# Patient Record
Sex: Female | Born: 1960 | Race: White | Hispanic: No | Marital: Married | Smoking: Former smoker
Health system: Southern US, Community
[De-identification: ages and names within clinical notes are randomized; demographics above are authoritative.]

## PROBLEM LIST (undated history)

## (undated) DIAGNOSIS — E079 Disorder of thyroid, unspecified: Secondary | ICD-10-CM

## (undated) DIAGNOSIS — F1010 Alcohol abuse, uncomplicated: Secondary | ICD-10-CM

## (undated) DIAGNOSIS — R5382 Chronic fatigue, unspecified: Secondary | ICD-10-CM

## (undated) DIAGNOSIS — K219 Gastro-esophageal reflux disease without esophagitis: Secondary | ICD-10-CM

## (undated) DIAGNOSIS — N958 Other specified menopausal and perimenopausal disorders: Secondary | ICD-10-CM

## (undated) DIAGNOSIS — J45909 Unspecified asthma, uncomplicated: Secondary | ICD-10-CM

## (undated) DIAGNOSIS — M797 Fibromyalgia: Secondary | ICD-10-CM

## (undated) DIAGNOSIS — E785 Hyperlipidemia, unspecified: Secondary | ICD-10-CM

## (undated) DIAGNOSIS — G4730 Sleep apnea, unspecified: Secondary | ICD-10-CM

## (undated) DIAGNOSIS — L930 Discoid lupus erythematosus: Secondary | ICD-10-CM

## (undated) DIAGNOSIS — G9332 Myalgic encephalomyelitis/chronic fatigue syndrome: Secondary | ICD-10-CM

## (undated) HISTORY — DX: Chronic fatigue, unspecified: R53.82

## (undated) HISTORY — DX: Unspecified asthma, uncomplicated: J45.909

## (undated) HISTORY — PX: OTHER SURGICAL HISTORY: SHX169

## (undated) HISTORY — PX: NASAL SEPTUM SURGERY: SHX37

## (undated) HISTORY — DX: Discoid lupus erythematosus: L93.0

## (undated) HISTORY — PX: TUBAL LIGATION: SHX77

## (undated) HISTORY — DX: Sleep apnea, unspecified: G47.30

## (undated) HISTORY — DX: Disorder of thyroid, unspecified: E07.9

## (undated) HISTORY — DX: Other specified menopausal and perimenopausal disorders: N95.8

## (undated) HISTORY — DX: Gastro-esophageal reflux disease without esophagitis: K21.9

## (undated) HISTORY — DX: Myalgic encephalomyelitis/chronic fatigue syndrome: G93.32

## (undated) HISTORY — DX: Alcohol abuse, uncomplicated: F10.10

## (undated) HISTORY — DX: Hyperlipidemia, unspecified: E78.5

## (undated) HISTORY — DX: Fibromyalgia: M79.7

---

## 2005-10-19 ENCOUNTER — Ambulatory Visit (HOSPITAL_COMMUNITY): Payer: Self-pay | Admitting: Psychiatry

## 2006-01-04 ENCOUNTER — Ambulatory Visit (HOSPITAL_COMMUNITY): Payer: Self-pay | Admitting: Psychiatry

## 2006-01-20 ENCOUNTER — Ambulatory Visit (HOSPITAL_COMMUNITY): Payer: Self-pay | Admitting: Psychiatry

## 2006-03-22 ENCOUNTER — Ambulatory Visit (HOSPITAL_COMMUNITY): Payer: Self-pay | Admitting: Psychiatry

## 2006-04-06 ENCOUNTER — Ambulatory Visit: Payer: Self-pay | Admitting: Family Medicine

## 2006-04-06 DIAGNOSIS — M797 Fibromyalgia: Secondary | ICD-10-CM | POA: Insufficient documentation

## 2006-04-13 ENCOUNTER — Telehealth: Payer: Self-pay | Admitting: Family Medicine

## 2006-04-17 ENCOUNTER — Ambulatory Visit: Payer: Self-pay | Admitting: Family Medicine

## 2006-04-17 DIAGNOSIS — N329 Bladder disorder, unspecified: Secondary | ICD-10-CM | POA: Insufficient documentation

## 2006-04-17 DIAGNOSIS — K644 Residual hemorrhoidal skin tags: Secondary | ICD-10-CM | POA: Insufficient documentation

## 2006-04-17 DIAGNOSIS — N8110 Cystocele, unspecified: Secondary | ICD-10-CM | POA: Insufficient documentation

## 2006-04-18 ENCOUNTER — Telehealth (INDEPENDENT_AMBULATORY_CARE_PROVIDER_SITE_OTHER): Payer: Self-pay | Admitting: *Deleted

## 2006-04-18 ENCOUNTER — Encounter: Payer: Self-pay | Admitting: Family Medicine

## 2006-04-18 LAB — CONVERTED CEMR LAB: Trich, Wet Prep: NONE SEEN

## 2006-04-19 ENCOUNTER — Ambulatory Visit (HOSPITAL_COMMUNITY): Payer: Self-pay | Admitting: Psychiatry

## 2006-04-21 DIAGNOSIS — E039 Hypothyroidism, unspecified: Secondary | ICD-10-CM | POA: Insufficient documentation

## 2006-04-21 DIAGNOSIS — F3289 Other specified depressive episodes: Secondary | ICD-10-CM | POA: Insufficient documentation

## 2006-04-21 DIAGNOSIS — F329 Major depressive disorder, single episode, unspecified: Secondary | ICD-10-CM

## 2006-05-03 ENCOUNTER — Encounter: Payer: Self-pay | Admitting: Family Medicine

## 2006-05-05 ENCOUNTER — Telehealth: Payer: Self-pay | Admitting: Family Medicine

## 2006-05-12 ENCOUNTER — Telehealth: Payer: Self-pay | Admitting: Family Medicine

## 2006-05-30 ENCOUNTER — Telehealth (INDEPENDENT_AMBULATORY_CARE_PROVIDER_SITE_OTHER): Payer: Self-pay | Admitting: *Deleted

## 2006-06-01 ENCOUNTER — Ambulatory Visit: Payer: Self-pay | Admitting: Family Medicine

## 2006-06-01 DIAGNOSIS — F1011 Alcohol abuse, in remission: Secondary | ICD-10-CM | POA: Insufficient documentation

## 2006-06-01 LAB — CONVERTED CEMR LAB
BUN: 15 mg/dL (ref 6–23)
Calcium: 9.4 mg/dL (ref 8.4–10.5)
Folate: 11.4 ng/mL
Glucose, Bld: 98 mg/dL (ref 70–99)
MCHC: 34.5 g/dL (ref 30.0–36.0)
MCV: 98.7 fL (ref 78.0–100.0)
Potassium: 4.4 meq/L (ref 3.5–5.3)
RBC: 4.58 M/uL (ref 3.87–5.11)
RDW: 14.3 % — ABNORMAL HIGH (ref 11.5–14.0)
Sodium: 140 meq/L (ref 135–145)
TSH: 13.662 microintl units/mL — ABNORMAL HIGH (ref 0.350–5.50)

## 2006-06-05 ENCOUNTER — Telehealth: Payer: Self-pay | Admitting: Family Medicine

## 2006-06-05 ENCOUNTER — Encounter: Payer: Self-pay | Admitting: Family Medicine

## 2006-06-05 LAB — CONVERTED CEMR LAB: Vitamin B-12: 217 pg/mL (ref 211–911)

## 2006-06-06 ENCOUNTER — Telehealth (INDEPENDENT_AMBULATORY_CARE_PROVIDER_SITE_OTHER): Payer: Self-pay | Admitting: *Deleted

## 2006-06-15 ENCOUNTER — Telehealth (INDEPENDENT_AMBULATORY_CARE_PROVIDER_SITE_OTHER): Payer: Self-pay | Admitting: *Deleted

## 2006-06-16 ENCOUNTER — Ambulatory Visit: Payer: Self-pay | Admitting: Family Medicine

## 2006-06-16 DIAGNOSIS — E538 Deficiency of other specified B group vitamins: Secondary | ICD-10-CM | POA: Insufficient documentation

## 2006-08-04 ENCOUNTER — Ambulatory Visit: Payer: Self-pay | Admitting: Family Medicine

## 2006-08-05 ENCOUNTER — Encounter: Payer: Self-pay | Admitting: Family Medicine

## 2006-08-07 ENCOUNTER — Telehealth: Payer: Self-pay | Admitting: Family Medicine

## 2006-08-07 ENCOUNTER — Telehealth (INDEPENDENT_AMBULATORY_CARE_PROVIDER_SITE_OTHER): Payer: Self-pay | Admitting: *Deleted

## 2006-08-29 ENCOUNTER — Encounter: Payer: Self-pay | Admitting: Family Medicine

## 2006-09-12 ENCOUNTER — Encounter: Payer: Self-pay | Admitting: Family Medicine

## 2006-09-19 ENCOUNTER — Encounter: Payer: Self-pay | Admitting: Family Medicine

## 2006-09-22 ENCOUNTER — Ambulatory Visit: Payer: Self-pay | Admitting: Family Medicine

## 2006-09-22 LAB — CONVERTED CEMR LAB
Blood in Urine, dipstick: NEGATIVE
Ketones, urine, test strip: NEGATIVE
Nitrite: NEGATIVE
Urobilinogen, UA: NEGATIVE
WBC Urine, dipstick: NEGATIVE

## 2006-09-26 ENCOUNTER — Inpatient Hospital Stay (HOSPITAL_COMMUNITY): Admission: RE | Admit: 2006-09-26 | Discharge: 2006-09-28 | Payer: Self-pay | Admitting: Obstetrics and Gynecology

## 2006-09-26 ENCOUNTER — Encounter (INDEPENDENT_AMBULATORY_CARE_PROVIDER_SITE_OTHER): Payer: Self-pay | Admitting: Obstetrics and Gynecology

## 2006-10-02 ENCOUNTER — Telehealth (INDEPENDENT_AMBULATORY_CARE_PROVIDER_SITE_OTHER): Payer: Self-pay | Admitting: *Deleted

## 2006-10-24 ENCOUNTER — Encounter: Payer: Self-pay | Admitting: Family Medicine

## 2006-10-24 ENCOUNTER — Inpatient Hospital Stay (HOSPITAL_COMMUNITY): Admission: AD | Admit: 2006-10-24 | Discharge: 2006-10-24 | Payer: Self-pay | Admitting: Radiology

## 2006-10-24 LAB — CONVERTED CEMR LAB
ALT: 53 units/L
AST: 50 units/L
Albumin: 3.7 g/dL
Alkaline Phosphatase: 70 units/L
Calcium: 9.5 mg/dL
Chloride: 105 meq/L
HCT: 41.1 %
Platelets: 205 10*3/uL
Potassium: 4.5 meq/L
Sodium: 139 meq/L
Total Protein: 6.5 g/dL
WBC, blood: 6.2 10*3/uL

## 2006-10-27 ENCOUNTER — Ambulatory Visit: Payer: Self-pay | Admitting: Family Medicine

## 2006-10-28 ENCOUNTER — Encounter: Payer: Self-pay | Admitting: Family Medicine

## 2006-10-28 LAB — CONVERTED CEMR LAB
Trich, Wet Prep: NONE SEEN
Yeast Wet Prep HPF POC: NONE SEEN

## 2006-10-30 ENCOUNTER — Telehealth (INDEPENDENT_AMBULATORY_CARE_PROVIDER_SITE_OTHER): Payer: Self-pay | Admitting: *Deleted

## 2006-10-30 ENCOUNTER — Encounter: Payer: Self-pay | Admitting: Family Medicine

## 2006-10-31 ENCOUNTER — Encounter: Payer: Self-pay | Admitting: Family Medicine

## 2006-10-31 ENCOUNTER — Ambulatory Visit (HOSPITAL_COMMUNITY): Payer: Self-pay | Admitting: Psychiatry

## 2006-11-07 ENCOUNTER — Telehealth (INDEPENDENT_AMBULATORY_CARE_PROVIDER_SITE_OTHER): Payer: Self-pay | Admitting: *Deleted

## 2006-11-13 ENCOUNTER — Telehealth (INDEPENDENT_AMBULATORY_CARE_PROVIDER_SITE_OTHER): Payer: Self-pay | Admitting: *Deleted

## 2006-11-24 ENCOUNTER — Ambulatory Visit: Payer: Self-pay | Admitting: Family Medicine

## 2006-11-27 ENCOUNTER — Ambulatory Visit (HOSPITAL_COMMUNITY): Payer: Self-pay | Admitting: Psychiatry

## 2006-12-13 ENCOUNTER — Encounter: Payer: Self-pay | Admitting: Family Medicine

## 2006-12-18 ENCOUNTER — Encounter: Payer: Self-pay | Admitting: Family Medicine

## 2006-12-20 ENCOUNTER — Ambulatory Visit: Payer: Self-pay | Admitting: Family Medicine

## 2006-12-21 ENCOUNTER — Ambulatory Visit (HOSPITAL_COMMUNITY): Payer: Self-pay | Admitting: Psychiatry

## 2007-01-16 ENCOUNTER — Encounter: Payer: Self-pay | Admitting: Family Medicine

## 2007-01-18 ENCOUNTER — Encounter: Admission: RE | Admit: 2007-01-18 | Discharge: 2007-01-18 | Payer: Self-pay | Admitting: Obstetrics and Gynecology

## 2007-01-18 ENCOUNTER — Ambulatory Visit (HOSPITAL_COMMUNITY): Payer: Self-pay | Admitting: Psychiatry

## 2007-01-18 LAB — HM MAMMOGRAPHY

## 2007-01-24 ENCOUNTER — Encounter: Payer: Self-pay | Admitting: Family Medicine

## 2007-02-13 ENCOUNTER — Encounter: Payer: Self-pay | Admitting: Family Medicine

## 2007-02-14 ENCOUNTER — Encounter: Payer: Self-pay | Admitting: Family Medicine

## 2007-02-20 ENCOUNTER — Telehealth: Payer: Self-pay | Admitting: Family Medicine

## 2007-02-20 ENCOUNTER — Telehealth (INDEPENDENT_AMBULATORY_CARE_PROVIDER_SITE_OTHER): Payer: Self-pay | Admitting: *Deleted

## 2007-02-21 ENCOUNTER — Ambulatory Visit (HOSPITAL_COMMUNITY): Payer: Self-pay | Admitting: Psychiatry

## 2007-02-23 ENCOUNTER — Telehealth: Payer: Self-pay | Admitting: Family Medicine

## 2007-03-19 ENCOUNTER — Encounter: Payer: Self-pay | Admitting: Family Medicine

## 2007-04-12 ENCOUNTER — Encounter: Payer: Self-pay | Admitting: Family Medicine

## 2007-04-12 DIAGNOSIS — L659 Nonscarring hair loss, unspecified: Secondary | ICD-10-CM | POA: Insufficient documentation

## 2007-04-18 ENCOUNTER — Ambulatory Visit (HOSPITAL_COMMUNITY): Payer: Self-pay | Admitting: Psychiatry

## 2007-05-18 ENCOUNTER — Telehealth (INDEPENDENT_AMBULATORY_CARE_PROVIDER_SITE_OTHER): Payer: Self-pay | Admitting: *Deleted

## 2007-05-29 ENCOUNTER — Telehealth: Payer: Self-pay | Admitting: Family Medicine

## 2007-06-22 ENCOUNTER — Telehealth: Payer: Self-pay | Admitting: Family Medicine

## 2007-06-25 ENCOUNTER — Ambulatory Visit: Payer: Self-pay | Admitting: Family Medicine

## 2007-06-25 ENCOUNTER — Encounter: Admission: RE | Admit: 2007-06-25 | Discharge: 2007-06-25 | Payer: Self-pay | Admitting: Family Medicine

## 2007-06-25 DIAGNOSIS — J4489 Other specified chronic obstructive pulmonary disease: Secondary | ICD-10-CM | POA: Insufficient documentation

## 2007-06-25 DIAGNOSIS — J449 Chronic obstructive pulmonary disease, unspecified: Secondary | ICD-10-CM | POA: Insufficient documentation

## 2007-06-26 ENCOUNTER — Ambulatory Visit (HOSPITAL_COMMUNITY): Payer: Self-pay | Admitting: Psychiatry

## 2007-07-02 ENCOUNTER — Ambulatory Visit: Payer: Self-pay | Admitting: Family Medicine

## 2007-07-03 ENCOUNTER — Encounter: Admission: RE | Admit: 2007-07-03 | Discharge: 2007-07-03 | Payer: Self-pay | Admitting: Family Medicine

## 2007-07-18 ENCOUNTER — Ambulatory Visit (HOSPITAL_COMMUNITY): Payer: Self-pay | Admitting: Psychiatry

## 2007-09-04 HISTORY — PX: ABDOMINAL HYSTERECTOMY: SHX81

## 2007-09-13 ENCOUNTER — Telehealth: Payer: Self-pay | Admitting: Family Medicine

## 2007-09-21 ENCOUNTER — Ambulatory Visit (HOSPITAL_COMMUNITY): Payer: Self-pay | Admitting: Psychiatry

## 2007-10-25 ENCOUNTER — Ambulatory Visit: Payer: Self-pay | Admitting: Family Medicine

## 2007-11-20 ENCOUNTER — Telehealth: Payer: Self-pay | Admitting: Family Medicine

## 2007-11-20 ENCOUNTER — Encounter: Payer: Self-pay | Admitting: Family Medicine

## 2007-11-21 ENCOUNTER — Telehealth: Payer: Self-pay | Admitting: Family Medicine

## 2008-03-12 ENCOUNTER — Ambulatory Visit: Payer: Self-pay | Admitting: Family Medicine

## 2008-03-12 ENCOUNTER — Encounter: Admission: RE | Admit: 2008-03-12 | Discharge: 2008-03-12 | Payer: Self-pay | Admitting: Family Medicine

## 2008-03-12 ENCOUNTER — Ambulatory Visit (HOSPITAL_COMMUNITY): Payer: Self-pay | Admitting: Psychiatry

## 2008-04-15 ENCOUNTER — Telehealth (INDEPENDENT_AMBULATORY_CARE_PROVIDER_SITE_OTHER): Payer: Self-pay | Admitting: *Deleted

## 2008-04-16 ENCOUNTER — Ambulatory Visit: Payer: Self-pay | Admitting: Family Medicine

## 2008-04-17 ENCOUNTER — Encounter: Payer: Self-pay | Admitting: Family Medicine

## 2008-04-17 LAB — CONVERTED CEMR LAB
ALT: 14 units/L (ref 0–35)
BUN: 11 mg/dL (ref 6–23)
Basophils Relative: 2 % — ABNORMAL HIGH (ref 0–1)
CO2: 22 meq/L (ref 19–32)
Calcium: 9.6 mg/dL (ref 8.4–10.5)
Chloride: 107 meq/L (ref 96–112)
Creatinine, Ser: 0.95 mg/dL (ref 0.40–1.20)
Eosinophils Absolute: 0.1 10*3/uL (ref 0.0–0.7)
Eosinophils Relative: 1 % (ref 0–5)
HCT: 42.3 % (ref 36.0–46.0)
MCHC: 35 g/dL (ref 30.0–36.0)
MCV: 91.4 fL (ref 78.0–100.0)
Monocytes Relative: 6 % (ref 3–12)
Neutrophils Relative %: 63 % (ref 43–77)
Platelets: 235 10*3/uL (ref 150–400)
TSH: 52.31 microintl units/mL — ABNORMAL HIGH (ref 0.350–4.500)
Total Bilirubin: 0.4 mg/dL (ref 0.3–1.2)

## 2008-04-22 ENCOUNTER — Telehealth: Payer: Self-pay | Admitting: Family Medicine

## 2008-04-23 ENCOUNTER — Encounter: Payer: Self-pay | Admitting: Family Medicine

## 2008-04-29 ENCOUNTER — Encounter: Payer: Self-pay | Admitting: Family Medicine

## 2008-04-30 ENCOUNTER — Encounter: Payer: Self-pay | Admitting: Family Medicine

## 2008-05-06 ENCOUNTER — Encounter: Payer: Self-pay | Admitting: Family Medicine

## 2008-05-13 ENCOUNTER — Telehealth: Payer: Self-pay | Admitting: Family Medicine

## 2008-06-06 ENCOUNTER — Encounter: Payer: Self-pay | Admitting: Family Medicine

## 2008-07-01 ENCOUNTER — Ambulatory Visit (HOSPITAL_COMMUNITY): Payer: Self-pay | Admitting: Psychiatry

## 2008-07-02 ENCOUNTER — Telehealth (INDEPENDENT_AMBULATORY_CARE_PROVIDER_SITE_OTHER): Payer: Self-pay | Admitting: *Deleted

## 2008-07-14 ENCOUNTER — Ambulatory Visit: Payer: Self-pay | Admitting: Family Medicine

## 2008-07-22 ENCOUNTER — Encounter: Payer: Self-pay | Admitting: Family Medicine

## 2008-07-25 ENCOUNTER — Telehealth (INDEPENDENT_AMBULATORY_CARE_PROVIDER_SITE_OTHER): Payer: Self-pay | Admitting: *Deleted

## 2008-08-26 ENCOUNTER — Ambulatory Visit (HOSPITAL_COMMUNITY): Payer: Self-pay | Admitting: Psychiatry

## 2008-10-21 ENCOUNTER — Encounter: Payer: Self-pay | Admitting: Family Medicine

## 2008-11-05 ENCOUNTER — Ambulatory Visit: Payer: Self-pay | Admitting: Family Medicine

## 2008-11-05 DIAGNOSIS — M2550 Pain in unspecified joint: Secondary | ICD-10-CM | POA: Insufficient documentation

## 2008-11-05 DIAGNOSIS — R030 Elevated blood-pressure reading, without diagnosis of hypertension: Secondary | ICD-10-CM | POA: Insufficient documentation

## 2008-11-05 DIAGNOSIS — N390 Urinary tract infection, site not specified: Secondary | ICD-10-CM | POA: Insufficient documentation

## 2008-11-05 LAB — CONVERTED CEMR LAB
Glucose, Urine, Semiquant: NEGATIVE
Ketones, urine, test strip: NEGATIVE
Specific Gravity, Urine: 1.02
Urobilinogen, UA: 0.2
pH: 5.5

## 2008-11-06 ENCOUNTER — Encounter: Payer: Self-pay | Admitting: Family Medicine

## 2008-11-11 ENCOUNTER — Telehealth: Payer: Self-pay | Admitting: Family Medicine

## 2008-11-12 LAB — CONVERTED CEMR LAB
Basophils Absolute: 0.1 10*3/uL (ref 0.0–0.1)
Eosinophils Relative: 1 % (ref 0–5)
HCT: 46.4 % — ABNORMAL HIGH (ref 36.0–46.0)
Hemoglobin: 15.7 g/dL — ABNORMAL HIGH (ref 12.0–15.0)
Lymphocytes Relative: 19 % (ref 12–46)
Lymphs Abs: 1.4 10*3/uL (ref 0.7–4.0)
Monocytes Absolute: 0.5 10*3/uL (ref 0.1–1.0)
Monocytes Relative: 6 % (ref 3–12)
Neutro Abs: 5.5 10*3/uL (ref 1.7–7.7)
RBC: 4.82 M/uL (ref 3.87–5.11)
Rhuematoid fact SerPl-aCnc: 22 intl units/mL — ABNORMAL HIGH (ref 0–20)
Sed Rate: 9 mm/hr (ref 0–22)
WBC: 7.6 10*3/uL (ref 4.0–10.5)
ds DNA Ab: <1 (ref <5)

## 2008-11-18 ENCOUNTER — Ambulatory Visit (HOSPITAL_COMMUNITY): Payer: Self-pay | Admitting: Psychiatry

## 2008-12-15 ENCOUNTER — Encounter: Payer: Self-pay | Admitting: Family Medicine

## 2009-01-30 ENCOUNTER — Ambulatory Visit (HOSPITAL_COMMUNITY): Payer: Self-pay | Admitting: Psychiatry

## 2009-02-18 ENCOUNTER — Ambulatory Visit: Payer: Self-pay | Admitting: Family Medicine

## 2009-02-18 DIAGNOSIS — H9190 Unspecified hearing loss, unspecified ear: Secondary | ICD-10-CM | POA: Insufficient documentation

## 2009-02-18 DIAGNOSIS — E559 Vitamin D deficiency, unspecified: Secondary | ICD-10-CM | POA: Insufficient documentation

## 2009-02-19 LAB — CONVERTED CEMR LAB
TSH: 0.289 microintl units/mL — ABNORMAL LOW (ref 0.350–4.500)
Vit D, 25-Hydroxy: 26 ng/mL — ABNORMAL LOW (ref 30–89)
Vitamin B-12: 474 pg/mL (ref 211–911)

## 2009-04-01 ENCOUNTER — Ambulatory Visit: Payer: Self-pay | Admitting: Family Medicine

## 2009-04-01 ENCOUNTER — Ambulatory Visit (HOSPITAL_COMMUNITY): Payer: Self-pay | Admitting: Psychiatry

## 2009-05-25 ENCOUNTER — Telehealth (INDEPENDENT_AMBULATORY_CARE_PROVIDER_SITE_OTHER): Payer: Self-pay | Admitting: *Deleted

## 2009-06-05 ENCOUNTER — Telehealth: Payer: Self-pay | Admitting: Family Medicine

## 2009-06-10 ENCOUNTER — Encounter: Payer: Self-pay | Admitting: Family Medicine

## 2009-06-24 ENCOUNTER — Ambulatory Visit: Payer: Self-pay | Admitting: Emergency Medicine

## 2009-06-24 DIAGNOSIS — M25579 Pain in unspecified ankle and joints of unspecified foot: Secondary | ICD-10-CM | POA: Insufficient documentation

## 2009-06-24 DIAGNOSIS — S82409A Unspecified fracture of shaft of unspecified fibula, initial encounter for closed fracture: Secondary | ICD-10-CM | POA: Insufficient documentation

## 2009-06-24 DIAGNOSIS — S93409A Sprain of unspecified ligament of unspecified ankle, initial encounter: Secondary | ICD-10-CM | POA: Insufficient documentation

## 2009-07-02 ENCOUNTER — Ambulatory Visit (HOSPITAL_BASED_OUTPATIENT_CLINIC_OR_DEPARTMENT_OTHER): Admission: RE | Admit: 2009-07-02 | Discharge: 2009-07-02 | Payer: Self-pay | Admitting: Orthopedic Surgery

## 2009-07-08 ENCOUNTER — Ambulatory Visit: Payer: Self-pay

## 2009-07-08 ENCOUNTER — Encounter: Payer: Self-pay | Admitting: Cardiovascular Disease

## 2009-07-08 DIAGNOSIS — M79609 Pain in unspecified limb: Secondary | ICD-10-CM | POA: Insufficient documentation

## 2009-08-17 ENCOUNTER — Telehealth (INDEPENDENT_AMBULATORY_CARE_PROVIDER_SITE_OTHER): Payer: Self-pay | Admitting: *Deleted

## 2009-11-16 ENCOUNTER — Ambulatory Visit (HOSPITAL_COMMUNITY): Payer: Self-pay | Admitting: Psychiatry

## 2009-11-19 ENCOUNTER — Encounter: Admission: RE | Admit: 2009-11-19 | Discharge: 2009-11-19 | Payer: Self-pay | Admitting: Family Medicine

## 2009-11-19 ENCOUNTER — Ambulatory Visit: Payer: Self-pay | Admitting: Family Medicine

## 2009-11-19 DIAGNOSIS — R05 Cough: Secondary | ICD-10-CM | POA: Insufficient documentation

## 2009-11-19 DIAGNOSIS — R062 Wheezing: Secondary | ICD-10-CM | POA: Insufficient documentation

## 2009-11-19 DIAGNOSIS — R059 Cough, unspecified: Secondary | ICD-10-CM | POA: Insufficient documentation

## 2009-11-19 DIAGNOSIS — J069 Acute upper respiratory infection, unspecified: Secondary | ICD-10-CM | POA: Insufficient documentation

## 2009-11-25 ENCOUNTER — Telehealth: Payer: Self-pay | Admitting: Family Medicine

## 2009-11-30 ENCOUNTER — Encounter: Payer: Self-pay | Admitting: Family Medicine

## 2010-01-06 ENCOUNTER — Ambulatory Visit (HOSPITAL_COMMUNITY)
Admission: RE | Admit: 2010-01-06 | Discharge: 2010-01-06 | Payer: Self-pay | Source: Home / Self Care | Attending: Psychiatry | Admitting: Psychiatry

## 2010-02-02 NOTE — Progress Notes (Signed)
Summary: Support group  Phone Note Call from Patient   Caller: Patient Summary of Call: Pt states she would like info on joining a support group bc she needs help dealing w/ her bipolar and suicidal daughter. Do you have any suggestions? Initial call taken by: Payton Spark CMA,  August 17, 2009 2:47 PM  Follow-up for Phone Call        can you ask downstairs at behavioral health? Follow-up by: Seymour Bars DO,  August 17, 2009 3:15 PM  Additional Follow-up for Phone Call Additional follow up Details #1::        BH downstairs will be happy to see Pt for counseling, coping skills and other issues.   I called Pt to inform her of the above. Pt states she will wait to see if she can find a actual group of other parents she can meet with. Pt will CB if she needs further help. Additional Follow-up by: Payton Spark CMA,  August 17, 2009 3:29 PM

## 2010-02-02 NOTE — Assessment & Plan Note (Signed)
Summary: INJURY TO ANKLE/KH   Vital Signs:  Patient Profile:   50 Years Old Female CC:      Rt ankle pain from fall down stairs last night Height:     65 inches Weight:      182 pounds O2 Sat:      94 % O2 treatment:    Room Air Temp:     98.3 degrees F oral Pulse rate:   85 / minute Pulse rhythm:   regular Resp:     18 per minute BP sitting:   135 / 84  (right arm) Cuff size:   regular  Pt. in pain?   yes    Location:   ankle    Intensity:   7    Type:       sharp  Vitals Entered By: Emilio Math (June 24, 2009 12:34 PM)                   Current Allergies (reviewed today): ! VICODINHistory of Present Illness Chief Complaint: Rt ankle pain from fall down stairs last night History of Present Illness: Larey Seat down 3 stairs last night.  Doesn't recall how her ankle twisted, but she had to lay there 15 minutes before being helped back to bed by her husband.  She used ice which she believes has decreased the swelling.  Right lateral ankle swelling and bruising.  She is able to bear weight on it now. Throbbing dull pain.  Cannot take narcotics nor medicines with Tylenol.  Has taken Tramadol before.  Current Meds CLONAZEPAM 1 MG TABS (CLONAZEPAM) one by mouth two times a day SYNTHROID 150 MCG  TABS (LEVOTHYROXINE SODIUM) 1 tab by mouth daily TRAZODONE HCL 50 MG TABS (TRAZODONE HCL) Take 1 tablet by mouth once a day at bedtime PAROXETINE HCL 40 MG TABS (PAROXETINE HCL) Take 1/2 tab daily * FOLIC ACID  ESTRADIOL 0.1 MG/24HR PTWK (ESTRADIOL) Take 1 tab by mouth daily LAMOTRIGINE 25 MG TABS (LAMOTRIGINE) Take 2 tabs every morning and 4 tabs every night GABAPENTIN 300 MG CAPS (GABAPENTIN) Take 4 caps by mouth once daily VERAMYST 27.5 MCG/SPRAY SUSP (FLUTICASONE FUROATE) 2 sprays/ nostril once a day TRAMADOL HCL 50 MG TABS (TRAMADOL HCL) 1 tab by mouth Q6 hours as needed pain  REVIEW OF SYSTEMS Constitutional Symptoms      Denies fever, chills, night sweats, weight loss, weight  gain, and fatigue.  Eyes       Denies change in vision, eye pain, eye discharge, glasses, contact lenses, and eye surgery. Ear/Nose/Throat/Mouth       Denies hearing loss/aids, change in hearing, ear pain, ear discharge, dizziness, frequent runny nose, frequent nose bleeds, sinus problems, sore throat, hoarseness, and tooth pain or bleeding.  Respiratory       Denies dry cough, productive cough, wheezing, shortness of breath, asthma, bronchitis, and emphysema/COPD.  Cardiovascular       Denies murmurs, chest pain, and tires easily with exhertion.    Gastrointestinal       Denies stomach pain, nausea/vomiting, diarrhea, constipation, blood in bowel movements, and indigestion. Genitourniary       Denies painful urination, kidney stones, and loss of urinary control. Neurological       Denies paralysis, seizures, and fainting/blackouts. Musculoskeletal       Complains of muscle pain, joint pain, joint stiffness, decreased range of motion, and swelling.      Denies redness, muscle weakness, and gout.  Skin       Complains  of bruising.      Denies unusual mles/lumps or sores and hair/skin or nail changes.  Psych       Denies mood changes, temper/anger issues, anxiety/stress, speech problems, depression, and sleep problems.  Past History:  Past Medical History: Reviewed history from 06/25/2007 and no changes required. 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 suicide attempts x 2- Dr Christell Constant, psych perimenopausal fibromyalgia- Dr deveswar-- not seeing anymorer, chiropractor CFS [phone] gyn : Dr Edward Jolly derm: Dr Isaac Laud -[facility], Dr Naoma Diener in [place] [facility]:  Previous alcholic.   Past Surgical History: Reviewed history from 10/27/2006 and no changes required. BTL uvuloplasty 1993 colonoscopy/EGD normal 2006 TAH/ bladder sling 9-08  Family History: Reviewed history from 04/06/2006 and no changes required. sister died at 70 from AMI father  alive, HTN brother alive, depression brother (younger) died, AIDs, comitted suicide mother died at 7 from colon cancer  Social History: Reviewed history from 04/21/2006 and no changes required. 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  Married, happily to [place]. 4 grown kids.  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 Physical Exam General appearance: well developed, well nourished, no acute distress Chest/Lungs: no rales, wheezes, or rhonchi bilateral, breath sounds equal without effort Heart: regular rate and  rhythm, no murmur Extremities: Right ankle: FROM, full strength, 2+ effusion and ecchymoses lateral ankle.  TTP ATFL, lat malleolus, and with squeezing of tib-fib.  No TTP med malleolus, prox fib, base of 5th, navicular, or calcaneus.   Skin: no obvious rashes or lesions Assessment New Problems: FRACTURE, FIBULA, RIGHT (ICD-823.81) ANKLE PAIN, RIGHT (ICD-719.47) SPRAIN/STRAIN, ANKLE NOS (ICD-845.00)  Xray: spiral R fibula fx  Plan New Medications/Changes: TRAMADOL HCL 50 MG TABS (TRAMADOL HCL) 1 tab by mouth Q6 hours as needed pain  #20 x 0, 06/24/2009, Hoyt Koch MD  New Orders: T-DG Ankle Complete*R* [14782] New Patient Level III [95621] Ace  Bandage < 3in. [H0865]  The patient and/or caregiver has been counseled thoroughly with regard to medications prescribed including dosage, schedule, interactions, rationale for use, and possible side effects and they verbalize understanding.  Diagnoses and expected course of recovery discussed and will return if not improved as expected or if the condition worsens. Patient and/or caregiver verbalized understanding.  Prescriptions: TRAMADOL HCL 50 MG TABS (TRAMADOL HCL) 1 tab by mouth Q6 hours as needed pain  #20 x 0   Entered and Authorized by:   Hoyt Koch MD   Signed by:   Hoyt Koch MD on 06/24/2009   Method used:   Printed  then faxed to ...       [facility]* (retail)       [address] [place], [place]  [address]       Ph: [phone]       Fax: [phone]   RxID:   [id number]   Patient Instructions: 1)  Rest, ice, compression, elevation 2)  Medication as instructed 3)  Avoid weight-bearing if at all possible 4)  Wear splint 5)  Follow up with Dr. Margaretha Sheffield tomorrow  Orders Added: 1)  T-DG Ankle Complete*R* [73610] 2)  New Patient Level III [99203] 3)  Ace  Bandage < 3in. [V5643]

## 2010-02-02 NOTE — Assessment & Plan Note (Signed)
Summary: BP check  Nurse Visit   Vital Signs:  Patient profile:   50 year old female O2 Sat:      96 % on Room air Pulse rate:   70 / minute BP sitting:   137 / 89  (left arm) Cuff size:   regular  Vitals Entered By: Payton Spark CMA (April 01, 2009 2:38 PM)  O2 Flow:  Room air  Allergies: 1)  ! Vicodin  Orders Added: 1)  Est. Patient Level I [19147]   CC: Follow-up HTN HPI: Taking meds? n/a Side effects? n/a Chest pain, SOB, Dizziness? no A/P: HTN (401.1) At goal? If no, Patient will be notified.  5 minutes was spent with patient.   Pt was seen by Dr. Christell Constant and sent up here for elevated BP. Pt denies chest pain, SOB, dizziness and any other Sxs. Pt feels fine but admits she has been more moody and on edge.

## 2010-02-02 NOTE — Progress Notes (Signed)
Summary: testing?  Phone Note Call from Patient   Caller: Patient Summary of Call: Pt states she gave a bleeding baby CPR and mouth to mouth. Pt was told by EMS that she should check w/ her PCP to find out if there is anything she should do since the baby's blood got into her mouth. Please advise. Initial call taken by: Payton Spark CMA,  May 25, 2009 2:37 PM  Follow-up for Phone Call        It would help if she new the baby's Hep C and HIV status.  If negative, she does not need any testing. Follow-up by: Seymour Bars DO,  May 25, 2009 2:51 PM  Additional Follow-up for Phone Call Additional follow up Details #1::        [facility] informing Pt of the above.  Additional Follow-up by: Payton Spark CMA,  May 25, 2009 3:05 PM

## 2010-02-02 NOTE — Progress Notes (Signed)
Summary: Rx for Veramyst  Phone Note Call from Patient Call back at Home Phone [phone]   Caller: Patient Call For: Jennifer Bars DO Summary of Call: Pt called the sample of Veramyst you gave her is workling and would like a rx sent to [facility] in [place] Initial call taken by: Kathlene November,  June 05, 2009 11:48 AM    New/Updated Medications: VERAMYST 27.5 MCG/SPRAY SUSP (FLUTICASONE FUROATE) 2 sprays/ nostril once a day Prescriptions: VERAMYST 27.5 MCG/SPRAY SUSP (FLUTICASONE FUROATE) 2 sprays/ nostril once a day  #1 bottle x 3   Entered and Authorized by:   Jennifer Bars DO   Signed by:   Jennifer Bars DO on 06/05/2009   Method used:   Electronically to        [facility]* (retail)       [address] [place], [place]  [address]       Ph: [phone]       Fax: [phone]   RxID:   [id number]

## 2010-02-02 NOTE — Miscellaneous (Signed)
Summary: Orders Update  Clinical Lists Changes  Problems: Added new problem of LEG PAIN, RIGHT (ICD-729.5) Orders: Added new Test order of Venous Duplex Lower Extremity (Venous Duplex Lower) - Signed 

## 2010-02-02 NOTE — Letter (Signed)
Summary: [facility]  [facility]   Imported By: Maryln Gottron 12/11/2009 13:06:30  _____________________________________________________________________  External Attachment:    Type:   Image     Comment:   External Document

## 2010-02-02 NOTE — Letter (Signed)
Summary: Records Dated 06-06-06 thru 10-13-09/[facility]   Records Dated 06-06-06 thru 10-13-09/[facility]   Imported By: Lanelle Bal 12/08/2009 14:07:25  _____________________________________________________________________  External Attachment:    Type:   Image     Comment:   External Document

## 2010-02-02 NOTE — Assessment & Plan Note (Signed)
Summary: URI with wheezing   Vital Signs:  Patient profile:   50 year old female Height:      65 inches Weight:      190 pounds BMI:     31.73 O2 Sat:      94 % on Room air Temp:     98.3 degrees F oral Pulse rate:   78 / minute BP sitting:   142 / 85  (left arm) Cuff size:   regular  Vitals Entered By: Payton Spark CMA (November 19, 2009 9:54 AM)  O2 Flow:  Room air CC: Chest congestion and cough x 2 weeks.   Primary Care Provider:  Seymour Bars D.O.  CC:  Chest congestion and cough x 2 weeks.Marland Kitchen  History of Present Illness: 50 yo WF presents for 2 wks of cold symptoms that started iwth sore throat and runny nose and now has chest congestion and cough.  She sometimes coughs up phlegm.  She is still smoking.  Denies fevers or chills.  She has some facial pressure nd HAs.    She is taking Mucinex DM which helps some.  The cough keeps her up at night.  She feels SOB.   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  She has been sent to [facility] from her regular dermatologist.    Current Medications (verified): 1)  Clonazepam 1 Mg Tabs (Clonazepam) .... One By Mouth Two Times A Day 2)  Synthroid 150 Mcg  Tabs (Levothyroxine Sodium) .Marland Kitchen.. 1 Tab By Mouth Daily 3)  Paroxetine Hcl 40 Mg Tabs (Paroxetine Hcl) .... Take 1/2 Tab Daily 4)  Folic Acid 5)  Estradiol 0.1 Mg/24hr Ptwk (Estradiol) .... Take 1 Tab By Mouth Daily 6)  Lamotrigine 25 Mg Tabs (Lamotrigine) .... Take 2 Tabs Every Morning and 4 Tabs Every Night 7)  Gabapentin 300 Mg Caps (Gabapentin) .... Take 4 Caps By Mouth Once Daily 8)  Methotrexate 2.5 Mg Tabs (Methotrexate Sodium) .... Take 6 Tabs Weekly 9)  Metrogel 1 % Gel (Metronidazole) 10)  Paxil 20 Mg Tabs (Paroxetine Hcl) .... Take 1 Tab By Mouth Once Daily  Allergies (verified): 1)  ! 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 suicide attempts x 2- Dr Christell Constant,  psych perimenopausal fibromyalgia- Dr Erlene Senters-- not seeing anymorer, chiropractor CFS G5P4 PFTs normal 6-09 gyn : Dr Edward Jolly derm: Dr Isaac Laud -[facility], Dr Naoma Diener in [place] [facility]:  Previous alcholic.   Physical Exam  General:  alert, well-developed, well-nourished, and well-hydrated.   Head:  normocephalic and atraumatic.  sinuses NTTP Eyes:  conjunctiva clear Ears:  EACs patent; TMs translucent and gray with good cone of light and bony landmarks.  Nose:  nasal congestion present Mouth:  o/p mildly injected Neck:  no masses.   Lungs:  diffuse exp wheezing, rhonchi with cough, nonlabored Heart:  RRR w/o M Skin:  color normal and no rashes.   Cervical Nodes:  shotty anterior cervical chain LA   Impression & Recommendations:  Problem # 1:  UPPER RESPIRATORY INFECTION, ACUTE, WITH BRONCHITIS (ICD-465.9) URI, now with bronchitis and wheezing in the current smoker with hx of normal PFTs 6-09. will treat with Zithromax since she is on MTX for her lupus. Will add RX cough syrup, Qvar and ProAir x 2-3 wks for wheezing. Avoid smoking.  Depo Medrol injection given today.  Hold MTX while on abx. Call if not improved after 10 days. REpeat  PFTs next visit. Her updated medication list for this problem includes:    Promethazine Vc/codeine 6.25-5-10 Mg/8ml Syrp (Phenyleph-promethazine-cod) .Marland KitchenMarland KitchenMarland KitchenMarland Kitchen 5 ml by mouth q 6 hrs as needed cough  Complete Medication List: 1)  Clonazepam 1 Mg Tabs (Clonazepam) .... One by mouth two times a day 2)  Synthroid 150 Mcg Tabs (Levothyroxine sodium) .Marland Kitchen.. 1 tab by mouth daily 3)  Paroxetine Hcl 40 Mg Tabs (Paroxetine hcl) .... Take 1/2 tab daily 4)  Folic Acid  5)  Estradiol 0.1 Mg/24hr Ptwk (Estradiol) .... Take 1 tab by mouth daily 6)  Lamotrigine 25 Mg Tabs (Lamotrigine) .... Take 2 tabs every morning and 4 tabs every night 7)  Gabapentin 300 Mg Caps (Gabapentin) .... Take 4 caps by mouth once daily 8)  Methotrexate 2.5 Mg Tabs (Methotrexate sodium)  .... Take 6 tabs weekly 9)  Metrogel 1 % Gel (Metronidazole) 10)  Paxil 20 Mg Tabs (Paroxetine hcl) .... Take 1 tab by mouth once daily 11)  Proair Hfa 108 (90 Base) Mcg/act Aers (Albuterol sulfate) .... 2 puffs q 4 hrs as needed shortness of breath 12)  Qvar 40 Mcg/act Aers (Beclomethasone dipropionate) .Marland Kitchen.. 1 puff twice a day (rinse mouth out after each use) 13)  Azithromycin 250 Mg Tabs (Azithromycin) .... 2 tabs by mouth x 1 day then 1 tab by mouth daily x 4 days 14)  Promethazine Vc/codeine 6.25-5-10 Mg/51ml Syrp (Phenyleph-promethazine-cod) .... 5 ml by mouth q 6 hrs as needed cough  Other Orders: T-DG Chest 2 View (04540) Depo- Medrol 80mg  (J1040) Admin of Therapeutic Inj  intramuscular or subcutaneous (98119)  Patient Instructions: 1)  For BRONCHITIS WITH WHEEZING: 2)  CXR today. 3)  Will call you w/ results this afternoon. 4)  Avoid smoking. 5)  Steroid shot today. 6)  Qvar = inhaled steroid -- 1 puff 2x a day (morning and night)...rinse mouth out after each use.  Use for the next 3 wks. 7)  ProAir = rescue inhaler -- use 2 puffs 4 x a day for the next 2 wks. 8)  Use RX cough syrup as needed.  It may make you sleepy. 9)  Take 5 days of Azithromycin = antibiotics. 10)  Call if not improved after 10 days. 11)  Return for PFTS in 1 month. Prescriptions: PROMETHAZINE VC/CODEINE 6.25-5-10 MG/5ML SYRP (PHENYLEPH-PROMETHAZINE-COD) 5 ml by mouth q 6 hrs as needed cough  #160 ml x 0   Entered and Authorized by:   Seymour Bars DO   Signed by:   Seymour Bars DO on 11/19/2009   Method used:   Printed then faxed to ...       [facility]* (retail)       [address] [place], [place]  [address]       Ph: [phone]       Fax: [phone]   RxID:   [id number] AZITHROMYCIN 250 MG TABS (AZITHROMYCIN) 2 tabs by mouth x 1 day then 1 tab by mouth daily x 4 days  #6 tabs x 0   Entered and Authorized by:   Seymour Bars DO   Signed by:   Seymour Bars DO on 11/19/2009   Method  used:   Electronically to        [facility]* (retail)       [address] [place], [place]  [address]       Ph: [phone]  Fax: [phone]   RxID:   [id number] PROAIR HFA 108 (90 BASE) MCG/ACT AERS (ALBUTEROL SULFATE) 2 puffs q 4 hrs as needed shortness of breath  #1 x 1   Entered and Authorized by:   Seymour Bars DO   Signed by:   Seymour Bars DO on 11/19/2009   Method used:   Electronically to        [facility]* (retail)       [address] [place], [place]  [address]       Ph: [phone]       Fax: [phone]   RxID:   [id number]    Medication Administration  Injection # 1:    Medication: Depo- Medrol 80mg     Diagnosis: COUGH (ICD-786.2)    Route: IM    Site: LUOQ gluteus    Exp Date: 05/2010    Lot #: [id number]    Patient tolerated injection without complications    Given by: Payton Spark CMA (November 19, 2009 10:43 AM)  Orders Added: 1)  T-DG Chest 2 View [71020] 2)  Depo- Medrol 80mg  [J1040] 3)  Admin of Therapeutic Inj  intramuscular or subcutaneous [96372] 4)  Est. Patient Level III [56433]     Medication Administration  Injection # 1:    Medication: Depo- Medrol 80mg     Diagnosis: COUGH (ICD-786.2)    Route: IM    Site: LUOQ gluteus    Exp Date: 05/2010    Lot #: [id number]    Patient tolerated injection without complications    Given by: Payton Spark CMA (November 19, 2009 10:43 AM)  Orders Added: 1)  T-DG Chest 2 View [71020] 2)  Depo- Medrol 80mg  [J1040] 3)  Admin of Therapeutic Inj  intramuscular or subcutaneous [96372] 4)  Est. Patient Level III [29518]

## 2010-02-02 NOTE — Assessment & Plan Note (Signed)
Summary: hearing loss   Vital Signs:  Patient profile:   50 year old female Height:      65 inches Weight:      185 pounds BMI:     30.90 O2 Sat:      97 % on Room air Temp:     98.6 degrees F oral Pulse rate:   85 / minute BP sitting:   135 / 91  (right arm) Cuff size:   regular  Vitals Entered By: Payton Spark CMA (February 18, 2009 1:23 PM)  O2 Flow:  Room air CC: Hearing loss  Hearing Screen 25db HL: Left  500 hz: No Response 1000 hz: 25db 2000 hz: 25db 4000 hz: No Response Right  500 hz: No Response 1000 hz: 25db 2000 hz: 25db 4000 hz: No Response  40db HL: Left  500 hz: No Response 4000 hz: No Response Right  500 hz: No Response 4000 hz: No Response    Primary Care Provider:  Seymour Bars D.O.  CC:  Hearing loss.  History of Present Illness: Jennifer [name] is a 50 year old woman who presents hearing loss. For the past 2 years she has noticed she has to turn the TV up louder to hear it, but for the last 3 months it has been worse. Every Thursday night she goes out with friends and has found herself unable to hear conversations. She thinks it might be her right ear more than her left. She feels that it is getting gradually worse. She has had sporadic ringing in her ears for years, but for the past few months has had it every day. She has had dizziness for the past 2 years related to fibromyalgia flares. She has had nasal congestion and swollen lymph nodes for the past 2 weeks which has given her some sensations of pressure in her R ear. She has had a cough and shortness of breath which is not unusual for her as she is a smoker. She also has episodes of chest pain which have been going on for years. She is wondering whether her TFTs, Vit B12, and Vit D are due to be checked.   Current Medications (verified): 1)  Clonazepam 1 Mg Tabs (Clonazepam) .... One By Mouth Two Times A Day 2)  Synthroid 150 Mcg  Tabs (Levothyroxine Sodium) .Marland Kitchen.. 1 Tab By Mouth Daily 3)   Trazodone Hcl 50 Mg Tabs (Trazodone Hcl) .... Take 1 Tablet By Mouth Once A Day At Bedtime 4)  Paroxetine Hcl 40 Mg Tabs (Paroxetine Hcl) .... Take 1/2 Tab Daily 5)  Folic Acid 6)  Estradiol 0.1 Mg/24hr Ptwk (Estradiol) .... Take 1 Tab By Mouth Daily 7)  Lamotrigine 25 Mg Tabs (Lamotrigine) .... Take 2 Tabs Every Morning and 4 Tabs Every Night 8)  Gabapentin 300 Mg Caps (Gabapentin) .... Take 4 Caps By Mouth Once Daily 9)  Santura .... Take As Directed Once Daily  Allergies (verified): 1)  ! Vicodin  Past History:  Past Medical History: Reviewed history from 06/25/2007 and no changes required. 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 suicide attempts x 2- Dr Christell Constant, psych perimenopausal fibromyalgia- Dr deveswar-- not seeing anymorer, chiropractor [facility] [phone] gyn : Dr Edward Jolly derm: Dr Isaac Laud [facility], Dr Naoma Diener in [place] [facility]:  Previous alcholic.   Social History: Reviewed history from 04/21/2006 and no changes required. 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  Married, happily to [place]. 4 grown kids.  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  Review of Systems      See HPI  Physical Exam  General:  Well-developed, well-nourished, overweight female in no acute distress. Head:  normocephalic and atraumatic.  normocephalic and atraumatic.   Eyes:  No corneal or conjunctival inflammation noted.  Ears:  External ear exam shows no significant lesions or deformities.  Otoscopic examination slightly opaque tympanic membranes with good light reflex bilaterally.  Nose:  no nasal discharge.  no nasal discharge.   Mouth:  Oral mucosa and oropharynx without lesions or exudates. Moist mucus membranes.  Neck:  No appreciable lymphadenopathy.  Lungs:  Normal respiratory effort, chest expands symmetrically. Lungs are clear to auscultation, no crackles or  wheezes. Heart:  Normal rate and regular rhythm. S1 and S2 normal without murmur, rub, or gallop. Extremities:  Warm and well-perfused.  Skin:  Normal color.  Cervical Nodes:  No lymphadenopathy noted   Impression & Recommendations:  Problem # 1:  HEARING LOSS - NOS (ICD-389.9)  Gradual hearing loss with abnormal tympanogram.  Refer to [facility] for further assessment.   Orders: Tympanometry (11914)  Problem # 2:  ELEVATED BLOOD PRESSURE WITHOUT DIAGNOSIS OF HYPERTENSION (ICD-796.2) Blood pressure remains elevated at 134/91.  Discussed low sodium diet and regular exercise. Will recheck blood pressure in 6 weeks.   Problem # 3:  HYPOTHYROIDISM NOS (ICD-244.9) Continue Synthroid, will recheck TFTs.  Her updated medication list for this problem includes:    Synthroid 150 Mcg Tabs (Levothyroxine sodium) .Marland Kitchen... 1 tab by mouth daily Orders: T-TSH ([id number])  Problem # 4:  VITAMIN B12 DEFICIENCY (ICD-266.2) Will recheck serum Vitamin B12 level.  Orders: T-Vitamin B12 ([id number])  Problem # 5:  UNSPECIFIED VITAMIN D DEFICIENCY (ICD-268.9) WIll recheck serum Vitamin D level.  Orders: T-Vitamin D (25-Hydroxy) [id number])  Complete Medication List: 1)  Clonazepam 1 Mg Tabs (Clonazepam) .... One by mouth two times a day 2)  Synthroid 150 Mcg Tabs (Levothyroxine sodium) .Marland Kitchen.. 1 tab by mouth daily 3)  Trazodone Hcl 50 Mg Tabs (Trazodone hcl) .... Take 1 tablet by mouth once a day at bedtime 4)  Paroxetine Hcl 40 Mg Tabs (Paroxetine hcl) .... Take 1/2 tab daily 5)  Folic Acid  6)  Estradiol 0.1 Mg/24hr Ptwk (Estradiol) .... Take 1 tab by mouth daily 7)  Lamotrigine 25 Mg Tabs (Lamotrigine) .... Take 2 tabs every morning and 4 tabs every night 8)  Gabapentin 300 Mg Caps (Gabapentin) .... Take 4 caps by mouth once daily 9)  Santura  .... Take as directed once daily  Patient Instructions: 1)  Return for a nurse BP check in 6 wks. 2)  Work on healthy low sodium diet and regular  exercise. 3)  Labs today. 4)  Will call you w/ results tomorrow. 5)  ENT/ audiology referral will be made.

## 2010-02-02 NOTE — Progress Notes (Signed)
Summary: Not feeling better  Phone Note Call from Patient Call back at Home Phone [phone]   Caller: Patient Call For: Seymour Bars DO Summary of Call: Pt calls and seen last wednesday for bronchitis and states that she is not feeling much better at all and hated to go through the long holiday weekend without getting anything. Please advise Initial call taken by: Kathlene November LPN,  November 25, 2009 9:23 AM  Follow-up for Phone Call        I reviewed her notes from 11-17- I treated her with Zithromax, Qvar, ProAir, cough syrup and Depo Medrol injection.  I advised if not better by 11-27 to call.  Current meds should still be helping.   Follow-up by: Seymour Bars DO,  November 25, 2009 10:06 AM  Additional Follow-up for Phone Call Additional follow up Details #1::        Pt notified of above instreuctins. KJ LPN Additional Follow-up by: Kathlene November LPN,  November 25, 2009 10:09 AM

## 2010-02-02 NOTE — Medication Information (Signed)
Summary: Approval for Veramyst/[employer]  Approval for Veram[employer]   Imported By: Lanelle Bal 07/02/2009 08:14:14  _____________________________________________________________________  External Attachment:    Type:   Image     Comment:   External Document

## 2010-02-02 NOTE — Therapy (Signed)
Summary: Tympanogram/[place] Kathryne Sharper  Tympanogram/[place] Kathryne Sharper   Imported By: Lanelle Bal 02/24/2009 14:12:29  _____________________________________________________________________  External Attachment:    Type:   Image     Comment:   External Document

## 2010-02-19 ENCOUNTER — Other Ambulatory Visit: Payer: Self-pay | Admitting: Obstetrics and Gynecology

## 2010-02-22 ENCOUNTER — Other Ambulatory Visit (HOSPITAL_COMMUNITY): Payer: Self-pay | Admitting: Obstetrics and Gynecology

## 2010-05-06 ENCOUNTER — Telehealth: Payer: Self-pay | Admitting: Family Medicine

## 2010-05-06 NOTE — Telephone Encounter (Signed)
Dr. Cathey Endow would you possibly remember where you originally ordered for patient to have sleep study with titration??  She went to [facility] recently and the doctor wrote order for her to have sleep study, but she prefers not to have to travel to [facility] due to distance, but wants to be referred to the original place you were going to send her originally.  Please advise. Jarvis Newcomer, LPN [facility]

## 2010-05-06 NOTE — Telephone Encounter (Signed)
Jennifer [name], can you look in centricity to see if there was an old sleep study referral for her?  If not whoever was going to order it at [facility] can order it elsewhere.

## 2010-05-07 ENCOUNTER — Telehealth: Payer: Self-pay | Admitting: Family Medicine

## 2010-05-07 NOTE — Telephone Encounter (Signed)
Pt wants to know where you would recommend for her to go for sleep study???  Wants [place] to help her find a sleep study clinic.  Please advise. Jarvis Newcomer, LPN [facility]

## 2010-05-09 NOTE — Telephone Encounter (Signed)
In [place], there is [facility] and a sleep lab at the new [facility].  She can also go to [place] or [place].

## 2010-05-11 ENCOUNTER — Telehealth: Payer: Self-pay | Admitting: Family Medicine

## 2010-05-11 NOTE — Telephone Encounter (Signed)
Pt wants information as to where she can go for sleep study with titration.  Needs the name/place where we originally referred her.  Called the patient and [facility] and told her I see where we originally sent for neuro referral on 40-20-2010 to Dr. Sheilah Pigeon.  Told her if this wasn't info she needed to call back. Jarvis Newcomer, LPN [facility]

## 2010-05-16 ENCOUNTER — Other Ambulatory Visit: Payer: Self-pay | Admitting: Family Medicine

## 2010-05-18 NOTE — Op Note (Signed)
NAMEELLIOTTE, MARSALIS               ACCOUNT NO.:  [id number]   MEDICAL RECORD NO.:  [id number]          PATIENT TYPE:  INP   LOCATION:  9307                          FACILITY:  [facility]   PHYSICIAN:  Excell Seltzer. Annabell Howells, M.D.    DATE OF BIRTH:  10/17/1960   DATE OF PROCEDURE:  09/26/2006  DATE OF DISCHARGE:                               OPERATIVE REPORT   PROCEDURE:  SPARC sling.   PREOPERATIVE DIAGNOSIS:  Stress incontinence.   POSTOPERATIVE DIAGNOSIS:  Stress incontinence.   SURGEON:  Excell Seltzer. Annabell Howells, M.D.   ASSISTANT:  Randye Lobo, M.D.   ANESTHESIA:  General.   SPECIMEN:  None.   DRAINS:  Foley catheter.   COMPLICATIONS:  None.   INDICATIONS:  Ms. Geng is a 50 year old white female with stress  incontinence.  She also has a cystocele, rectocele and abnormal uterine  bleeding.  She is to undergo hysterectomy, anterior and posterior  repair, and SPARC sling today.   FINDINGS OF PROCEDURE:  Dr. Edward Jolly completed the hysterectomy and opened  the anterior colporrhaphy. I then came into the operation.  A Foley  catheter was in position.  The anterior vaginal wall incision was  extended out to the mid urethral level. The mucosa was elevated off the  pubourethral fascia laterally sufficient to allow placement of a finger  adjacent to the Foley catheter on each side.  Small incisions were then  made approximately 2 cm lateral to the midline just above the pubic  bone, one on each side.  The [facility] trocars were passed. The right trocar  was passed by walking it behind the pubic bone out to the tip of the  finger in the vaginal incision.  This was then repeated on the left. The  Foley catheter was removed.  Cystoscopy was performed.  No bladder wall  trauma was noted.  The [facility] mesh was then snapped to the trocars and  drawn into position.  Repeat cystoscopy once again revealed no bladder  wall injury.  The sheath was removed from the mesh on each side. The  tension was adjusted until  appropriate with a gap between the urethra  with the Foley in place and the mesh being measured with a small  right angle.  At this point, the redundant ends of the sling were  trimmed, the abdominal area was cleansed, dried and the incisions were  closed with Dermabond.  At this point, Dr. Edward Jolly completed the AP  repair.  There were no complications during my portion of the procedure.      Excell Seltzer. Annabell Howells, M.D.  Electronically Signed     JJW/MEDQ  D:  09/26/2006  T:  09/26/2006  Job:  [id number]   cc:   Randye Lobo, M.D.  Fax: [phone]

## 2010-05-18 NOTE — H&P (Signed)
Jennifer [name], Jennifer [name]               ACCOUNT NO.:  [id number]   MEDICAL RECORD NO.:  [id number]          PATIENT TYPE:  INP   LOCATION:                                 FACILITY:   PHYSICIAN:  Randye Lobo, M.D.   DATE OF BIRTH:  09-24-1960   DATE OF ADMISSION:  09/26/2006  DATE OF DISCHARGE:                              HISTORY & PHYSICAL   CHIEF COMPLAINT:  Irregular menses, painful menses, cystocele, and  rectocele.   HISTORY OF PRESENT ILLNESS:  The patient is a 50 year old gravida,  actually para 34, Caucasian female, status post bilateral tubal ligation  who was referred by Dr. Bjorn Pippin for evaluation and treatment of  irregular menses, dysmenorrhea, and pelvic organ prolapse.  The patient  has been seeing Dr. Annabell Howells for evaluation and treatment of urinary  incontinence, and she is proceeding with a suburethral sling and  cystoscopy and would like to coordinate her gynecologic surgery at the  same surgical opportunity.  The patient reports that her irregular  menses had been bothering her this year.  She can skip a menstrual  period and then have two cycles within 1 month.  She does report  dysmenorrhea.  She has been experiencing some hot flashes and some night  sweats.  The patient has been previously seen by another gynecologist in  [place] who did perform a transvaginal ultrasound on May 31, 2006,  documenting a uterine endometrial stripe of 0.54 cm, a normal uterus,  and normal ovaries.  A saline infusion sonogram was performed and follow  up to this had documented a 1.7 mm polypoid mass within the endometrial  canal.   The patient chooses to proceed with complete pelvic reconstructive  surgery at the time of her sling and she requests hysterectomy with  removal of tubes and ovaries at the time of her prolapse repair.   GYN HISTORY:  The patient's past gynecologic history is remarkable for a  history of abnormal Pap smears.  The patient is status post cryotherapy  in 1985.  Her last Pap smear was performed in April 2000 was within  normal limits.  The patient has never had a mammogram and this has been  strongly recommended.  The patient is status post tubal ligation in 1986  and status post laparoscopic left ovarian cystectomy for benign disease.  The patient has had four vaginal deliveries and has had one miscarriage.   PAST MEDICAL HISTORY:  Her medical history is significant for:  1. Hypothyroidism.  2. Fibromyalgia.  3. Depression and anxiety.  4. Lupus of the skin.  5. The patient has had a cardiac work-up secondary to her sister's      death from a myocardial infarction at age 51.  The patient had a      negative work-up with no evidence of coronary artery disease.   PAST SURGICAL HISTORY:  Her surgical history is significant for only her  gynecologic procedures.   MEDICATIONS:  1. Sanctura XR 60 mg p.o. daily.  2. Levothyroxine 200 mcg p.o. daily.  3. Paxil 40 mg p.o. daily.  4. Clonazepam  1 mg p.o. daily.  5. Vitamin D.  6. Vitamin B injections.  7. Multivitamin.  8. Methotrexate.   ALLERGIES:  The patient has no known drug allergies.   SOCIAL HISTORY:  The patient is married.  She is a Futures trader.  She  smokes one-pack of cigarettes per day.  She denies the use of alcohol or  illicit drugs.   FAMILY HISTORY:  Her family history is significant for coronary artery  disease.  The patient's sister died of a myocardial infarction at age  44.  The patient's brother and an aunt committed suicide.  There is no  family history of breast, ovarian, uterine, or colon cancer.   REVIEW OF SYSTEMS:  The patient reports nocturia.  She has urinary  incontinence.  She denies constipation.   PHYSICAL EXAMINATION:  VITAL SIGNS:  Height is 5 feet 5 inches, weight  183 pounds, blood pressure 123/96.  GENERAL:  The patient is a middle-aged, Caucasian female in no acute  distress.  HEENT:  Normocephalic, atraumatic.  LUNGS:  Clear to  auscultation bilaterally.  HEART:  S1 and S2 with a regular rate and rhythm.  There is no evidence  of a murmur, rub or gallop.  ABDOMEN:  Soft and nontender and without evidence of hepatosplenomegaly  or organomegaly.  PELVIC:  The patient has normal external genitalia, normal urethra.  There is evidence of a second-degree cystocele, minimal uterine  prolapse, and a second-degree rectocele.  There is no evidence of any  cervical or vaginal lesions.  The uterus was noted to be small and  nontender.  No adnexal masses nor tenderness appreciated.  The anus is  without external lesions.   IMPRESSION:  The patient is a 50 year old, para 50 female, status post  bilateral tubal ligation, who has genuine stress incontinence,  incomplete uterovaginal prolapse, irregular menses with possible  intracavitary polyps, and pelvic pain.   PLAN:  The patient will undergo a laparoscopically-assisted vaginal  hysterectomy with bilateral salpingo-oophorectomy, anterior and  posterior colporrhaphy under my direction and a suburethral sling and  cystoscopy under the direction of Dr. Bjorn Pippin, all at the [facility] on September 26, 2006.  Risks, benefits, and  alternatives have been discussed with the patient who wishes to proceed.      Randye Lobo, M.D.  Electronically Signed     BES/MEDQ  D:  09/25/2006  T:  09/26/2006  Job:  [id number]

## 2010-05-18 NOTE — Op Note (Signed)
Jennifer [name]               ACCOUNT NO.:  [id number]   MEDICAL RECORD NO.:  [id number]          PATIENT TYPE:  AMB   LOCATION:  SDC                           FACILITY:  [facility]   PHYSICIAN:  Jennifer [name], M.D.   DATE OF BIRTH:  Aug 11, 1960   DATE OF PROCEDURE:  09/26/2006  DATE OF DISCHARGE:                               OPERATIVE REPORT   PREOPERATIVE DIAGNOSIS:  1. Incomplete uterovaginal prolapse.  2. Dysmenorrhea.  3. Dyspareunia.  4. Irregular menses.   POSTOPERATIVE DIAGNOSIS:  1. Incomplete uterovaginal prolapse.  2. Dysmenorrhea.  3. Dyspareunia.  4. Irregular menses.   PROCEDURE:  Laparoscopically assisted vaginal hysterectomy with  bilateral salpingo-oophorectomy, McCall culdoplasty, anterior and  posterior colporrhaphy.   SURGEON:  Jennifer Simmonds, MD.   ASSISTANT:  Jennifer Hong, MD.   ESTIMATED BLOOD LOSS:  200 mL   URINE OUTPUT:  400 mL.   ANESTHESIA:  General endotracheal, local.   COMPLICATIONS:  None.   INDICATIONS FOR PROCEDURE:  The patient is a 50 year old, para 66,  Caucasian female status post bilateral tubal ligation, who was referred  by Dr. Bjorn [name] for evaluation and treatment of incomplete  uterovaginal prolapse and a history of irregular and painful  menstruation.  The patient has seen Dr. Annabell [name] for urinary incontinence  and he is planning a suburethral sling and cystoscopy and like to  coordinate surgical effort.  The patient is reporting irregular and  sometimes skipped menstrual periods. She reports dysmenorrhea.  The  patient has previously had an ultrasound which was unremarkable, but  upon performing saline infusion sonography the patient was found to have  a small polypoid endometrial focus.   PHYSICAL EXAMINATION:  The patient is noted to have a second-degree  cystocele and a second-degree rectocele.  She has completed her  childbearing and she wishes to proceed with definitive removal of the  uterus, tubes and ovaries.  A plan  is now made to proceed with a  laparoscopically assisted vaginal hysterectomy with bilateral salpingo-  oophorectomy, vaginal support of the apex, and an anterior and posterior  colporrhaphy after risks, benefits, and alternatives have been reviewed.   FINDINGS:  Laparoscopy demonstrated a normal uterus, tubes and ovaries.  The fallopian tubes were consistent with bilateral tubal ligation and  there was a 2-cm cyst of the right ovary which contained clear yellow  fluid.  The fluid was noted at the time of the specimen removal  vaginally at which time the cyst ruptured.   The appendix, liver, gallbladder, and intestine all appeared to be  normal.  There was no evidence of any endometriosis or adhesive disease  in the abdomen or pelvis.   SPECIMENS:  The uterus, tubes and ovaries were sent to pathology.   DESCRIPTION OF PROCEDURE:  The patient was reidentified in the  preoperative hold area.  She did receive cefoxitin for an antibiotic  prophylaxis and both TED hose and PAS stockings for DVT prophylaxis.   In the operating room, general endotracheal anesthesia was induced and  the patient was then placed in the dorsal lithotomy position.  The  abdomen  and vagina were sterilely prepped and a Foley catheter was  placed inside the bladder after she was sterilely draped.   A single-tooth tenaculum was placed on the anterior cervical lip and  this was replaced with a Hulka tenaculum.   The laparoscopic portion of the procedure began by creating a 1-cm  umbilical incision with a scalpel.  A 10-mm trocar was introduced  directly into the peritoneal cavity without difficulty.  A laparoscope  confirmed proper placement and a CO2 pneumoperitoneum was then achieved.  The patient was placed in the Trendelenburg position and two 5 mm right  and left lower quadrant incisions were created with a scalpel and then 5  mm trocars placed under direct visualization of the laparoscope. An  inspection of  the pelvic and abdominal organs was performed and the  findings are as noted above.  The procedure began on the patient's left-  hand side after the left ureter was identified.  The left  infundibulopelvic ligament was grasped with the tripolar instrument and  cauterized and then sharply divided.  The dissection continued through  the left posterior leaf of the broad ligament using the same instrument.  The left round ligament was then similarly grasped, cauterized, and  sharply divided with the tripolar instrument.  The bladder flap was  taken down on the patient's left-hand side using the same instrument.  The uterine artery was cauterized on this side but was not divided.  The  same procedure that was performed on the patient's left-hand side was  then repeated on the right-hand side after the right ureter was  identified.  The right uterine artery was not cauterized on the  patient's right-hand side.  The bladder flap was not taken down in the  midline during the laparoscopic portion of the procedure.   The procedure then continued vaginally.  The patient was placed in the  high lithotomy position.  A weighted speculum was placed inside the  vagina and a tenaculum was placed on each of the anterior and the  posterior cervical lips.  The cervix was circumferentially injected with  0.5% lidocaine with 1:200,000 of epinephrine.  The cervix was  circumscribed with a scalpel. The posterior cul-de-sac was entered  sharply with a Mayo scissors.  A long weighted speculum was placed in  the posterior cul-de-sac.  Each of the uterosacral ligaments were then  clamped, sharply divided, and suture ligated with transfixing sutures of  #0 Vicryl.  The bladder was dissected off of the lower uterine segment.  The anterior cul-de-sac could not be entered initially.  The cardinal  ligaments were then sequentially clamped, sharply divided, and suture  ligated with #0 Vicryl.  Eventually the anterior  cul-de-sac could be  entered sharply and digital exam confirmed proper entry into this  location.  A Deaver was then placed in the anterior cul-de-sac.  The  remaining portions of the broad ligaments were then clamped, sharply  divided, and suture ligated with #0 Vicryl bilaterally.  The specimen  was removed at this time and sent to pathology.   All of the pedicles were examined and found to be hemostatic.   The posterior vaginal cuff was whip stitched with a running locked  suture of #0 Vicryl.  A McCall culdoplasty suture was performed with #0  Vicryl.  The suture was brought through the vagina and into the  posterior cul-de-sac at the 6 o'clock position, through the distal  uterosacral ligament on the patient's left-hand side, across the  posterior cul-de-sac in a pursestring fashion, down through the distal  right uterosacral ligament and then out through the cul-de-sac and into  the vagina at the 6 o'clock position.   The dissection for the anterior colporrhaphy was performed at this time.  Allis clamps were used to mark the anterior vaginal wall.  The vaginal  mucosa was injected with 0.5% lidocaine with 1:200,000 of epinephrine.  The vaginal mucosa was then divided in the midline.  The bladder and  subvaginal tissue was dissected off of the overlying vaginal mucosa.  Hemostasis was good.   At this time, the procedure was turned over to Dr. Bjorn [name] who  performed the [facility] sling and cystoscopy.  Please refer to this  dictation separately.   I then resumed completion of the anterior colporrhaphy and the rest of  the operation.  The anterior colporrhaphy was performed by placing  vertical mattress sutures of 2-0 Vicryl.  Excess vaginal mucosa was  trimmed.  There was a small amount of bleeding from the exit site of the  sling on the patient's right-hand side and a piece of Gelfoam was placed  in this region.  There was good hemostasis.  The anterior vaginal wall  was then  closed with a running locked suture of 2-0 Vicryl which  continued down to close the vaginal cuff.   The posterior colporrhaphy was performed next. Allis clamps were used to  mark the vaginal introitus and the posterior vaginal wall to the top of  the rectocele.  A triangular wedge of epithelium was removed from the  perineal body and the vagina was then incised vertically in the midline  using a Metzenbaum scissors.  The perirectal fascia was dissected off of  the vaginal mucosa bilaterally.  Hemostasis was created with monopolar  cautery.  The rectocele was reduced with a combination of vertical  mattress sutures of 2-0 Vicryl which transitioned down to #0 Vicryl  along the perineal body.  Excess vaginal mucosa was then trimmed and the  posterior vagina was closed with a running locked suture of 2-0 Vicryl  which continued along the perineal body in a subcuticular fashion for an  episiotomy.  A vaginal packing with Estrace cream was placed inside the  vagina.  The Foley catheter was left to gravity drainage.  Final rectal  examination confirmed the absence of sutures in the rectum.   The pneumoperitoneum was re-achieved and the pelvis was irrigated and  suctioned.  There were a couple of small bleeding edges of peritoneum on  the patient's left-hand side and a small pedicle on the patient's right-  hand side and one on the left-hand side.  These were superficially  coagulated to provide good hemostasis.   With good hemostasis at this time, the procedure was completed after the  pneumoperitoneum was partially released to check one final time for any  bleeding vessels. There were none.  The laparoscopic instruments were  removed.  The umbilical incision was closed with subcuticular sutures of  4-0 Vicryl. The suprapubic incisions were closed with Dermabond.   The patient was cleansed of any remaining Betadine, taken out of the  Trendelenburg position, and awakened and extubated.  She  was escorted to  the recovery room in stable and awake condition.  There were no  complications to the procedure.  All needle, instrument, and sponge  counts were correct.      Jennifer [name], M.D.  Electronically Signed     BES/MEDQ  D:  09/26/2006  T:  09/26/2006  Job:  [id number]   cc:   Excell Seltzer. Jennifer [name], M.D.  Fax: [phone]

## 2010-05-20 ENCOUNTER — Ambulatory Visit (HOSPITAL_COMMUNITY): Payer: Self-pay | Admitting: Licensed Clinical Social Worker

## 2010-05-21 NOTE — Discharge Summary (Signed)
Jennifer [name], Jennifer [name]               ACCOUNT NO.:  [id number]   MEDICAL RECORD NO.:  [id number]          PATIENT TYPE:  INP   LOCATION:  9307                          FACILITY:  [facility]   PHYSICIAN:  Randye Lobo, M.D.   DATE OF BIRTH:  1960-10-31   DATE OF ADMISSION:  09/26/2006  DATE OF DISCHARGE:  09/28/2006                               DISCHARGE SUMMARY   ADMISSION DIAGNOSIS:  1. Genuine stress incontinence.  2. Incomplete uterovaginal prolapse.  3. Irregular menses.  4. Dyspareunia and pelvic pain.   DISCHARGE DIAGNOSIS:  1. Genuine stress incontinence.  2. Status post laparoscopically-assisted vaginal hysterectomy with      bilateral salpingo-oophorectomy, anterior and posterior      colporrhaphy, McCall culdoplasty under the direction of Dr. Conley Simmonds, status post Sparc suburethral sling and cystoscopy under the      direction of Dr. Bjorn Pippin.  3. Postoperative urinary retention.   ADMISSION HISTORY AND PHYSICAL EXAMINATION:  The patient is a 50-year-  old para 67 Caucasian female, status post bilateral tubal ligation, who  was referred by Dr. Bjorn Pippin for evaluation and treatment of irregular  menses, uterovaginal prolapse, and pelvic pain.  The patient has been  seen and evaluated by Dr. Annabell Howells for treatment of stress urinary  incontinence.  The patient is scheduled to undergo a suburethral sling  and cystoscopy, and will be proceeding with definitive treatment for her  incomplete uterovaginal prolapse and removal of her ovaries at the same  surgical setting.  According to a previous transvaginal ultrasound, the  patient had an endometrial stripe measuring 0.54 cm, along with a normal-  appearing uterus and ovaries.  A saline sonogram documented a 1.7-mm  polypoid mass within the endometrial canal.   On physical examination, the patient has normal external genitalia and a  normal urethra.  There is a second-degree cystocele, minimal uterine  prolapse, and a  second-degree rectocele.  There was no evidence of any  cervical or vaginal lesions.  The uterus was small and nontender.  No  adnexal masses,  nor tenderness were appreciated.   HOSPITAL COURSE:  The patient was admitted on September 26, 2006, at  which time she underwent a laparoscopically-assisted vaginal  hysterectomy with bilateral salpingo-oophorectomy, anterior and  posterior colporrhaphy, and McCall culdoplasty under the direction of  Dr. Conley Simmonds with the assistance of Dr. Iris Pert.  The patient  also underwent a Sparc suburethral sling and cystoscopy under the  direction of Dr. Bjorn Pippin, and with the assistance of Dr. Conley Simmonds.  The patient's surgical findings were consistent with normal-appearing  ovaries, fallopian tubes and the uterus.  The cystoscopy demonstrated no  evidence of a foreign body, and demonstrated the presence of a normal-  appearing bladder.   The patient's surgery was uncomplicated.   Postoperatively, the patient's biggest issue was postoperative control  of her pain.  The patient did have a Dilaudid PCA initially.  On  postoperative day #1, this was converted over to oral Dilaudid.  The  patient began to develop nausea and vomiting on  postoperative day #1,  along with poor control of her pain.  She was converted back over to  Dilaudid IV and Phenergan IV.  The patient's symptoms continued,  however, and ultimately she was converted over to IV Demerol and Zofran  which controlled her symptoms more adequately.  By postoperative day #2,  the patient had good control of her pain with oral Percocet and oral  Zofran.   The patient was able to ambulate independently by postoperative day #1.  She did have both PAS stockings and TED hose for DVT prophylaxis.  The  patient's diet was advanced to normal, which she was tolerating at the  time of her discharge.   The patient had no evidence of any excessive vaginal bleeding during her  hospitalization.   Her abdominal laparoscopic incisions were consistent  with ecchymoses in the right lower quadrant incision.  There was no  drainage or erythema from the incisions.  The patient's suprapubic  incisions remained intact.  The patient's discharge hemoglobin was 11.3  and she was tolerating this well.   On postoperative day #1, the patient was experiencing urinary retention  with postvoid residuals up to 228 mL.  The patient's Foley catheter was  therefore replaced on the evening of postop day #1.  The patient  underwent a continued voiding trial on the day of her discharge with  instructions to have the Foley catheter replaced if she failed her  voiding trial prior to discharge.   The patient's final pathology report is pending at the time of her  discharge.  The patient was found to be in good condition and ready for  discharge on postoperative day #2.   DISCHARGE INSTRUCTIONS:  1. Discharged to home.  2. The patient will take the following medications:  Percocet 5 mg/325      mg 1-2 p.o. q.4-6 hours p.r.n. pain, Zofran 8 mg 1 p.o. q.8 hours      p.r.n. nausea, Climara estrogen patch 0.05 mg to skin weekly for      estrogen therapy, and ciprofloxacin 500 mg p.o. b.i.d. x10 days.  3..  The patient will follow a regular diet.  1. The patient will have decreased activity.  She will not work for 6      weeks.  She will not drive for 2 weeks.  She will not lift anything      heavier than 10 pounds or have sexual activity for the next 12      weeks.  2. The patient will use soap and water to care for her surgical      incisions.  3. The patient will follow up with Dr. Edward Jolly in the office in 4 weeks.      The patient will follow up with Dr. Annabell Howells in the office in 1 week      for a voiding evaluation.  4. The patient will call the office of either Dr. Annabell Howells or Dr. Edward Jolly      if she is experiencing problems with fever, nausea and vomiting,      pain uncontrolled by her medication, heavy  vaginal bleeding,      difficulty with her catheter was voiding, incisional drainage or      redness, pain uncontrolled by her medication, or any other concern.      Randye Lobo, M.D.  Electronically Signed     BES/MEDQ  D:  12/02/2006  T:  12/02/2006  Job:  [id number]

## 2010-05-28 ENCOUNTER — Ambulatory Visit (INDEPENDENT_AMBULATORY_CARE_PROVIDER_SITE_OTHER): Payer: BC Managed Care – PPO | Admitting: Licensed Clinical Social Worker

## 2010-05-28 DIAGNOSIS — F411 Generalized anxiety disorder: Secondary | ICD-10-CM

## 2010-06-02 ENCOUNTER — Other Ambulatory Visit: Payer: Self-pay | Admitting: Family Medicine

## 2010-06-02 ENCOUNTER — Ambulatory Visit (INDEPENDENT_AMBULATORY_CARE_PROVIDER_SITE_OTHER): Payer: BC Managed Care – PPO | Admitting: Family Medicine

## 2010-06-02 ENCOUNTER — Encounter: Payer: Self-pay | Admitting: Family Medicine

## 2010-06-02 VITALS — BP 135/89 | HR 85 | Ht 65.0 in | Wt 196.0 lb

## 2010-06-02 DIAGNOSIS — G589 Mononeuropathy, unspecified: Secondary | ICD-10-CM

## 2010-06-02 DIAGNOSIS — F17200 Nicotine dependence, unspecified, uncomplicated: Secondary | ICD-10-CM | POA: Insufficient documentation

## 2010-06-02 DIAGNOSIS — G629 Polyneuropathy, unspecified: Secondary | ICD-10-CM

## 2010-06-02 MED ORDER — NICOTINE 10 MG IN INHA: 1.0000 | RESPIRATORY_TRACT | Status: AC | PRN

## 2010-06-02 NOTE — Assessment & Plan Note (Signed)
Will try her on the nicotrol inhaler to help with smoking cessation.  Has tried and failed the patch and chantix and is motivated to quit now.

## 2010-06-02 NOTE — Telephone Encounter (Signed)
[facility] called and is requesting refill for the patient for her levothyroxine.  Refill was sent for this medication on 05-16-10 for #90/

## 2010-06-02 NOTE — Telephone Encounter (Signed)
Pt was set up with [facility] already. Jarvis Newcomer, LPN [facility] Dimes

## 2010-06-02 NOTE — Patient Instructions (Signed)
Labs downstairs today. Will call you w/ results tomorrow.  If labs are normal, plan to proceed with MRI C and L spine

## 2010-06-02 NOTE — Progress Notes (Signed)
  Subjective:    Patient ID: Jennifer [name], female    DOB: [birth year], 50 y.o.   MRN: [id number]  HPI  50 yo WF presents for cold sensation in both hands and feet on and off for years.  Over the past 3 wks, it has become worse.  She has a burning pain in both feet.  On Folic acid RX for concurrent use of MTX for SLE and is followed by [facility].  She has some neck and back pain with a hx of disc dz.  Denies recent falls or strains.  Denies change in skin color.  Not better or worse at rest.    BP 135/89  Pulse 85  Ht 5\' 5"  (1.651 m)  Wt 196 lb (88.905 kg)  BMI 32.62 kg/m2  SpO2 94%    Review of Systems  Constitutional: Negative for fever, chills, activity change and fatigue.  Respiratory: Negative for shortness of breath.   Cardiovascular: Negative for chest pain.  Musculoskeletal: Positive for myalgias and arthralgias. Negative for joint swelling and gait problem.  Neurological: Negative for weakness and headaches.       Objective:   Physical Exam  Constitutional: She appears well-developed and well-nourished. No distress.  Cardiovascular: Normal rate, regular rhythm and normal heart sounds.        2+ radial and pedal pulses  Pulmonary/Chest: Effort normal and breath sounds normal. No respiratory distress. She has no wheezes.  Musculoskeletal: She exhibits edema (1+ bilat pedal edema, no UE edema). She exhibits no tenderness.       Full c spine ROM  Neurological: She displays normal reflexes. Coordination normal.       Subjectively no appreciable loss of sensation to gross touch in either feet or hands, gait is normal  Skin: Skin is warm and dry.  Psychiatric: She has a normal mood and affect.          Assessment & Plan:

## 2010-06-02 NOTE — Assessment & Plan Note (Signed)
Symptoms c/w a neuropathy.  This could be peripheral or central given her hx.  Her pulses are great, so symptoms not likely to be vascular.  No sign of Raynauds on exam.  Will check B12, Folic Acid and TSH today.  If there is something to treat, will go ahead and treat and if not, will persue MRI of the C and L spine or a nerve conduction study.  Advised her that if she cannot feel the gas or break pedal, she should not drive.

## 2010-06-03 ENCOUNTER — Telehealth: Payer: Self-pay | Admitting: Family Medicine

## 2010-06-03 DIAGNOSIS — R200 Anesthesia of skin: Secondary | ICD-10-CM

## 2010-06-03 LAB — GLUCOSE, RANDOM: Glucose, Bld: 93 mg/dL (ref 70–99)

## 2010-06-03 LAB — FOLATE: Folate: >20 ng/mL

## 2010-06-03 NOTE — Telephone Encounter (Signed)
Pt called and I did discuss her labs because she was told that someone would call her this morning.  Pt still c/o lots of pain in lt foot/ankle  #8/10 at night.  If can get warm tolerable.  Please advise, and please review labs to make sure there isn't anything I should tell the pt. Please. Plan:  Routed to Dr. Linford Arnold since Dr. Cathey Endow out of office Jarvis Newcomer, LPN [facility] Dimes

## 2010-06-03 NOTE — Telephone Encounter (Signed)
Please let pt know that her sugar, B12, Folic acid and thyroid function look perfect.  Not causing her current symptoms.    Will need to start with just an XRay of her neck and L spine  Order put in, she can go downstairs for this.

## 2010-06-04 ENCOUNTER — Ambulatory Visit
Admission: RE | Admit: 2010-06-04 | Discharge: 2010-06-04 | Disposition: A | Discharge status: Home / Self Care | Payer: BC Managed Care – PPO | Source: Ambulatory Visit | Attending: Family Medicine | Admitting: Family Medicine

## 2010-06-04 ENCOUNTER — Encounter (INDEPENDENT_AMBULATORY_CARE_PROVIDER_SITE_OTHER): Payer: BC Managed Care – PPO | Admitting: Licensed Clinical Social Worker

## 2010-06-04 ENCOUNTER — Telehealth: Payer: Self-pay | Admitting: Family Medicine

## 2010-06-04 DIAGNOSIS — M792 Neuralgia and neuritis, unspecified: Secondary | ICD-10-CM

## 2010-06-04 DIAGNOSIS — F411 Generalized anxiety disorder: Secondary | ICD-10-CM

## 2010-06-04 MED ORDER — GABAPENTIN 300 MG PO CAPS: 300.0000 mg | ORAL_CAPSULE | ORAL | Status: DC

## 2010-06-04 MED ORDER — GABAPENTIN 300 MG PO CAPS: 300.0000 mg | ORAL_CAPSULE | Freq: Every day | ORAL | Status: DC

## 2010-06-04 NOTE — Telephone Encounter (Signed)
I talk with Darl Pikes about this referral and I do not see a old sleep study referral in the computer, but gave her the info of who we use here in [place]. Thanks

## 2010-06-04 NOTE — Telephone Encounter (Signed)
See next phone notes for continuation.

## 2010-06-04 NOTE — Telephone Encounter (Signed)
Pt informed that a referral was in process for Dr. Gaetano Net and her xray results were discussed with her.  Told pt to call our office back if she has not heard anything within the next one to two weeks. Jarvis Newcomer, LPN [facility] Dimes

## 2010-06-04 NOTE — Telephone Encounter (Signed)
Pt agrees to have Xrays

## 2010-06-04 NOTE — Telephone Encounter (Signed)
Agree with adding neurontin at night, 300 mg qhs.   I do still want her to do the C spine and L spine Xrays.

## 2010-06-04 NOTE — Telephone Encounter (Signed)
OK, can we get her med list entered so I can see what she is taking and then I will see what we can do.  Also can you call the pharm and cancel the neurontin I sent.

## 2010-06-04 NOTE — Telephone Encounter (Signed)
Dr. Linford Arnold, I have the med list entered.  I did not see the test that was ordered by Dr. Cathey Endow resulted yet. Darl Pikes

## 2010-06-04 NOTE — Telephone Encounter (Signed)
Pt was notified that we could start her on Neurontin at bedtime in the interim and Dr. Cathey Endow will return on Monday and decide if further imaging or nerve conductions studies need to be done.  Pt voiced understanding, and uses [facility]. Plan:  Routed to Dr. Linford Arnold and Dr. Arlice Colt, LPN [facility] Dimes

## 2010-06-04 NOTE — Telephone Encounter (Signed)
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  The next step would be to proceed with a nerve conduction study.  I will refer her to Dr Marcy Panning office for this here in [place].

## 2010-06-04 NOTE — Telephone Encounter (Signed)
Dr. Linford Arnold, Pt called back and said she is already on the generic neurontin (gabapentin). Jarvis Newcomer, LPN [facility] Dimes Routed to Dr. Linford Arnold.

## 2010-06-04 NOTE — Telephone Encounter (Signed)
We can start neurontin at bedtime for pain, and in the meantime Dr. B can consider if she wants to do further imaging or nerve conduction study.

## 2010-06-16 ENCOUNTER — Encounter (INDEPENDENT_AMBULATORY_CARE_PROVIDER_SITE_OTHER): Payer: BC Managed Care – PPO | Admitting: Licensed Clinical Social Worker

## 2010-06-16 DIAGNOSIS — F411 Generalized anxiety disorder: Secondary | ICD-10-CM

## 2010-06-23 ENCOUNTER — Encounter (INDEPENDENT_AMBULATORY_CARE_PROVIDER_SITE_OTHER): Payer: BC Managed Care – PPO | Admitting: Licensed Clinical Social Worker

## 2010-06-23 DIAGNOSIS — F411 Generalized anxiety disorder: Secondary | ICD-10-CM

## 2010-06-29 ENCOUNTER — Ambulatory Visit (INDEPENDENT_AMBULATORY_CARE_PROVIDER_SITE_OTHER): Payer: BC Managed Care – PPO | Admitting: Family Medicine

## 2010-06-29 ENCOUNTER — Encounter: Payer: Self-pay | Admitting: Family Medicine

## 2010-06-29 VITALS — BP 127/74 | HR 78 | Temp 98.2°F | Ht 64.5 in | Wt 200.0 lb

## 2010-06-29 DIAGNOSIS — M79604 Pain in right leg: Secondary | ICD-10-CM

## 2010-06-29 DIAGNOSIS — R609 Edema, unspecified: Secondary | ICD-10-CM

## 2010-06-29 DIAGNOSIS — M7989 Other specified soft tissue disorders: Secondary | ICD-10-CM

## 2010-06-29 DIAGNOSIS — M79609 Pain in unspecified limb: Secondary | ICD-10-CM

## 2010-06-29 DIAGNOSIS — R229 Localized swelling, mass and lump, unspecified: Secondary | ICD-10-CM

## 2010-06-29 DIAGNOSIS — R1013 Epigastric pain: Secondary | ICD-10-CM

## 2010-06-29 LAB — POCT URINALYSIS DIPSTICK
Bilirubin, UA: NEGATIVE
Ketones, UA: NEGATIVE
Leukocytes, UA: NEGATIVE
Nitrite, UA: NEGATIVE
Protein, UA: NEGATIVE
pH, UA: 7.5

## 2010-06-29 MED ORDER — ESOMEPRAZOLE MAGNESIUM 40 MG PO CPDR: 40.0000 mg | DELAYED_RELEASE_CAPSULE | Freq: Every day | ORAL | Status: DC

## 2010-06-29 NOTE — Patient Instructions (Signed)
Update labs today. Will call you w/ results tomorrow.  Trade Prilosec for Nexium once daily for acid reflux.  Elevate legs, use compression hose during the day for swelling and stick to a low sodium diet.  Return for f/u visit in 3 wks.

## 2010-06-29 NOTE — Progress Notes (Signed)
  Subjective:    Patient ID: Jennifer [name], female    DOB: 12-May-1960, 50 y.o.   MRN: [id number]  HPI  50 yo WF presents for L> R foot/ lower leg swelling for about a wk.  She was just seen by her rhematologist and had labs done and was seen for this.  I do not have her notes.  The swelling is no different depending on the time of day.  Denies any change in her urine.  She is drinking a lot of water.  She sees [facility].  Denies trauma and actually as had surgery in the R leg in the past.  She has been on one round of prednisone for her lupus in the past 6 mos per her report and is currently not taking anything.  She has gained 20 lbs this year.    BP 127/74  Pulse 78  Temp(Src) 98.2 F (36.8 C) (Oral)  Ht 5' 4.5" (1.638 m)  Wt 200 lb (90.719 kg)  BMI 33.80 kg/m2  SpO2 96%   Review of Systems  Respiratory: Negative for chest tightness and shortness of breath.   Cardiovascular: Positive for leg swelling. Negative for chest pain.  Musculoskeletal: Negative for myalgias, joint swelling and arthralgias.  Skin: Negative for color change.       Objective:   Physical Exam  Constitutional: She appears well-developed and well-nourished.       overwt  HENT:  Mouth/Throat: Oropharynx is clear and moist.  Eyes: Conjunctivae are normal. No scleral icterus.  Neck: Neck supple. No thyromegaly present.  Cardiovascular: Normal rate, regular rhythm and normal heart sounds.   No murmur heard. Pulmonary/Chest: Effort normal and breath sounds normal. No respiratory distress. She has no wheezes.  Musculoskeletal: She exhibits edema (1-2+ R pedal/ lower 1/3 of L leg with pitting edema, trace on the R). She exhibits no tenderness.       Neg Homan's sign  Lymphadenopathy:    She has no cervical adenopathy.  Skin: Skin is warm and dry. No rash noted. No erythema.          Assessment & Plan:

## 2010-06-30 ENCOUNTER — Telehealth: Payer: Self-pay | Admitting: Family Medicine

## 2010-06-30 DIAGNOSIS — M7989 Other specified soft tissue disorders: Secondary | ICD-10-CM | POA: Insufficient documentation

## 2010-06-30 DIAGNOSIS — R1013 Epigastric pain: Secondary | ICD-10-CM | POA: Insufficient documentation

## 2010-06-30 LAB — D-DIMER, QUANTITATIVE: D-Dimer, Quant: 0.3 ug/mL-FEU (ref 0.00–0.48)

## 2010-06-30 LAB — COMPLETE METABOLIC PANEL WITH GFR
ALT: 43 U/L — ABNORMAL HIGH (ref 0–35)
AST: 43 U/L — ABNORMAL HIGH (ref 0–37)
BUN: 9 mg/dL (ref 6–23)
Creat: 1 mg/dL (ref 0.50–1.10)
GFR, Est African American: >60 mL/min (ref >60)
Total Bilirubin: 0.4 mg/dL (ref 0.3–1.2)

## 2010-06-30 LAB — LIPASE: Lipase: 24 U/L (ref 0–75)

## 2010-06-30 LAB — AMYLASE: Amylase: 29 U/L (ref 0–105)

## 2010-06-30 MED ORDER — FUROSEMIDE 20 MG PO TABS: 20.0000 mg | ORAL_TABLET | Freq: Every day | ORAL | Status: DC

## 2010-06-30 NOTE — Assessment & Plan Note (Signed)
L> R leg swelling w/o known cause.  UA is neg for proteinuria.  Denies trauma or pain.  D dimer is NEG so this rules out a DVT.  Will treat her edema with Lasix 20 mg/ day x 7 days + leg elevation, low sodium diet and compression hose.  Consider venous u/s to look for varicosities and checking inguinal LNs.  If not improving.

## 2010-06-30 NOTE — Telephone Encounter (Signed)
Pls let pt know that her blood marker for blood clots is NEGATIVE.  They also do not reveal a cause for her abdominal pain (which could be gas or heartburn).  I will add Lasix 20 mg once daily x 7 days to help w/ leg swelling in additon to leg elevation and compression socks.  Fax copy of labs to [facility].

## 2010-06-30 NOTE — Telephone Encounter (Signed)
Pt was notified of her recent lab results.  Told her to pup a script from the pharm for the Lasix and take for 7 days and elevate her legs several times a day for 15-20 min intervals.  To use compression stockings. Jarvis Newcomer, LPN [facility]

## 2010-06-30 NOTE — Assessment & Plan Note (Signed)
On the way out the door, pt mentions vague epigastric pain that at times shoots to the back but no nausea, blood in the stool or wt loss.  I did check labs including pancreatic enzymes and they are nomral. She is returning in 3 wks for a full exam.

## 2010-07-05 ENCOUNTER — Other Ambulatory Visit: Payer: Self-pay | Admitting: *Deleted

## 2010-07-08 ENCOUNTER — Encounter (HOSPITAL_COMMUNITY): Payer: BC Managed Care – PPO | Admitting: Licensed Clinical Social Worker

## 2010-07-15 ENCOUNTER — Encounter (INDEPENDENT_AMBULATORY_CARE_PROVIDER_SITE_OTHER): Payer: BC Managed Care – PPO | Admitting: Licensed Clinical Social Worker

## 2010-07-15 DIAGNOSIS — F411 Generalized anxiety disorder: Secondary | ICD-10-CM

## 2010-07-23 ENCOUNTER — Encounter (INDEPENDENT_AMBULATORY_CARE_PROVIDER_SITE_OTHER): Payer: BC Managed Care – PPO | Admitting: Licensed Clinical Social Worker

## 2010-07-23 DIAGNOSIS — F3162 Bipolar disorder, current episode mixed, moderate: Secondary | ICD-10-CM

## 2010-07-27 ENCOUNTER — Encounter: Payer: Self-pay | Admitting: Family Medicine

## 2010-07-28 ENCOUNTER — Encounter: Payer: Self-pay | Admitting: Family Medicine

## 2010-07-28 ENCOUNTER — Ambulatory Visit (INDEPENDENT_AMBULATORY_CARE_PROVIDER_SITE_OTHER): Payer: BC Managed Care – PPO | Admitting: Family Medicine

## 2010-07-28 VITALS — BP 135/85 | HR 84 | Wt 195.0 lb

## 2010-07-28 DIAGNOSIS — M7989 Other specified soft tissue disorders: Secondary | ICD-10-CM

## 2010-07-28 MED ORDER — FUROSEMIDE 20 MG PO TABS: ORAL_TABLET | ORAL | Status: DC

## 2010-07-28 MED ORDER — ESOMEPRAZOLE MAGNESIUM 40 MG PO CPDR: 40.0000 mg | DELAYED_RELEASE_CAPSULE | Freq: Every day | ORAL | Status: DC

## 2010-07-28 NOTE — Assessment & Plan Note (Addendum)
Much improved.  Will move lasix to prn use only, continuing use of compression hose and leg elevation.  Avoid high sodium foods, work on regular exercise.  Venous stasis due to weight gain seems to be the cause.  Her labs were negative for other causes.

## 2010-07-28 NOTE — Progress Notes (Signed)
  Subjective:    Patient ID: Jennifer [name], female    DOB: 10-23-1960, 50 y.o.   MRN: [id number]  HPI  50 yo WF presents for f/u leg swelling.  I saw her on 6-26 and started her on Lasix 20 mg/ day for 7 days and she is down 4 lbs.  Her UA and labs all came back normal.  Elevating legs and using compression hose but not everyday.  Denies problems voiding.  She is no longer having epigastric pain.  She is on Nexium which has really helped.  She has not had hematochezia or melena.    BP 135/85  Pulse 84  Wt 195 lb (88.451 kg)  SpO2 96%    Review of Systems  Cardiovascular: Positive for leg swelling. Negative for chest pain.  Gastrointestinal: Negative for nausea, diarrhea and blood in stool.  Genitourinary: Negative for difficulty urinating.  Musculoskeletal: Positive for myalgias and arthralgias.       Objective:   Physical Exam  Constitutional: She appears well-developed and well-nourished.  HENT:  Mouth/Throat: Oropharynx is clear and moist.  Eyes: No scleral icterus.  Neck: Neck supple.  Cardiovascular: Normal rate, regular rhythm and normal heart sounds.   Pulmonary/Chest: Effort normal and breath sounds normal.  Abdominal: Soft. Bowel sounds are normal. She exhibits no distension. There is no tenderness. There is no guarding.  Musculoskeletal: She exhibits edema.  Skin: Skin is warm and dry.  Psychiatric: She has a normal mood and affect.          Assessment & Plan:

## 2010-07-28 NOTE — Patient Instructions (Signed)
Stay on Nexium.  Use Lasix only as needed for leg swelling. Work on improving exercise and weight loss. Use compression hose and leg elevation as needed.  See handout on achilles tendonitis.

## 2010-07-30 ENCOUNTER — Encounter (HOSPITAL_COMMUNITY): Payer: BC Managed Care – PPO | Admitting: Licensed Clinical Social Worker

## 2010-08-06 ENCOUNTER — Encounter (INDEPENDENT_AMBULATORY_CARE_PROVIDER_SITE_OTHER): Payer: BC Managed Care – PPO | Admitting: Licensed Clinical Social Worker

## 2010-08-06 DIAGNOSIS — F330 Major depressive disorder, recurrent, mild: Secondary | ICD-10-CM

## 2010-08-10 ENCOUNTER — Telehealth: Payer: Self-pay | Admitting: Family Medicine

## 2010-08-10 NOTE — Telephone Encounter (Signed)
Pt denies constipation.  Told the pt if she feels her symptoms are cardiology related, then she needs to call the cardiologist and run her symptoms by that office, and follow up there.   Jarvis Newcomer, LPN [facility]

## 2010-08-10 NOTE — Telephone Encounter (Signed)
Not sure what she is taking about.  Is she constipated?  If she thinks that her problem is with fluid retention or her heart, I suggest she call Dr Excell Seltzer, her cardiologist and f/u with him.

## 2010-08-10 NOTE — Telephone Encounter (Signed)
Pls let pt know that I reviewed the results of her sleep study.  She had only mild obstructive sleep apnea but study was notable for low oxygen levels (enough to warrant nighttime oxygen therapy) and poor sleep onset.  The next step would be to have [facility] come out to set her up for nighttime oxygen.  This will likely make her feel better.  Is she agreeable?

## 2010-08-10 NOTE — Telephone Encounter (Signed)
Pt notified of results and is agreeable to having [facility] come out.

## 2010-08-10 NOTE — Telephone Encounter (Signed)
Pt called and said she is having excessive swelling in her abdomen.  Wants to know if the provider wants to proceed with an echocardigram??  Please advise.  LMOM for the pt to call and verify if she was getting reg exercise and doing all the recommendations that the provider had instructed on the most recent office visit.  Pending pt call back. Routed to Dr. Arlice Colt, LPN [facility]

## 2010-08-13 ENCOUNTER — Encounter (HOSPITAL_COMMUNITY): Payer: BC Managed Care – PPO | Admitting: Licensed Clinical Social Worker

## 2010-08-20 ENCOUNTER — Encounter (HOSPITAL_COMMUNITY): Payer: BC Managed Care – PPO | Admitting: Licensed Clinical Social Worker

## 2010-08-27 ENCOUNTER — Telehealth: Payer: Self-pay | Admitting: *Deleted

## 2010-08-27 ENCOUNTER — Encounter (INDEPENDENT_AMBULATORY_CARE_PROVIDER_SITE_OTHER): Payer: BC Managed Care – PPO | Admitting: Licensed Clinical Social Worker

## 2010-08-27 DIAGNOSIS — F331 Major depressive disorder, recurrent, moderate: Secondary | ICD-10-CM

## 2010-08-27 NOTE — Telephone Encounter (Signed)
Pt had sleep study done that was ordered by Dr. Cathey Endow. Her insurance is now requiring a prior auth to cover the Cpap and O2. Call [employer] to get done and let pt know when it is done.

## 2010-09-07 ENCOUNTER — Other Ambulatory Visit: Payer: Self-pay | Admitting: *Deleted

## 2010-09-07 ENCOUNTER — Encounter (HOSPITAL_COMMUNITY): Payer: BC Managed Care – PPO | Admitting: Licensed Clinical Social Worker

## 2010-09-07 MED ORDER — FUROSEMIDE 20 MG PO TABS: 20.0000 mg | ORAL_TABLET | Freq: Every day | ORAL | Status: DC

## 2010-09-09 ENCOUNTER — Telehealth: Payer: Self-pay | Admitting: Family Medicine

## 2010-09-09 NOTE — Telephone Encounter (Signed)
Pt called a couple of weeks ago and requested a auth through [facility] to get CPAP machine.  I see in the phone notes that pt had called in and Kathlene November, LPN had sent a note on 08-27-10 to Payton Spark, CMA on that date, and it looks as though someone was going to call [employer] and get prior auth for pts CPAP machine, and now today pt calls and is upset because she has not heard anything about her CPAP.  Unfortunately while  speaking with the pt tried to contact Selena Batten or Payton Spark at 4:30pm but they were gone from office. Asked Jennifer/referrals if she knew about this encounter.  She didn't.  Informed pt that I would have to send high priority message to both staff members to see if they knew if this had been accomplished.  Told the pt if she had not heard from either staff member by 11:30 am tomorrow to call and speak with the front desk to get transferred to either of these staff members.  Pt voiced understanding. Jarvis Newcomer, LPN [facility]

## 2010-09-10 ENCOUNTER — Telehealth: Payer: Self-pay | Admitting: *Deleted

## 2010-09-10 NOTE — Telephone Encounter (Signed)
Pt needs a referral for a CPAP machine. Pt has had a sleep study and has been set up with home health for o2

## 2010-09-10 NOTE — Telephone Encounter (Signed)
She actually doesn't need a CPAP machine her sleep apnea was mild so they only recommended oxygen therapy. Do we still need a prescription for the oxygen therapy?

## 2010-09-13 NOTE — Telephone Encounter (Signed)
Pt aware.

## 2010-09-14 ENCOUNTER — Encounter (INDEPENDENT_AMBULATORY_CARE_PROVIDER_SITE_OTHER): Payer: BC Managed Care – PPO | Admitting: Licensed Clinical Social Worker

## 2010-09-14 DIAGNOSIS — F411 Generalized anxiety disorder: Secondary | ICD-10-CM

## 2010-09-24 ENCOUNTER — Encounter (INDEPENDENT_AMBULATORY_CARE_PROVIDER_SITE_OTHER): Payer: BC Managed Care – PPO | Admitting: Licensed Clinical Social Worker

## 2010-09-24 DIAGNOSIS — F411 Generalized anxiety disorder: Secondary | ICD-10-CM

## 2010-09-28 ENCOUNTER — Encounter: Payer: Self-pay | Admitting: Family Medicine

## 2010-09-28 ENCOUNTER — Ambulatory Visit (INDEPENDENT_AMBULATORY_CARE_PROVIDER_SITE_OTHER): Payer: BC Managed Care – PPO | Admitting: Family Medicine

## 2010-09-28 VITALS — BP 142/90 | HR 94 | Temp 98.4°F | Ht 64.5 in | Wt 192.0 lb

## 2010-09-28 DIAGNOSIS — Z23 Encounter for immunization: Secondary | ICD-10-CM

## 2010-09-28 DIAGNOSIS — R1011 Right upper quadrant pain: Secondary | ICD-10-CM

## 2010-09-28 DIAGNOSIS — J309 Allergic rhinitis, unspecified: Secondary | ICD-10-CM

## 2010-09-28 DIAGNOSIS — R110 Nausea: Secondary | ICD-10-CM

## 2010-09-28 MED ORDER — SUCRALFATE 1 G PO TABS: 2.0000 g | ORAL_TABLET | Freq: Two times a day (BID) | ORAL | Status: DC

## 2010-09-28 MED ORDER — ONDANSETRON 8 MG PO TBDP: 8.0000 mg | ORAL_TABLET | Freq: Three times a day (TID) | ORAL | Status: AC | PRN

## 2010-09-28 MED ORDER — FEXOFENADINE-PSEUDOEPHED ER 180-240 MG PO TB24: 1.0000 | ORAL_TABLET | Freq: Every day | ORAL | Status: DC

## 2010-09-28 MED ORDER — FLUTICASONE PROPIONATE 50 MCG/ACT NA SUSP: 2.0000 | Freq: Every day | NASAL | Status: DC

## 2010-09-28 NOTE — Patient Instructions (Addendum)
Please avoid any greasy or friedfood or animal products. Will need to eat a bland diet. [place] of GB but need to be fasting for 8-12 hours Return next week for follow up Use nasal spray as directed. Stop smoking.

## 2010-09-28 NOTE — Progress Notes (Signed)
  Subjective:    Patient ID: Jennifer [name], female    DOB: 03/23/1960, 50 y.o.   MRN: [id number]  HPI Patient w/ abdominal pain before. Was placed on a PPI several months ago. She states things got better initial but now she hsas had recurrent abdominal pain. Friday th e pain so bad she almost went to the ED> Yesterday it returned. She has had 4 children, had a colonoscopy about 2 ears , hx of colitis as well.   Review of Systems  Constitutional: Positive for appetite change and fatigue.       Decrease appetite w/ some nausea  HENT: Positive for congestion, rhinorrhea, neck pain and postnasal drip.   Cardiovascular: Negative for chest pain.  Gastrointestinal: Positive for nausea and abdominal pain.       Pain in R UQ and mid line that moves to her back and R shoulder       Objective:   Physical Exam  Constitutional: She is oriented to person, place, and time. She appears well-developed.  HENT:  Head: Normocephalic and atraumatic.       Tenderness over both maxillary sinuses and pain over R eustacian tube area  Neck: Normal range of motion. Neck supple.  Abdominal: Soft. Bowel sounds are normal. She exhibits no mass. There is tenderness. There is no rebound and no guarding.       Tenderness in mid epigastric and RUQ  Musculoskeletal: Normal range of motion.  Neurological: She is alert and oriented to person, place, and time.  Skin: Skin is warm and dry.  Psychiatric: She has a normal mood and affect. Her behavior is normal.          Assessment & Plan:  Abdominal pain RUQ will need to evaluate for GB attack. Warned if signs of stone will need GB removed. Sinus condition probably viral or allergy related will place on a decongestive,steroid nasal spray and antihistamine.  Please avoid any greasy or friedfood or animal products. Will need to eat a bland diet. [place] of [place] but need to be fasting for 8-12 hours Return next week for follow up Use nasal spray as directed.

## 2010-09-29 ENCOUNTER — Ambulatory Visit
Admission: RE | Admit: 2010-09-29 | Discharge: 2010-09-29 | Disposition: A | Discharge status: Home / Self Care | Payer: BC Managed Care – PPO | Source: Ambulatory Visit | Attending: Family Medicine | Admitting: Family Medicine

## 2010-09-29 ENCOUNTER — Other Ambulatory Visit: Payer: Self-pay | Admitting: Family Medicine

## 2010-09-29 DIAGNOSIS — R1011 Right upper quadrant pain: Secondary | ICD-10-CM

## 2010-09-30 ENCOUNTER — Encounter (INDEPENDENT_AMBULATORY_CARE_PROVIDER_SITE_OTHER): Payer: BC Managed Care – PPO | Admitting: Licensed Clinical Social Worker

## 2010-09-30 ENCOUNTER — Ambulatory Visit: Payer: BC Managed Care – PPO | Admitting: Family Medicine

## 2010-09-30 DIAGNOSIS — F411 Generalized anxiety disorder: Secondary | ICD-10-CM

## 2010-10-01 ENCOUNTER — Encounter (HOSPITAL_COMMUNITY): Payer: BC Managed Care – PPO | Admitting: Licensed Clinical Social Worker

## 2010-10-08 ENCOUNTER — Encounter (INDEPENDENT_AMBULATORY_CARE_PROVIDER_SITE_OTHER): Payer: BC Managed Care – PPO | Admitting: Licensed Clinical Social Worker

## 2010-10-08 DIAGNOSIS — F410 Panic disorder [episodic paroxysmal anxiety] without agoraphobia: Secondary | ICD-10-CM

## 2010-10-13 LAB — URINALYSIS, ROUTINE W REFLEX MICROSCOPIC
Bilirubin Urine: NEGATIVE
Glucose, UA: NEGATIVE
Hgb urine dipstick: NEGATIVE
Ketones, ur: NEGATIVE
Specific Gravity, Urine: 1.015
pH: 8

## 2010-10-13 LAB — COMPREHENSIVE METABOLIC PANEL
ALT: 53 — ABNORMAL HIGH
AST: 50 — ABNORMAL HIGH
Albumin: 3.7
CO2: 26
Calcium: 9.5
Creatinine, Ser: 0.81
GFR calc Af Amer: >60
GFR calc non Af Amer: >60
Sodium: 139

## 2010-10-13 LAB — CBC
Hemoglobin: 14.6
MCHC: 35.5
MCV: 96
RBC: 4.28
WBC: 6.2

## 2010-10-14 LAB — URINALYSIS, ROUTINE W REFLEX MICROSCOPIC
Glucose, UA: NEGATIVE
Hgb urine dipstick: NEGATIVE
Ketones, ur: NEGATIVE
Protein, ur: NEGATIVE
Urobilinogen, UA: 0.2

## 2010-10-14 LAB — PROTIME-INR
INR: 0.8
Prothrombin Time: 11.5 — ABNORMAL LOW

## 2010-10-14 LAB — COMPREHENSIVE METABOLIC PANEL
ALT: 50 — ABNORMAL HIGH
AST: 31
Albumin: 2.8 — ABNORMAL LOW
BUN: 7
BUN: <1 — ABNORMAL LOW
CO2: 26
Calcium: 9
Creatinine, Ser: 0.74
Creatinine, Ser: 0.78
GFR calc Af Amer: >60
GFR calc non Af Amer: >60
Glucose, Bld: 100 — ABNORMAL HIGH
Sodium: 137
Total Protein: 5 — ABNORMAL LOW
Total Protein: 6.7

## 2010-10-14 LAB — CBC
HCT: 31.8 — ABNORMAL LOW
HCT: 42.3
Hemoglobin: 15.3 — ABNORMAL HIGH
MCHC: 36.1 — ABNORMAL HIGH
MCV: 97.5
MCV: 99
Platelets: 178
RBC: 4.34
RDW: 13.1
RDW: 13.4

## 2010-10-19 ENCOUNTER — Encounter (HOSPITAL_COMMUNITY): Payer: BC Managed Care – PPO | Admitting: Licensed Clinical Social Worker

## 2010-10-21 ENCOUNTER — Telehealth: Payer: Self-pay | Admitting: Family Medicine

## 2010-10-21 NOTE — Telephone Encounter (Signed)
Pt called and left message for the triage nurse , and said she had experienced stomach problems and was having pain radiate to her back which is the same symptom she had before.  Her prior test that was done resulted out all negative.  Pt says pain returning and started two days ago.  Had med given carafate 2 gm twice daily, and pt states she had stopped the med because they are so hard to swallow. Plan:    Since the pills(carafate) is scored, told the pt to cut in half and put in applesauce or pudding and try to swallow a 1/2 of the pill at a time, but will have to do 4 of the halfs twice daily that a way.  Told pt the carafate will help her stomach problems and if Dr Thurmond Butts adds any add'l recommendations, then will call her with that information. Routed to Dr. Thurmond Butts. Jarvis Newcomer, LPN [facility]

## 2010-10-22 ENCOUNTER — Encounter (HOSPITAL_COMMUNITY): Payer: BC Managed Care – PPO | Admitting: Licensed Clinical Social Worker

## 2010-10-22 NOTE — Telephone Encounter (Signed)
Thank you. I agree 

## 2010-10-25 NOTE — Telephone Encounter (Signed)
Acknowledged. Jayliah Benett, LPN /Triage  

## 2010-11-10 ENCOUNTER — Other Ambulatory Visit (HOSPITAL_COMMUNITY): Payer: Self-pay | Admitting: Psychiatry

## 2010-11-10 DIAGNOSIS — F411 Generalized anxiety disorder: Secondary | ICD-10-CM

## 2010-11-10 MED ORDER — PAROXETINE HCL 20 MG PO TABS: 20.0000 mg | ORAL_TABLET | ORAL | Status: DC

## 2010-11-11 ENCOUNTER — Encounter (HOSPITAL_COMMUNITY): Payer: Self-pay | Admitting: Psychiatry

## 2010-11-12 ENCOUNTER — Other Ambulatory Visit (HOSPITAL_COMMUNITY): Payer: Self-pay | Admitting: Psychiatry

## 2010-11-12 ENCOUNTER — Telehealth (HOSPITAL_COMMUNITY): Payer: Self-pay

## 2010-11-12 DIAGNOSIS — F411 Generalized anxiety disorder: Secondary | ICD-10-CM

## 2010-11-12 MED ORDER — PAROXETINE HCL 40 MG PO TABS: ORAL_TABLET | ORAL | Status: DC

## 2010-11-12 MED ORDER — PAROXETINE HCL 20 MG PO TABS: ORAL_TABLET | ORAL | Status: DC

## 2010-11-12 NOTE — Telephone Encounter (Signed)
The order was written for 20 mg once a day, it needs to be for 2 a day.

## 2010-11-12 NOTE — Telephone Encounter (Signed)
Changed order to 40 mg tablet

## 2010-11-15 ENCOUNTER — Telehealth (HOSPITAL_COMMUNITY): Payer: Self-pay

## 2010-11-15 NOTE — Telephone Encounter (Signed)
PT called to ask if she stopped taking Paxil for a week now if that would cause her to have bad dreams and bad thoughts, things from her past.  Asked if she was having suicidal ideation she stated none at all. She is going to pick up her Paxil script later today and start back on it.

## 2010-11-16 NOTE — Telephone Encounter (Signed)
Returned call. Patient now back on Paxil. Side effects are gone.

## 2010-12-06 ENCOUNTER — Other Ambulatory Visit: Payer: Self-pay | Admitting: *Deleted

## 2010-12-06 MED ORDER — LEVOTHYROXINE SODIUM 150 MCG PO TABS: 150.0000 ug | ORAL_TABLET | Freq: Every day | ORAL | Status: DC

## 2010-12-21 ENCOUNTER — Ambulatory Visit: Payer: BC Managed Care – PPO | Admitting: Family Medicine

## 2011-01-04 DIAGNOSIS — K219 Gastro-esophageal reflux disease without esophagitis: Secondary | ICD-10-CM

## 2011-01-04 DIAGNOSIS — J45909 Unspecified asthma, uncomplicated: Secondary | ICD-10-CM

## 2011-01-04 HISTORY — DX: Gastro-esophageal reflux disease without esophagitis: K21.9

## 2011-01-04 HISTORY — DX: Unspecified asthma, uncomplicated: J45.909

## 2011-01-07 ENCOUNTER — Other Ambulatory Visit: Payer: Self-pay | Admitting: Family Medicine

## 2011-01-20 ENCOUNTER — Encounter: Payer: Self-pay | Admitting: Family Medicine

## 2011-01-20 ENCOUNTER — Ambulatory Visit
Admission: RE | Admit: 2011-01-20 | Discharge: 2011-01-20 | Disposition: A | Discharge status: Home / Self Care | Payer: BC Managed Care – PPO | Source: Ambulatory Visit | Attending: Family Medicine | Admitting: Family Medicine

## 2011-01-20 ENCOUNTER — Ambulatory Visit (INDEPENDENT_AMBULATORY_CARE_PROVIDER_SITE_OTHER): Payer: BC Managed Care – PPO | Admitting: Family Medicine

## 2011-01-20 VITALS — BP 124/78 | HR 81 | Temp 98.3°F | Ht 65.0 in | Wt 195.0 lb

## 2011-01-20 DIAGNOSIS — R9389 Abnormal findings on diagnostic imaging of other specified body structures: Secondary | ICD-10-CM

## 2011-01-20 DIAGNOSIS — R0602 Shortness of breath: Secondary | ICD-10-CM

## 2011-01-20 DIAGNOSIS — Z87891 Personal history of nicotine dependence: Secondary | ICD-10-CM

## 2011-01-20 DIAGNOSIS — J45909 Unspecified asthma, uncomplicated: Secondary | ICD-10-CM

## 2011-01-20 MED ORDER — ALBUTEROL SULFATE HFA 108 (90 BASE) MCG/ACT IN AERS: 2.0000 | INHALATION_SPRAY | RESPIRATORY_TRACT | Status: DC | PRN

## 2011-01-20 NOTE — Progress Notes (Signed)
Subjective:     Patient ID: Jennifer [name], female   DOB: 20-Aug-1960, 51 y.o.   MRN: [id number]  HPI Increase SOB since 3 years in the last 4 months started getting.worse. Patient reports that that the first week of January she was in the emergency room because of abdominal discomfort . His CT of the abdomen and was reported to her that maybe a spot or lower lobe of her left lungs. And recommendation for repeat evaluation in about a year. Patient concerned because of the increased breathing difficulty that she's been having.  She states that she's told before when she was having increased shortness of breath she needs to stop smoking.. She has tried reducing smoking before many times unsuccessfully which in turn has caused more stress. Told that she has to quit completely.    She does have a history of using O2 at night to sleep because of abnormal sleep study. She's currently using a Proair inhaler which she is essentially out of now.   Review of Systems  Constitutional: Positive for activity change.  Respiratory: Positive for cough, shortness of breath and wheezing.   Musculoskeletal: Positive for myalgias.      BP 124/78  Pulse 81  Temp(Src) 98.3 F (36.8 C) (Oral)  Ht 5\' 5"  (1.651 m)  Wt 195 lb (88.451 kg)  BMI 32.45 kg/m2  SpO2 96%  Objective:   Physical Exam  Constitutional: She is oriented to person, place, and time. She appears well-developed and well-nourished.  HENT:  Head: Normocephalic and atraumatic.  Right Ear: External ear normal.  Left Ear: External ear normal.  Neck: Normal range of motion. Neck supple. No tracheal deviation present.  Cardiovascular: Normal rate.   Pulmonary/Chest: Effort normal and breath sounds normal. No respiratory distress. She has no wheezes. She has no rales.  Neurological: She is alert and oriented to person, place, and time.  Skin: Skin is warm.  Psychiatric: She has a normal mood and affect.       Assessment: and Plan    #1  Pulmonary nodule will repat CXR leanimg towars CT scanif any qyestuion. #2 stress the importance of smoking cessation #3 To # this concern about COPD progression will get PFT studies done before she returns in 104month #4 tobacco abuse counciling was done

## 2011-01-20 NOTE — Patient Instructions (Signed)
Shortness of Breath Shortness of breath (dyspnea) is the feeling of uneasy breathing. Shortness of breath does not always mean that there is a life threatening illness. Dyspnea needs care right away. CAUSES  The list of causes for shortness of breath is very long and includes:  Not enough oxygen in the air (as with high altitudes or with a smoke-filled room).   Acute lung disease.   Infections such as pneumonia.   Fluid in the lungs, such as heart failure.   Blood clot in the lungs (pulmonary embolism).   Lasting (chronic) lung diseases.   Heart Disease (heart attack, angina, heart failure, and others).   Low red blood cells (anemia).   Poor physical fitness can cause shortness of breath with exercise.   Chest or back injuries or stiffness.   Being overweight (obese) can make it hard to breath.   Anxiety can make you feel like you are not getting enough air even though everything is all right.  DIAGNOSIS  Many tests may be done to find why you are having shortness of breath. Tests include:  Chest X-rays.   Lung function tests.   Blood tests.   Electrocardiogram.   Exercise testing.   A cardiac echo.   Scans.  Serious medical problems will usually be found during your exam. Your caregiver may not be able to find a cause for your shortness of breath after looking at you. In this case, it is important to have a follow-up and exam with your caregiver after a period of time.  HOME CARE INSTRUCTIONS   Do not smoke. Smoking is a common cause of shortness of breath. Ask for help to stop smoking.   Avoid being around chemicals that may bother your breathing (paint fumes, dust, etc.).   Rest as needed. Slowly begin your usual activities.   If medications were prescribed, take them as directed for the full length of time directed. This includes oxygen and any inhaled medications, if prescribed.   It is very important that you follow up with your caregiver or other  physician as directed. Waiting to do so or failure to follow-up could result in worsening of your condition and possible disability or death.   Be sure you understand what to do or who to call if your shortness of breath worsens.  SEEK MEDICAL CARE IF:   Your condition does not improve in the time expected.   You have a hard time doing your normal activities even with rest.   You have any side effects from or problems with medications prescribed.   You develop any new symptoms not discussed below.  SEEK IMMEDIATE MEDICAL CARE IF:   You feel your shortness of breath is getting worse.   You feel lightheaded, faint or develop a cough not controlled with medications.   You start coughing up blood.   You get pain with breathing.   You get chest pain or pain in your arms, shoulders or belly (abdomen).   You have a fever.   You are unable to walk up stairs or exercise the way you normally can.   If any of the symptoms, which brought you into the emergency room before, are getting worse and not better.  Document Released: 09/14/2000 Document Revised: 09/01/2010 Document Reviewed: 05/02/2007 [facility] Patient Information 2012 [place], [place].Smoking Cessation, Tips for Success YOU CAN QUIT SMOKING If you are ready to quit smoking, congratulations! You have chosen to help yourself be healthier. Cigarettes bring nicotine, tar, carbon monoxide, and  other irritants into your body. Your lungs, heart, and blood vessels will be able to work better without these poisons. There are many different ways to quit smoking. Nicotine gum, nicotine patches, a nicotine inhaler, or nicotine nasal spray can help with physical craving. Hypnosis, support groups, and medicines help break the habit of smoking. Here are some tips to help you quit for good.  Throw away all cigarettes.   Clean and remove all ashtrays from your home, work, and car.   On a card, write down your reasons for quitting. Carry the card  with you and read it when you get the urge to smoke.   Cleanse your body of nicotine. Drink enough water and fluids to keep your urine clear or pale yellow. Do this after quitting to flush the nicotine from your body.   Learn to predict your moods. Do not let a bad situation be your excuse to have a cigarette. Some situations in your life might tempt you into wanting a cigarette.   Never have "just one" cigarette. It leads to wanting another and another. Remind yourself of your decision to quit.   Change habits associated with smoking. If you smoked while driving or when feeling stressed, try other activities to replace smoking. Stand up when drinking your coffee. Brush your teeth after eating. Sit in a different chair when you read the paper. Avoid alcohol while trying to quit, and try to drink fewer caffeinated beverages. Alcohol and caffeine may urge you to smoke.   Avoid foods and drinks that can trigger a desire to smoke, such as sugary or spicy foods and alcohol.   Ask people who smoke not to smoke around you.   Have something planned to do right after eating or having a cup of coffee. Take a walk or exercise to perk you up. This will help to keep you from overeating.   Try a relaxation exercise to calm you down and decrease your stress. Remember, you may be tense and nervous for the first 2 weeks after you quit, but this will pass.   Find new activities to keep your hands busy. Play with a pen, coin, or rubber band. Doodle or draw things on paper.   Brush your teeth right after eating. This will help cut down on the craving for the taste of tobacco after meals. You can try mouthwash, too.   Use oral substitutes, such as lemon drops, carrots, a cinnamon stick, or chewing gum, in place of cigarettes. Keep them handy so they are available when you have the urge to smoke.   When you have the urge to smoke, try deep breathing.   Designate your home as a nonsmoking area.   If you are a  heavy smoker, ask your caregiver about a prescription for nicotine chewing gum. It can ease your withdrawal from nicotine.   Reward yourself. Set aside the cigarette money you save and buy yourself something nice.   Look for support from others. Join a support group or smoking cessation program. Ask someone at home or at work to help you with your plan to quit smoking.   Always ask yourself, "Do I need this cigarette or is this just a reflex?" Tell yourself, "Today, I choose not to smoke," or "I do not want to smoke." You are reminding yourself of your decision to quit, even if you do smoke a cigarette.  HOW WILL I FEEL WHEN I QUIT SMOKING?  The benefits of not smoking start within  days of quitting.   You may have symptoms of withdrawal because your body is used to nicotine (the addictive substance in cigarettes). You may crave cigarettes, be irritable, feel very hungry, cough often, get headaches, or have difficulty concentrating.   The withdrawal symptoms are only temporary. They are strongest when you first quit but will go away within 10 to 14 days.   When withdrawal symptoms occur, stay in control. Think about your reasons for quitting. Remind yourself that these are signs that your body is healing and getting used to being without cigarettes.   Remember that withdrawal symptoms are easier to treat than the major diseases that smoking can cause.   Even after the withdrawal is over, expect periodic urges to smoke. However, these cravings are generally short-lived and will go away whether you smoke or not. Do not smoke!   If you relapse and smoke again, do not lose hope. Most smokers quit 3 times before they are successful.   If you relapse, do not give up! Plan ahead and think about what you will do the next time you get the urge to smoke.  LIFE AS A NONSMOKER: MAKE IT FOR A MONTH, MAKE IT FOR LIFE Day 1: Hang this page where you will see it every day. Day 2: Get rid of all ashtrays,  matches, and lighters. Day 3: Drink water. Breathe deeply between sips. Day 4: Avoid places with smoke-filled air, such as bars, clubs, or the smoking section of restaurants. Day 5: Keep track of how much money you save by not smoking. Day 6: Avoid boredom. Keep a good book with you or go to the movies. Day 7: Reward yourself! One week without smoking! Day 8: Make a dental appointment to get your teeth cleaned. Day 9: Decide how you will turn down a cigarette before it is offered to you. Day 10: Review your reasons for quitting. Day 11: Distract yourself. Stay active to keep your mind off smoking and to relieve tension. Take a walk, exercise, read a book, do a crossword puzzle, or try a new hobby. Day 12: Exercise. Get off the bus before your stop or use stairs instead of escalators. Day 13: Call on friends for support and encouragement. Day 14: Reward yourself! Two weeks without smoking! Day 15: Practice deep breathing exercises. Day 16: Bet a friend that you can stay a nonsmoker. Day 17: Ask to sit in nonsmoking sections of restaurants. Day 18: Hang up "No Smoking" signs. Day 19: Think of yourself as a nonsmoker. Day 20: Each morning, tell yourself you will not smoke. Day 21: Reward yourself! Three weeks without smoking! Day 22: Think of smoking in negative ways. Remember how it stains your teeth, gives you bad breath, and leaves you short of breath. Day 23: Eat a nutritious breakfast. Day 24:Do not relive your days as a smoker. Day 25: Hold a pencil in your hand when talking on the telephone. Day 26: Tell all your friends you do not smoke. Day 27: Think about how much better food tastes. Day 28: Remember, one cigarette is one too many. Day 29: Take up a hobby that will keep your hands busy. Day 30: Congratulations! One month without smoking! Give yourself a big reward. Your caregiver can direct you to community resources or hospitals for support, which may include:  Group support.     Education.   Hypnosis.   Subliminal therapy.  Document Released: 09/18/2003 Document Revised: 09/01/2010 Document Reviewed: 10/06/2008 [facility] Patient Information 2012 [place]. 

## 2011-01-22 ENCOUNTER — Encounter: Payer: Self-pay | Admitting: Family Medicine

## 2011-01-26 ENCOUNTER — Ambulatory Visit: Payer: BC Managed Care – PPO

## 2011-01-27 ENCOUNTER — Other Ambulatory Visit (HOSPITAL_COMMUNITY): Payer: Self-pay | Admitting: Psychiatry

## 2011-01-27 DIAGNOSIS — F411 Generalized anxiety disorder: Secondary | ICD-10-CM

## 2011-01-27 MED ORDER — TRAZODONE HCL 50 MG PO TABS: 50.0000 mg | ORAL_TABLET | Freq: Every day | ORAL | Status: DC

## 2011-02-02 ENCOUNTER — Ambulatory Visit: Payer: BC Managed Care – PPO

## 2011-02-09 ENCOUNTER — Ambulatory Visit: Payer: BC Managed Care – PPO

## 2011-02-11 ENCOUNTER — Telehealth: Payer: Self-pay | Admitting: *Deleted

## 2011-02-11 NOTE — Telephone Encounter (Signed)
Pt called and states that she is short of breath and having chest pain and weak. I informed pt that she needs to be seen b/c she states she has been shob x2 days. Told pt I could put her in to be seen or she could go to [facility]. Pt states that she would not come in, that she would just deal with it.

## 2011-02-16 ENCOUNTER — Encounter: Payer: Self-pay | Admitting: *Deleted

## 2011-02-22 ENCOUNTER — Encounter: Payer: Self-pay | Admitting: Family Medicine

## 2011-02-22 ENCOUNTER — Ambulatory Visit (INDEPENDENT_AMBULATORY_CARE_PROVIDER_SITE_OTHER): Payer: BC Managed Care – PPO | Admitting: Family Medicine

## 2011-02-22 VITALS — BP 113/71 | HR 79 | Temp 98.2°F | Ht 64.0 in | Wt 197.0 lb

## 2011-02-22 DIAGNOSIS — J449 Chronic obstructive pulmonary disease, unspecified: Secondary | ICD-10-CM

## 2011-02-22 DIAGNOSIS — F17200 Nicotine dependence, unspecified, uncomplicated: Secondary | ICD-10-CM

## 2011-02-22 DIAGNOSIS — J441 Chronic obstructive pulmonary disease with (acute) exacerbation: Secondary | ICD-10-CM

## 2011-02-22 DIAGNOSIS — Z716 Tobacco abuse counseling: Secondary | ICD-10-CM

## 2011-02-22 DIAGNOSIS — Z23 Encounter for immunization: Secondary | ICD-10-CM

## 2011-02-22 MED ORDER — BUDESONIDE-FORMOTEROL FUMARATE 160-4.5 MCG/ACT IN AERO: 2.0000 | INHALATION_SPRAY | Freq: Two times a day (BID) | RESPIRATORY_TRACT | Status: DC

## 2011-02-22 MED ORDER — ALBUTEROL SULFATE HFA 108 (90 BASE) MCG/ACT IN AERS: 2.0000 | INHALATION_SPRAY | RESPIRATORY_TRACT | Status: DC | PRN

## 2011-02-22 MED ORDER — TIOTROPIUM BROMIDE MONOHYDRATE 18 MCG IN CAPS: 18.0000 ug | ORAL_CAPSULE | Freq: Every day | RESPIRATORY_TRACT | Status: DC

## 2011-02-22 NOTE — Patient Instructions (Signed)

## 2011-02-22 NOTE — Progress Notes (Signed)
  Subjective:    Patient ID: Jennifer [name], female    DOB: November 02, 1960, 51 y.o.   MRN: [id number]  HPI Patient is here because of followup of bronchitis and also smoking cessation. She had a horrible episode of bronchitis last week wound up at the ED on Tuesday was placed on medication sent home and Thursday she almost crashed she felt she mother and difficulty breathing or and felt miserable. This lasted the whole weekend long as she is feeling a little better today she was to have a pulmonary function study tests today but because of the active bronchitis that's been pushed into the future . Apparently there's been a referral to pulmonologist from the ED and I've encouraged her to keep that appointment since she had a lot of questions about COPD.\ Did report that she to use her home O2 that she uses for sleep apnea last weekend and I explained to her that is probably what allow her to stay out of the hospital for the weekend. She was not using her spriva because of cost and I stressed to her that she should be taking spriva every day.  #2 smoking cessation. She is not smoke for 8 days which is great. Unfortunately she was not able to tolerate Chantix because of nightmares. Wellbutrin caused her to be foggy in the mind and more depressed. The nitroglycerin patch adhesive caused reaction which he thinks is due to her lupus making her skin extra sensitive. She is chewing gum right now and will encourage her to keep chewing . She also has a support group that helps with her reduction of smoking.  #3 immunization update needed.  Review of Systems    as above  BP 113/71  Pulse 79  Temp(Src) 98.2 F (36.8 C) (Oral)  Ht 5\' 4"  (1.626 m)  Wt 197 lb (89.359 kg)  BMI 33.82 kg/m2  SpO2 93% Objective:   Physical Exam  Constitutional: She is oriented to person, place, and time. She appears well-developed and well-nourished.  HENT:  Head: Normocephalic and atraumatic.  Neck: Normal range of motion.  Neck supple.  Cardiovascular: Normal rate, regular rhythm, normal heart sounds and intact distal pulses.   Pulmonary/Chest: Effort normal and breath sounds normal. No respiratory distress.  Neurological: She is alert and oriented to person, place, and time.  Skin: Skin is warm and dry.  Psychiatric: Her speech is normal and behavior is normal. Thought content normal. Her mood appears anxious.          Assessment  & Plan   #1COPDwith acute exacerbation. We'll renew her spriva Ventolin HFA and Symbicort inhaler. Also explained to her that she should take 2 puffs of Symbicort twice a day and Tessalon should be used as a rescue only as a when necessary agent but the spriva should be used everyday. The pulmonologist or [place] doing spirometry will give [place] the best idea of how serious her COPD is. #2 smoking cessation encouraged her to continue to avoid cigarettes and avoid by restricting her husband smoking in her house car and around her. #3 immunization update she has had a flu vaccination but needs tetanus update with a DTaP.  Return in about 2 months for followup or modify PE.

## 2011-03-03 ENCOUNTER — Telehealth: Payer: Self-pay | Admitting: *Deleted

## 2011-03-03 DIAGNOSIS — J449 Chronic obstructive pulmonary disease, unspecified: Secondary | ICD-10-CM

## 2011-03-03 NOTE — Telephone Encounter (Signed)
Referral placed.

## 2011-03-03 NOTE — Telephone Encounter (Signed)
Sure go ahead and get her a pulmonologist  At [facility] if okay w/her.

## 2011-03-03 NOTE — Telephone Encounter (Signed)
Pt states that she made a mistake and that the ED have set her up to see a cardiologist and would like to know if you can refer her to a pulmonologist. States that she is not feeling any better and is still dizzy. Pt is being scheduled to see [facility] tomorrow.

## 2011-03-04 ENCOUNTER — Encounter: Payer: Self-pay | Admitting: Physician Assistant

## 2011-03-04 ENCOUNTER — Ambulatory Visit (INDEPENDENT_AMBULATORY_CARE_PROVIDER_SITE_OTHER): Payer: BC Managed Care – PPO | Admitting: Physician Assistant

## 2011-03-04 VITALS — BP 119/74 | HR 72 | Temp 98.1°F | Wt 197.0 lb

## 2011-03-04 DIAGNOSIS — J441 Chronic obstructive pulmonary disease with (acute) exacerbation: Secondary | ICD-10-CM

## 2011-03-04 DIAGNOSIS — R0602 Shortness of breath: Secondary | ICD-10-CM

## 2011-03-04 MED ORDER — METHYLPREDNISOLONE ACETATE 80 MG/ML IJ SUSP
80.0000 mg | Freq: Once | INTRAMUSCULAR | Status: AC
  Administered 2011-03-04: 80 mg via INTRAMUSCULAR

## 2011-03-04 MED ORDER — DOXYCYCLINE HYCLATE 100 MG PO CAPS: 100.0000 mg | ORAL_CAPSULE | Freq: Two times a day (BID) | ORAL | Status: AC

## 2011-03-04 MED ORDER — MOMETASONE FUROATE 50 MCG/ACT NA SUSP: 2.0000 | Freq: Every day | NASAL | Status: DC

## 2011-03-04 NOTE — Progress Notes (Signed)
  Subjective:    Patient ID: Jennifer [name], female    DOB: 12/09/1960, 51 y.o.   MRN: [id number]  HPI Patient has not felt well since the beginning of feburary. She went to the ER then and they gave her an antibiotic and steroid taper. She only took one day of the steroid because she was not able to sleep with medication and did not take the antibiotic as directed. She felt a little better for a couple of days but never recovered and has recently felt more short of breath. She has been using her spiriva and symbicort daily at maxium doses as Dr. Thurmond Butts prescribed. Before feburary patient was not using albuterol at all now she is using 4-5 times a day. She has not smoked in the last 3 weeks and been using nicorette gum.    Review of Systems     Objective:   Physical Exam  Constitutional: She is oriented to person, place, and time. She appears well-developed and well-nourished.  HENT:  Head: Normocephalic and atraumatic.  Right Ear: External ear normal.  Left Ear: External ear normal.  Mouth/Throat: Oropharynx is clear and moist.       TM's normal.  Eyes: Conjunctivae are normal.  Neck: Normal range of motion. Neck supple.  Cardiovascular: Normal rate, regular rhythm and normal heart sounds.   Pulmonary/Chest: Effort normal and breath sounds normal.       Bilateral coarse breath sounds. Patient was not able to take deep breath in without beginning to cough. Wheezing heard with expiration in the upper left lung. No wheezing heard in the right.  Lymphadenopathy:    She has no cervical adenopathy.  Neurological: She is alert and oriented to person, place, and time.  Psychiatric: She has a normal mood and affect. Her behavior is normal.          Assessment & Plan:  COPD with acute exacerbation- SPO2-94% Encouraged patient to keep up the good work and continue to avoid smoking. Gave depo medrol in office. Started Doxycyline Bid for 10 days. Continue other inhalers use albuterol inhaler as  needed. AFter resolution need to get spirometry for lung function and will get pnuemonia shot. Recheck on Tuesday 4 days.

## 2011-03-04 NOTE — Patient Instructions (Signed)
Will Start Doxycycline twice a day for 10 days. Given shot of steroid in office. Can continue with mucinex twice day and albuterol only as needed. Recheck on Tuesday.

## 2011-03-08 ENCOUNTER — Encounter: Payer: Self-pay | Admitting: Physician Assistant

## 2011-03-08 ENCOUNTER — Ambulatory Visit (INDEPENDENT_AMBULATORY_CARE_PROVIDER_SITE_OTHER): Payer: BC Managed Care – PPO | Admitting: Physician Assistant

## 2011-03-08 VITALS — BP 117/64 | HR 72 | Temp 98.2°F | Wt 198.0 lb

## 2011-03-08 DIAGNOSIS — Z1239 Encounter for other screening for malignant neoplasm of breast: Secondary | ICD-10-CM

## 2011-03-08 DIAGNOSIS — Z1231 Encounter for screening mammogram for malignant neoplasm of breast: Secondary | ICD-10-CM

## 2011-03-08 DIAGNOSIS — J449 Chronic obstructive pulmonary disease, unspecified: Secondary | ICD-10-CM

## 2011-03-08 NOTE — Progress Notes (Signed)
  Subjective:    Patient ID: Jennifer [name], female    DOB: 1960/08/15, 51 y.o.   MRN: [id number]  HPI Patient presents to the clinic to follow up from last week with a COPD exacerbation with bronchitis. Patient is doing much better today. She has not used her albuterol inhaler since Sunday and she only used it once Sunday. She has not been coughing nearly as much and is not SOB or wheezing even at night. She is still taking Doxycyline.    Review of Systems     Objective:   Physical Exam  Constitutional: She appears well-developed and well-nourished.  Cardiovascular: Normal rate, regular rhythm and normal heart sounds.   Pulmonary/Chest: Effort normal and breath sounds normal. She has no wheezes.          Assessment & Plan:  COPD-Continue on Doxycyline until medication gone. Consider Vit C and Zinc to strengthen immune system. Use albuterol only as needed and use other inhalers as directed. Will refer to pulmonology for evaluation and spirometry.   Refer for mammogram screening.

## 2011-03-08 NOTE — Patient Instructions (Addendum)
Continue on doxycyline until finished. Consider zinc and Vitamin C daily for immune support. Referred you to pulmonologist for spironmetry and for a mammogram.

## 2011-03-09 ENCOUNTER — Telehealth: Payer: Self-pay | Admitting: *Deleted

## 2011-03-09 NOTE — Telephone Encounter (Signed)
Jennifer [name] can do jury duty.

## 2011-03-09 NOTE — Telephone Encounter (Signed)
Pt states that she would like to know if you will write her a letter for her jury duty. She wants to know if we think she is well enough to do jury duty. Please advise.

## 2011-03-09 NOTE — Telephone Encounter (Signed)
LMOM

## 2011-03-10 ENCOUNTER — Ambulatory Visit (INDEPENDENT_AMBULATORY_CARE_PROVIDER_SITE_OTHER): Payer: BC Managed Care – PPO | Admitting: Licensed Clinical Social Worker

## 2011-03-10 DIAGNOSIS — F32A Depression, unspecified: Secondary | ICD-10-CM

## 2011-03-10 DIAGNOSIS — F332 Major depressive disorder, recurrent severe without psychotic features: Secondary | ICD-10-CM | POA: Insufficient documentation

## 2011-03-10 DIAGNOSIS — F329 Major depressive disorder, single episode, unspecified: Secondary | ICD-10-CM

## 2011-03-10 DIAGNOSIS — F3289 Other specified depressive episodes: Secondary | ICD-10-CM

## 2011-03-10 NOTE — Progress Notes (Signed)
   THERAPIST PROGRESS NOTE  Session Time: 1:05 - 2:00  Participation Level: Active  Behavioral Response: Well GroomedAlertAnxious and Euthymic  Type of Therapy: Individual Therapy  Treatment Goals addressed: Anxiety  Interventions: Motivational Interviewing, Supportive and Reframing  Summary: Jennifer [name] is a 51 y.o. female who presents with depression and anxiety.  [name] has a new diagnosis of COPD and she feels more depressed because of further medical problems.  She has stopped smoking.  She was scared because she got dizzy and needed O2 When she had a flare.  In February she couldn't walk and had trouble breathing.  She was angry that she had that habit of smoking and her husband smokes so he has to smoke out of the house.  She does not feel any urge to smoke.   Her son is now Museum/gallery exhibitions officer and he sends pictures of the baby.  His paternal grandfather is dying.  She hopes to keep things as positive as she can with him.   Suicidal/Homicidal: Nowithout intent/plan  Plan: Return again in 1 weeks.  Diagnosis: Axis I: Depressive Disorder NOS    Axis II: Deferred    [name],JUDITH A, LCSW 03/10/2011

## 2011-03-17 ENCOUNTER — Encounter: Payer: Self-pay | Admitting: Critical Care Medicine

## 2011-03-17 ENCOUNTER — Ambulatory Visit (INDEPENDENT_AMBULATORY_CARE_PROVIDER_SITE_OTHER): Payer: BC Managed Care – PPO | Admitting: Critical Care Medicine

## 2011-03-17 VITALS — BP 126/88 | HR 80 | Temp 98.0°F | Ht 64.0 in | Wt 194.0 lb

## 2011-03-17 DIAGNOSIS — E079 Disorder of thyroid, unspecified: Secondary | ICD-10-CM

## 2011-03-17 DIAGNOSIS — F329 Major depressive disorder, single episode, unspecified: Secondary | ICD-10-CM

## 2011-03-17 DIAGNOSIS — J4489 Other specified chronic obstructive pulmonary disease: Secondary | ICD-10-CM

## 2011-03-17 DIAGNOSIS — E039 Hypothyroidism, unspecified: Secondary | ICD-10-CM | POA: Insufficient documentation

## 2011-03-17 DIAGNOSIS — M797 Fibromyalgia: Secondary | ICD-10-CM

## 2011-03-17 DIAGNOSIS — R5382 Chronic fatigue, unspecified: Secondary | ICD-10-CM

## 2011-03-17 DIAGNOSIS — G9332 Myalgic encephalomyelitis/chronic fatigue syndrome: Secondary | ICD-10-CM

## 2011-03-17 DIAGNOSIS — F3289 Other specified depressive episodes: Secondary | ICD-10-CM

## 2011-03-17 DIAGNOSIS — J449 Chronic obstructive pulmonary disease, unspecified: Secondary | ICD-10-CM

## 2011-03-17 DIAGNOSIS — J411 Mucopurulent chronic bronchitis: Secondary | ICD-10-CM | POA: Insufficient documentation

## 2011-03-17 DIAGNOSIS — L930 Discoid lupus erythematosus: Secondary | ICD-10-CM

## 2011-03-17 DIAGNOSIS — IMO0001 Reserved for inherently not codable concepts without codable children: Secondary | ICD-10-CM

## 2011-03-17 DIAGNOSIS — F32A Depression, unspecified: Secondary | ICD-10-CM

## 2011-03-17 DIAGNOSIS — G4730 Sleep apnea, unspecified: Secondary | ICD-10-CM

## 2011-03-17 MED ORDER — BECLOMETHASONE DIPROPIONATE 40 MCG/ACT IN AERS: 2.0000 | INHALATION_SPRAY | Freq: Two times a day (BID) | RESPIRATORY_TRACT | Status: DC

## 2011-03-17 MED ORDER — ESOMEPRAZOLE MAGNESIUM 40 MG PO CPDR: 40.0000 mg | DELAYED_RELEASE_CAPSULE | Freq: Every day | ORAL | Status: DC

## 2011-03-17 NOTE — Patient Instructions (Signed)
Stop ALL OIL based vitamins: krill, primrose, black cohash Stop spiriva and symbicort Start Qvar two puff twice daily Resume nexium one daily Follow reflux diet Let us know who your sleep MD is and what your cpap settings are and the DME company you use You DO NOT HAVE COPD!! Return 2 months

## 2011-03-17 NOTE — Assessment & Plan Note (Addendum)
Normal spirometry No true COPD Recurrent bronchitis  Plan Stop ALL OIL based vitamins: krill, primrose, black cohash Stop spiriva and symbicort Start Qvar two puff twice daily Resume nexium one daily Follow reflux diet

## 2011-03-17 NOTE — Progress Notes (Signed)
Subjective:    Patient ID: Jennifer [name], female    DOB: June 11, 1960, 51 y.o.   MRN: [id number]  HPI Comments: Dx copd in Jan 2013.  No pfts yet done.   Symptoms: dyspnea and cough.  Notes tightness and pain in chest area Had a sleep study in the past:  Low oxygen levels and mild osa.  Rx cpap still snores and awakens gasping. Has nasal mask.   Pt has been in ED 2/12 with bronchitis spell Chronic cough for 9years  Shortness of Breath This is a chronic problem. The current episode started more than 1 year ago. The problem occurs daily (dyspnea is not constant, occ at rest and exertion). The problem has been gradually worsening. Associated symptoms include abdominal pain, chest pain, ear pain, headaches, neck pain, a rash, sputum production and wheezing. Pertinent negatives include no fever, hemoptysis, leg pain, leg swelling, orthopnea, PND, rhinorrhea, sore throat or vomiting. Associated symptoms comments: Chest tightness comes and goes. Her past medical history is significant for COPD. There is no history of allergies, aspirin allergies, asthma, bronchiolitis, CAD, chronic lung disease, DVT, a heart failure, PE, pneumonia or a recent surgery.  Cough This is a chronic problem. The current episode started more than 1 year ago. The problem has been gradually improving. The problem occurs every few hours. The cough is non-productive. Associated symptoms include chest pain, chills, ear pain, headaches, heartburn, myalgias, a rash, shortness of breath and wheezing. Pertinent negatives include no fever, hemoptysis, postnasal drip, rhinorrhea or sore throat. The symptoms are aggravated by lying down. She has tried a beta-agonist inhaler, steroid inhaler and ipratropium inhaler (symbicort and spiriva since jan 2013) for the symptoms. The treatment provided mild relief. Her past medical history is significant for bronchitis and COPD. There is no history of asthma, bronchiectasis, emphysema, environmental  allergies or pneumonia.     Past Medical History  Diagnosis Date  . Discoid lupus   . Thyroid disease   . Depression     suicide attempts X 2  . Perimenopausal   . Fibromyalgia   . CFS (chronic fatigue syndrome)   . Alcohol abuse   . Sleep apnea      Family History  Problem Relation Age of Onset  . Hypertension Father   . Heart disease Sister 45    heart attack  . Depression Brother   . Cancer Mother 37    colon     History   Social History  . Marital Status: Married    Spouse Name: N/A    Number of Children: 4  . Years of Education: N/A   Occupational History  .     Social History Main Topics  . Smoking status: Former Smoker -- 1.0 packs/day for 36 years    Types: Cigarettes    Quit date: 02/11/2011  . Smokeless tobacco: Never Used  . Alcohol Use: No     quit 01-2006/prior alcoholic-in AA  . Drug Use: No  . Sexually Active: Not on file   Other Topics Concern  . Not on file   Social History Narrative  . No narrative on file     Allergies  Allergen Reactions  . Acetaminophen     Pt states she is unable to take this bc of Hosimonto Thyroid  . Hydrocodone-Acetaminophen     REACTION: agitation  . Oxycodone     Hallucinating, sweating, itching     Outpatient Prescriptions Prior to Visit  Medication Sig Dispense Refill  . albuterol (  VENTOLIN HFA) 108 (90 BASE) MCG/ACT inhaler Inhale 2 puffs into the lungs every 4 (four) hours as needed for wheezing.  1 Inhaler  11  . chloroquine (ARALEN) 250 MG tablet Take 125 mg by mouth daily.       . clonazePAM (KLONOPIN) 1 MG tablet Take 1 mg by mouth 2 (two) times daily as needed.        . cyclobenzaprine (FLEXERIL) 10 MG tablet Take 10 mg by mouth as needed.       . folic acid (FOLVITE) 1 MG tablet Take 1 mg by mouth daily.        Marland Kitchen lamoTRIgine (LAMICTAL) 25 MG tablet Take by mouth daily. Take  (2) tabs in the am and (4) tabs every night.      . levothyroxine (SYNTHROID, LEVOTHROID) 150 MCG tablet TAKE 1 TABLET  BY MOUTH EVERY DAY  30 tablet  2  . methotrexate 2.5 MG tablet Take by mouth once a week. Take 6 tabs weekly of the 2.5 mg.       . oxybutynin (DITROPAN-XL) 5 MG 24 hr tablet Take 5 mg by mouth daily.       Marland Kitchen PARoxetine (PAXIL) 40 MG tablet Take 1 tablet po daily  30 tablet  5  . budesonide-formoterol (SYMBICORT) 160-4.5 MCG/ACT inhaler Inhale 2 puffs into the lungs 2 (two) times daily.  1 Inhaler  12  . furosemide (LASIX) 20 MG tablet 1 tab po daily prn leg swelling  30 tablet  1  . gabapentin (NEURONTIN) 300 MG capsule Take 1 capsule (300 mg total) by mouth daily after supper.  30 capsule  2  . tiotropium (SPIRIVA HANDIHALER) 18 MCG inhalation capsule Place 1 capsule (18 mcg total) into inhaler and inhale daily.  30 capsule  12  . traZODone (DESYREL) 50 MG tablet Take 1 tablet (50 mg total) by mouth at bedtime. 3-4 QHS  120 tablet  5  . beclomethasone (QVAR) 40 MCG/ACT inhaler Inhale 1 puff into the lungs 2 (two) times daily.        Marland Kitchen esomeprazole (NEXIUM) 40 MG capsule Take 1 capsule (40 mg total) by mouth daily.  30 capsule  3  . fexofenadine-pseudoephedrine (ALLEGRA-D 24 HOUR) 180-240 MG per 24 hr tablet Take 1 tablet by mouth daily.  30 tablet  6  . furosemide (LASIX) 20 MG tablet Take 1 tablet (20 mg total) by mouth daily.  30 tablet  0  . hydroxychloroquine (PLAQUENIL) 200 MG tablet       . mometasone (NASONEX) 50 MCG/ACT nasal spray Place 2 sprays into the nose daily.  17 g  12  . omeprazole (PRILOSEC) 20 MG capsule       . predniSONE (DELTASONE) 5 MG tablet       . sucralfate (CARAFATE) 1 G tablet Take 2 tablets (2 g total) by mouth 2 (two) times daily before a meal. preferbly an hour before eating  120 tablet  6      Review of Systems  Constitutional: Positive for chills, diaphoresis, activity change, appetite change and fatigue. Negative for fever and unexpected weight change.  HENT: Positive for hearing loss, ear pain, congestion, facial swelling, mouth sores, trouble swallowing,  neck pain, neck stiffness, voice change, sinus pressure and tinnitus. Negative for nosebleeds, sore throat, rhinorrhea, sneezing, dental problem, postnasal drip and ear discharge.   Eyes: Positive for visual disturbance. Negative for photophobia, discharge and itching.  Respiratory: Positive for cough, sputum production, choking, chest tightness, shortness of breath and  wheezing. Negative for apnea, hemoptysis and stridor.   Cardiovascular: Positive for chest pain. Negative for palpitations, orthopnea, leg swelling and PND.  Gastrointestinal: Positive for heartburn, nausea, abdominal pain and abdominal distention. Negative for vomiting, constipation and blood in stool.  Genitourinary: Positive for frequency. Negative for dysuria, urgency, hematuria, flank pain, decreased urine volume and difficulty urinating.  Musculoskeletal: Positive for myalgias, back pain, joint swelling, arthralgias and gait problem.  Skin: Positive for color change, pallor and rash.  Neurological: Positive for dizziness, speech difficulty, weakness, light-headedness and headaches. Negative for tremors, seizures, syncope and numbness.  Hematological: Negative for environmental allergies and adenopathy. Bruises/bleeds easily.  Psychiatric/Behavioral: Positive for confusion and sleep disturbance. Negative for agitation. The patient is nervous/anxious.        Objective:   Physical Exam  Filed Vitals:   03/17/11 1044  BP: 126/88  Pulse: 80  Temp: 98 F (36.7 C)  TempSrc: Oral  Height: 5\' 4"  (1.626 m)  Weight: 194 lb (87.998 kg)  SpO2: 96%    Gen: Pleasant, well-nourished, in no distress,  normal affect  ENT: No lesions,  mouth clear,  oropharynx clear, no postnasal drip  Neck: No JVD, no TMG, no carotid bruits  Lungs: No use of accessory muscles, no dullness to percussion, clear without rales or rhonchi  Cardiovascular: RRR, heart sounds normal, no murmur or gallops, no peripheral edema  Abdomen: soft and NT,  no HSM,  BS normal  Musculoskeletal: No deformities, no cyanosis or clubbing  Neuro: alert, non focal  Skin: Warm, no lesions or rashes  Cleda Daub 03/17/2011 Normal   CXR 01/26/11: NAD     Assessment & Plan:   Obstructive chronic bronchitis without exacerbation Normal spirometry No true COPD Recurrent bronchitis  Plan Stop ALL OIL based vitamins: krill, primrose, black cohash Stop spiriva and symbicort Start Qvar two puff twice daily Resume nexium one daily Follow reflux diet     Updated Medication List Outpatient Encounter Prescriptions as of 03/17/2011  Medication Sig Dispense Refill  . albuterol (VENTOLIN HFA) 108 (90 BASE) MCG/ACT inhaler Inhale 2 puffs into the lungs every 4 (four) hours as needed for wheezing.  1 Inhaler  11  . chloroquine (ARALEN) 250 MG tablet Take 125 mg by mouth daily.       . clonazePAM (KLONOPIN) 1 MG tablet Take 1 mg by mouth 2 (two) times daily as needed.        . Cyanocobalamin (VITAMELTS ENERGY VITAMIN B-12) 1500 MCG TBDP Take 1 tablet by mouth daily.      . cyclobenzaprine (FLEXERIL) 10 MG tablet Take 10 mg by mouth as needed.       . folic acid (FOLVITE) 1 MG tablet Take 1 mg by mouth daily.        Marland Kitchen gabapentin (NEURONTIN) 300 MG capsule Take 600 mg by mouth 2 (two) times daily.      . Ginkgo Biloba 60 MG CAPS Take 1 capsule by mouth daily.      Marland Kitchen lamoTRIgine (LAMICTAL) 25 MG tablet Take by mouth daily. Take  (2) tabs in the am and (4) tabs every night.      . levothyroxine (SYNTHROID, LEVOTHROID) 150 MCG tablet TAKE 1 TABLET BY MOUTH EVERY DAY  30 tablet  2  . methotrexate 2.5 MG tablet Take by mouth once a week. Take 6 tabs weekly of the 2.5 mg.       . oxybutynin (DITROPAN-XL) 5 MG 24 hr tablet Take 5 mg by mouth daily.       Marland Kitchen  PARoxetine (PAXIL) 40 MG tablet Take 1 tablet po daily  30 tablet  5  . Spacer/Aero-Holding Chambers (AEROCHAMBER MV) inhaler by Other route. Use as instructed with symbicort and albuterol hfa      . traMADol  (ULTRAM) 50 MG tablet Take 50 mg by mouth 4 (four) times daily as needed.      . traZODone (DESYREL) 50 MG tablet 3-4 tablets by mouth at bedtime as needed      . DISCONTD: Black Cohosh 540 MG CAPS Take 1 capsule by mouth at bedtime.      Marland Kitchen DISCONTD: budesonide-formoterol (SYMBICORT) 160-4.5 MCG/ACT inhaler Inhale 2 puffs into the lungs 2 (two) times daily.  1 Inhaler  12  . DISCONTD: Evening Primrose Oil 1000 MG CAPS Take 1 capsule by mouth every evening.      Marland Kitchen DISCONTD: furosemide (LASIX) 20 MG tablet 1 tab po daily prn leg swelling  30 tablet  1  . DISCONTD: gabapentin (NEURONTIN) 300 MG capsule Take 1 capsule (300 mg total) by mouth daily after supper.  30 capsule  2  . DISCONTD: KRILL OIL OMEGA-3 PO Take 1 capsule by mouth daily.      Marland Kitchen DISCONTD: tiotropium (SPIRIVA HANDIHALER) 18 MCG inhalation capsule Place 1 capsule (18 mcg total) into inhaler and inhale daily.  30 capsule  12  . DISCONTD: traZODone (DESYREL) 50 MG tablet Take 1 tablet (50 mg total) by mouth at bedtime. 3-4 QHS  120 tablet  5  . beclomethasone (QVAR) 40 MCG/ACT inhaler Inhale 2 puffs into the lungs 2 (two) times daily.  1 Inhaler  2  . esomeprazole (NEXIUM) 40 MG capsule Take 1 capsule (40 mg total) by mouth daily.  30 capsule  6  . furosemide (LASIX) 20 MG tablet Take 20 mg by mouth daily as needed.      Marland Kitchen DISCONTD: beclomethasone (QVAR) 40 MCG/ACT inhaler Inhale 1 puff into the lungs 2 (two) times daily.        Marland Kitchen DISCONTD: esomeprazole (NEXIUM) 40 MG capsule Take 1 capsule (40 mg total) by mouth daily.  30 capsule  3  . DISCONTD: fexofenadine-pseudoephedrine (ALLEGRA-D 24 HOUR) 180-240 MG per 24 hr tablet Take 1 tablet by mouth daily.  30 tablet  6  . DISCONTD: furosemide (LASIX) 20 MG tablet Take 1 tablet (20 mg total) by mouth daily.  30 tablet  0  . DISCONTD: hydroxychloroquine (PLAQUENIL) 200 MG tablet       . DISCONTD: mometasone (NASONEX) 50 MCG/ACT nasal spray Place 2 sprays into the nose daily.  17 g  12  .  DISCONTD: omeprazole (PRILOSEC) 20 MG capsule       . DISCONTD: predniSONE (DELTASONE) 5 MG tablet       . DISCONTD: sucralfate (CARAFATE) 1 G tablet Take 2 tablets (2 g total) by mouth 2 (two) times daily before a meal. preferbly an hour before eating  120 tablet  6

## 2011-03-22 ENCOUNTER — Ambulatory Visit (HOSPITAL_COMMUNITY): Payer: Self-pay | Admitting: Licensed Clinical Social Worker

## 2011-03-29 ENCOUNTER — Ambulatory Visit
Admission: RE | Admit: 2011-03-29 | Discharge: 2011-03-29 | Disposition: A | Discharge status: Home / Self Care | Payer: BC Managed Care – PPO | Source: Ambulatory Visit | Attending: Physician Assistant | Admitting: Physician Assistant

## 2011-03-29 DIAGNOSIS — Z1231 Encounter for screening mammogram for malignant neoplasm of breast: Secondary | ICD-10-CM

## 2011-03-31 ENCOUNTER — Other Ambulatory Visit: Payer: Self-pay | Admitting: Physician Assistant

## 2011-03-31 DIAGNOSIS — R928 Other abnormal and inconclusive findings on diagnostic imaging of breast: Secondary | ICD-10-CM

## 2011-04-05 ENCOUNTER — Telehealth: Payer: Self-pay | Admitting: Critical Care Medicine

## 2011-04-05 NOTE — Telephone Encounter (Addendum)
I called the number left for Susie x 2 and this is a Doctor, general practice company. Not [employer]! Will close the encounter.

## 2011-04-06 ENCOUNTER — Ambulatory Visit
Admission: RE | Admit: 2011-04-06 | Discharge: 2011-04-06 | Disposition: A | Discharge status: Home / Self Care | Payer: BC Managed Care – PPO | Source: Ambulatory Visit | Attending: Physician Assistant | Admitting: Physician Assistant

## 2011-04-06 ENCOUNTER — Other Ambulatory Visit: Payer: Self-pay | Admitting: Family Medicine

## 2011-04-06 DIAGNOSIS — R928 Other abnormal and inconclusive findings on diagnostic imaging of breast: Secondary | ICD-10-CM

## 2011-04-12 ENCOUNTER — Telehealth (HOSPITAL_COMMUNITY): Payer: Self-pay | Admitting: *Deleted

## 2011-04-12 ENCOUNTER — Ambulatory Visit (INDEPENDENT_AMBULATORY_CARE_PROVIDER_SITE_OTHER): Payer: BC Managed Care – PPO | Admitting: Family Medicine

## 2011-04-12 ENCOUNTER — Encounter: Payer: Self-pay | Admitting: Family Medicine

## 2011-04-12 ENCOUNTER — Telehealth (HOSPITAL_COMMUNITY): Payer: Self-pay | Admitting: Licensed Clinical Social Worker

## 2011-04-12 VITALS — HR 87 | Temp 98.3°F | Ht 64.0 in | Wt 194.0 lb

## 2011-04-12 DIAGNOSIS — R42 Dizziness and giddiness: Secondary | ICD-10-CM

## 2011-04-12 DIAGNOSIS — F329 Major depressive disorder, single episode, unspecified: Secondary | ICD-10-CM

## 2011-04-12 DIAGNOSIS — F341 Dysthymic disorder: Secondary | ICD-10-CM

## 2011-04-12 DIAGNOSIS — F29 Unspecified psychosis not due to a substance or known physiological condition: Secondary | ICD-10-CM

## 2011-04-12 DIAGNOSIS — R410 Disorientation, unspecified: Secondary | ICD-10-CM

## 2011-04-12 DIAGNOSIS — F32A Depression, unspecified: Secondary | ICD-10-CM

## 2011-04-12 DIAGNOSIS — H6980 Other specified disorders of Eustachian tube, unspecified ear: Secondary | ICD-10-CM

## 2011-04-12 MED ORDER — AMOXICILLIN-POT CLAVULANATE 875-125 MG PO TABS: 1.0000 | ORAL_TABLET | Freq: Two times a day (BID) | ORAL | Status: AC

## 2011-04-12 MED ORDER — FEXOFENADINE-PSEUDOEPHED ER 180-240 MG PO TB24: 1.0000 | ORAL_TABLET | Freq: Every day | ORAL | Status: DC

## 2011-04-12 NOTE — Patient Instructions (Addendum)
Near-Syncope Near-syncope is sudden weakness, dizziness, or feeling like you might pass out (faint). This may occur when getting up after sitting or while standing for a long period of time. Near-syncope can be caused by a drop in blood pressure. This is a common reaction, but it may occur to a greater degree in people taking medicines to control their blood pressure. Fainting often occurs when the blood pressure or pulse is too low to provide enough blood flow to the brain to keep you conscious. Fainting and near-syncope are not usually due to serious medical problems. However, certain people should be more cautious in the event of near-syncope, including elderly patients, patients with diabetes, and patients with a history of heart conditions (especially irregular rhythms).  CAUSES  Drop in blood pressure.  Physical pain.  Dehydration.  Heat exhaustion.  Emotional distress.  Low blood sugar.  Internal bleeding.  Heart and circulatory problems.  Infections.  SYMPTOMS  Dizziness.  Feeling sick to your stomach (nauseous).  Nearly fainting.  Body numbness.  Turning pale.  Tunnel vision.  Weakness.  HOME CARE INSTRUCTIONS  Lie down right away if you start feeling like you might faint. Breathe deeply and steadily. Wait until all the symptoms have passed. Most of these episodes last only a few minutes. You may feel tired for several hours.  Drink enough fluids to keep your urine clear or pale yellow.  If you are taking blood pressure or heart medicine, get up slowly, taking several minutes to sit and then stand. This can reduce dizziness that is caused by a drop in blood pressure.  SEEK IMMEDIATE MEDICAL CARE IF:  You have a severe headache.  Unusual pain develops in the chest, abdomen, or back.  There is bleeding from the mouth or rectum, or you have black or tarry stool.  An irregular heartbeat or a very rapid pulse develops.  You have repeated fainting or seizure-like jerking during an  episode.  You faint when sitting or lying down.  You develop confusion.  You have difficulty walking.  Severe weakness develops.  Vision problems develop.  MAKE SURE YOU:  Understand these instructions.  Will watch your condition.  Will get help right away if you are not doing well or get worse.  Document Released: 12/20/2004 Document Revised: 12/09/2010 Document Reviewed: 02/05/2010 [facility] Patient Information 2012 [place], [place].Barotitis Media Barotitis media is soreness (inflammation) of the area behind the eardrum (middle ear). This occurs when the auditory tube (Eustachian tube) leading from the back of the throat to the eardrum is blocked. When it is blocked air cannot move in and out of the middle ear to equalize pressure changes. These pressure changes come from changes in altitude when:  Flying.   Driving in the mountains.   Diving.  Problems are more likely to occur with pressure changes during times when you are congested as from:  Hay fever.   Upper respiratory infection.   A cold.  Damage or hearing loss (barotrauma) caused by this may be permanent. HOME CARE INSTRUCTIONS   Use medicines as recommended by your caregiver. Over the counter medicines will help unblock the canal and can help during times of air travel.   Do not put anything into your ears to clean or unplug them. Eardrops will not be helpful.   Do not swim, dive, or fly until your caregiver says it is all right to do so. If these activities are necessary, chewing gum with frequent swallowing may help. It is also helpful  to hold your nose and gently blow to pop your ears for equalizing pressure changes. This forces air into the Eustachian tube.   For little ones with problems, give your baby a bottle of water or juice during periods when pressure changes would be anticipated such as during take offs and landings associated with air travel.   Only take over-the-counter or prescription medicines for  pain, discomfort, or fever as directed by your caregiver.   A decongestant may be helpful in de-congesting the middle ear and make pressure equalization easier. This can be even more effective if the drops (spray) are delivered with the head lying over the edge of a bed with the head tilted toward the ear on the affected side.   If your caregiver has given you a follow-up appointment, it is very important to keep that appointment. Not keeping the appointment could result in a chronic or permanent injury, pain, hearing loss and disability. If there is any problem keeping the appointment, you must call back to this facility for assistance.  SEEK IMMEDIATE MEDICAL CARE IF:   You develop a severe headache, dizziness, severe ear pain, or bloody or pus-like drainage from your ears.   An oral temperature above 102 F (38.9 C) develops.   Your problems do not improve or become worse.  MAKE SURE YOU:   Understand these instructions.   Will watch your condition.   Will get help right away if you are not doing well or get worse.  Document Released: 12/18/1999 Document Revised: 12/09/2010 Document Reviewed: 07/26/2007 [facility] Patient Information 2012 [place], [place].depressioDepression, Adolescent and Adult Depression is a true and treatable medical condition. In general there are two kinds of depression:  Depression we all experience in some form. For example depression from the death of a loved one, financial distress or natural disasters will trigger or increase depression.   Clinical depression, on the other hand, appears without an apparent cause or reason. This depression is a disease. Depression may be caused by chemical imbalance in the body and brain or may come as a response to a physical illness. Alcohol and other drugs can cause depression.  DIAGNOSIS  The diagnosis of depression is usually based upon symptoms and medical history. TREATMENT  Treatments for depression fall into three  categories. These are:  Drug therapy. There are many medicines that treat depression. Responses may vary and sometimes trial and error is necessary to determine the best medicines and dosage for a particular patient.   Psychotherapy, also called talking treatments, helps people resolve their problems by looking at them from a different point of view and by giving people insight into their own personal makeup. Traditional psychotherapy looks at a childhood source of a problem. Other psychotherapy will look at current conflicts and move toward solving those. If the cause of depression is drug use, counseling is available to help abstain. In time the depression will usually improve. If there were underlying causes for the chemical use, they can be addressed.   ECT (electroconvulsive therapy) or shock treatment is not as commonly used today. It is a very effective treatment for severe suicidal depression. During ECT electrical impulses are applied to the head. These impulses cause a generalized seizure. It can be effective but causes a loss of memory for recent events. Sometimes this loss of memory may include the last several months.  Treat all depression or suicide threats as serious. Obtain professional help. Do not wait to see if serious depression will get better  over time without help. Seek help for yourself or those around you. In the [place] the number to the National Suicide Help Lines With 24 Hour Help Are: 1-800-SUICIDE 878 283 2344 Document Released: 12/18/1999 Document Revised: 12/09/2010 Document Reviewed: 08/08/2007 [facility] Patient Information 2012 [place], [place].Suicidal Feelings, How to Help Yourself Everyone feels sad or unhappy at times, but depressing thoughts and feelings of hopelessness can lead to thoughts of suicide. It can seem as if life is too tough to handle. It is as if the mountain is just too high and your climbing skills are not great enough. At that moment these dark  thoughts and feelings may seem overwhelming and never ending. It is important to remember these feelings are temporary! They will go away. If you feel as though you have reached the point where suicide is the only answer, it is time to let someone know immediately. This is the first step to feeling better. The following steps will move you to safer ground and lead you in a positive direction out of depression. HOW TO COPE AND PREVENT SUICIDE  Let family, friends, teachers and/or counselors know. Get help. Try not to isolate yourself from those who care about you. Even though you may not feel sociable or think that you are not good company, talk with someone everyday. It is best if it is face to face. Remember, they will want to help you.   Eat a regularly spaced and well-balanced diet, and get plenty of rest.   Avoid alcohol and drugs because they will only make you feel worse and may also lower your inhibitions. Remove them from the home. If you are thinking of taking an overdose of your prescribed medications, give your medicines to someone who can give them to you one day at a time. If you are on antidepressants, let your caregiver know of your feelings so he or she can provide a safer medication, if that is a concern.   Remove weapons or poisons from your home.   Try to stick to routines. That may mean just walking the dog or feeding the cat. Follow a schedule and remind yourself that you have to keep that schedule every day. Play with your pets. If it is possible, and you do not have a pet, get one. They give you a sense of well-being, lower your blood pressure and make your heart feel good. They need you, and we all want to be needed.   Set some realistic goals and achieve them. Make a list and cross things off as you go. Accomplishments give a sense of worth. Wait until you are feeling better before doing things you find difficult or unpleasant to do.   If you are able, try to start exercising.  Even half-hour periods of exercise each day will make you feel better. Getting out in the sun or into nature helps you recover from depression faster. If you have a favorite place to walk, take advantage of that.   Increase safe activities that have always given you pleasure. This may include playing your favorite music, reading a good book, painting a picture or playing your favorite instrument. Do whatever takes your mind off your depression and puts a smile on your face.   Keep your living space well lit with windows open, and let the sun shine in. Bright light definitely treats depression, not just people with the seasonal affective disorders (SAD).  Above all else remember, depression is temporary. It will go away. Do not contemplate  suicide. Death as a permanent solution is not the answer. Suicide will take away the beautiful rest of life, and do lifelong harm to those around you who love you. Help is available. National Suicide Help Lines with 24 hour help are: 1-800-SUICIDE 936-047-6398 Document Released: 06/26/2002 Document Revised: 12/09/2010 Document Reviewed: 11/14/2006 [facility] Patient Information 2012 [place], [place].

## 2011-04-12 NOTE — Progress Notes (Addendum)
Subjective:    Patient ID: Jennifer [name], female    DOB: 1960/09/19, 51 y.o.   MRN: [id number]  Sinusitis This is a new problem. The current episode started in the past 7 days. The problem has been gradually worsening since onset. Associated symptoms include headaches.   she feels as if this may be some other reason why she is dizzy. However she's also concerned because of multiple near falls making her very concerned that she has something more serious going on between her lupus asthma and depression.    Review of Systems  Constitutional: Positive for activity change.  Neurological: Positive for dizziness, weakness, light-headedness, numbness and headaches.  Psychiatric/Behavioral: Positive for suicidal ideas, confusion and dysphoric mood.      Pulse 87  Temp(Src) 98.3 F (36.8 C) (Oral)  Ht 5\' 4"  (1.626 m)  Wt 194 lb (87.998 kg)  BMI 33.30 kg/m2  SpO2 97% Objective:   Physical Exam  Constitutional: She is oriented to person, place, and time. She appears well-developed and well-nourished.  HENT:  Head: Normocephalic.  Neck: Normal range of motion. Neck supple.  Cardiovascular: Normal rate, regular rhythm and normal heart sounds.   Pulmonary/Chest: Effort normal and breath sounds normal.  Neurological: She is alert and oriented to person, place, and time.  Skin: Skin is warm and dry.  Psychiatric: Her speech is normal. Her mood appears anxious. She is withdrawn. Cognition and memory are impaired. She expresses inappropriate judgment. She exhibits a depressed mood. She expresses no suicidal plans.       Patient with multiple comments about suicide but no plans that she is verbalizing at this time.          Assessment & Plan:           #1 eustachian tube dysfunction. We'll place on Augmentin 875 and Allegra-D one tablet a day Return a month followup. #2 near syncopal episode/dizziness. Patient has had CT scan or MRI of the head is been over 5 years. Because of the  numbness of her symptoms are reported inability to concentrate and focus to the MRI will be more helpful to rule out MS or some complications from her lupus disease. #3 reactive airway disease asthma. Patient congratulated on not smoking and followup with pulmonologist in a month.  #3 suicidal ideation//depression. This is separate visit onto itself. Over 45 minutes was spent with the patient and over half this time was spent trying to fully evaluate patient. At least 3 different times patient expressed her frustration and desire or interest on ending her life. Each time patient when asked if there was a plan she denied any plans. Twice she mentioned that she would not do anything because of her husband who and children. She reports frustration though about dealing with recurrent episodes of depression. Apparently she's not taking her trazodone do to concern over sleeping too much nor is she taking the Klonopin even though she may take it twice a day because of which she feels is its inherent lack of efficacy. After multiple times suggesting she takes the medicine and she at least how she would do she has informed me for the third time that she doesn't know if she can survive her illness. At that time face-to-face after bluntly again if she wants to kill herself. She states quickly but he does not want to herself at this time but does not know about tomorrow. I strongly suggested that she contact her psychiatrist and try to arrange an appointment ASAP.  She has asked me to call her psychiatrist which I declined since she does have the phone and is able to use the phone. I emphatically explained to her that if her psychiatrist tells me to call 911 and she states that he is told her in the past to do that I would do it. However I did explain to her why and trying to treat her medical problems I will decline adjusting her psychiatric medications. As her prescriptions were being entered into the system her mental  health office downstairs in this building called and requested that we contact 911 and get her to the emergency room. This was done quickly and while officers and EMS were on the way to the office she came into the hallway and expressed was that she wanted to leave and asked what she was waiting for. She was informed of the recommendation by her mental health office and that it had been done. She quickly left the office minutes before the arrival of law-enforcement. Afterwards this was reiterated as far as the events to both the no health office and to 2 different officers of law. It was clearly expressed by me that she did not want to hurt herself at this time.

## 2011-04-12 NOTE — Telephone Encounter (Signed)
Already documented phone call

## 2011-04-12 NOTE — Telephone Encounter (Signed)
Pt called states that she is at Dr. Hedy Camara office and was told to contact Dr. Christell Constant because she is feeling very suicidal. Pt was advised and Dr.s nurse were both advised pt should be sent to the ed or call 911 to be transported to the ed

## 2011-04-14 ENCOUNTER — Ambulatory Visit (INDEPENDENT_AMBULATORY_CARE_PROVIDER_SITE_OTHER): Payer: BC Managed Care – PPO | Admitting: Licensed Clinical Social Worker

## 2011-04-14 ENCOUNTER — Other Ambulatory Visit (HOSPITAL_COMMUNITY): Payer: Self-pay | Admitting: Psychiatry

## 2011-04-14 DIAGNOSIS — F329 Major depressive disorder, single episode, unspecified: Secondary | ICD-10-CM

## 2011-04-14 DIAGNOSIS — F3289 Other specified depressive episodes: Secondary | ICD-10-CM

## 2011-04-14 DIAGNOSIS — F32A Depression, unspecified: Secondary | ICD-10-CM

## 2011-04-14 NOTE — Progress Notes (Signed)
   THERAPIST PROGRESS NOTE  Session Time: 2:00 - 3:00  Participation Level: Active  Behavioral Response: Well GroomedAlertAngry and Euthymic  Type of Therapy: Individual Therapy  Treatment Goals addressed: Anxiety  Interventions: Motivational Interviewing, Solution Focused and Supportive  Summary: Jennifer [name] is [name] 51 y.o. female who presents with depression and anxiety.  Today she reported on the problem on Tuesday.  She did end up feeling angry about all of the miscommunication because it was unnecessary to have called the police.  She tried to leave before they came but they found her and she was so nervous. She remembers watching her daughter being taken away by the police.  They talked to her for awhile and she went home.  There was miscommunication between the clinics which caused the problems.  Patient was informed that we would get clear about the communication problem and we are certainly sorry that she had added stress.  Encouraged her to directly call me if she was having difficulty again.  She was clear that she is not feeling suicidal.   She is excited because she is getting pictures of her grandson from her son.  She let him know how happy she was and made some statement about hoping they could work out their differences and that seemed to have set him off again.  Her son seems to feel that his mother critical of him but it seems like he is interpreting disagreement as critical.  She is just going to make [name] simple short response and not get into anything more from him.  She feels that no matter what she says, it is wrong and offensive to him.Jennifer [name] wants to return to counseling to deal with issues again. .   Suicidal/Homicidal: Nowithout intent/plan  Therapist Response: On Tues she was in Urgent Care and was feeling suicidal but said she would not do anything. Her report is that when the pain from her Lupus comes, she does feel like she cannot live with it anymore.  She can  stop thinking about suicide if she is able to talk with someone which soothes her.    Plan: Return again in 1 weeks.  Diagnosis: Axis I: Depressive Disorder NOS    Axis II: Deferred    Jennifer [name],Jennifer A, LCSW 04/14/2011

## 2011-04-15 ENCOUNTER — Telehealth: Payer: Self-pay | Admitting: *Deleted

## 2011-04-15 MED ORDER — OXYBUTYNIN CHLORIDE ER 5 MG PO TB24: 5.0000 mg | ORAL_TABLET | Freq: Every day | ORAL | Status: DC

## 2011-04-15 NOTE — Telephone Encounter (Signed)
Pt calls and would like a refill on the Ditropan- forgot to get it when in for her visit the other day with Dr. Thurmond Butts. Needs the brand name sent it works better for her than the generic form. KJ LPN

## 2011-04-15 NOTE — Telephone Encounter (Signed)
Sent to pharmacy 

## 2011-04-21 ENCOUNTER — Ambulatory Visit (INDEPENDENT_AMBULATORY_CARE_PROVIDER_SITE_OTHER): Payer: BC Managed Care – PPO | Admitting: Licensed Clinical Social Worker

## 2011-04-21 ENCOUNTER — Telehealth (HOSPITAL_COMMUNITY): Payer: Self-pay | Admitting: Psychiatry

## 2011-04-21 DIAGNOSIS — F329 Major depressive disorder, single episode, unspecified: Secondary | ICD-10-CM

## 2011-04-21 DIAGNOSIS — F3289 Other specified depressive episodes: Secondary | ICD-10-CM

## 2011-04-21 DIAGNOSIS — F32A Depression, unspecified: Secondary | ICD-10-CM

## 2011-04-21 NOTE — Progress Notes (Signed)
   THERAPIST PROGRESS NOTE  Session Time: 12:00 - 1:00  Participation Level: Active  Behavioral Response: Well GroomedAlertDepressed  Type of Therapy: Individual Therapy  Treatment Goals addressed: Anxiety and Coping  Interventions: Motivational Interviewing, Strength-based and Supportive  Summary: Jennifer [name] is a 51 y.o. female who presents with depression and anxiety.  Jennifer [name] has an earlier appointment with Dr. Christell Constant.  (Dr. Christell Constant was out of the office this week).  It does not feel soon enough for [facility] for she feels her meds are not working anymore - Klonopin used to calm her down and now it does not seem to do anything.  Discussed how change of medication may not work immediately so we needed to figure out what would support her through this crisis.  Contact with her husband helps for he is nurturing and understanding of her medical problems. Counselor suggested that I could call her everyday until she sees Dr. Christell Constant.  She admitted that it would but felt that "it was over and above the call of duty".  Let her know that any support that would be helpful was "my duty".  However, if she felt she might act on self destructive feelings, she needed to be in the hospital to be safe.  So if she was concerned about her actions, she needed to go to ER to be assessed for hospitalization. She agreed to follow through if necessary.  She was reporting again about her son's attitude - she really knows that it is hard to predict what he is going to react to.  It would be a waste of her time and energy to anticipate him.  She does know that she has to stay non- reactive.  She is angry with her daughter who just blew 13,000 dollars and her husband signed for a loan for her.  She is also angry with husband who is not handling money very well.  He will not listen to her and accept her way of wanting to deal with money.  She is supposed to go to visit her father and brother in [place] and she knows they do  not have the money but she feels some obligation to go. They also have one of her daughter's wedding and an agreement to pay for her wedding gown. Her father wants her to come in June when it is too hot for her.  She wants to go in May for it will be easier on her and her Lupus.  Suggested that she needed to consider her own needs.  Her father seems to refuse to understand her illness.  Suicidal/Homicidal: She is having feelings of not wanting to live with the pain but no plan to commit suicide.  Therapist Response: Jennifer [name] has said she wants to die but she has no plan or intent - her anxiety and pain gets overwhelming at times and she does not know if she can go on. Therapist has a good relationship with Jennifer [name] - have seen her since earlier in 2012      So feel a telephone contact that she knows will happen every day will "hold her".   It will also give me an opportunity to assess her daily.  Plan: Return again in 1 weeks.  Diagnosis: Axis I: Depressive Disorder NOS    Axis II: Deferred    Almir Botts,JUDITH A, LCSW 04/21/2011

## 2011-04-22 MED ORDER — CLONAZEPAM 1 MG PO TABS: 0.5000 mg | ORAL_TABLET | Freq: Three times a day (TID) | ORAL | Status: DC | PRN

## 2011-04-22 NOTE — Telephone Encounter (Signed)
The patient reports that she had suicidal ideation several days ago and was seen by her therapist. The pharmacy called and reported that the patient required a refill on clonazepam. She has an appointment with Dr. Christell Constant on 04/26/11. At 2:30 PM.  Patient reports that she continues to take clonazepam, which she completes yesterday, as well as Paxil. She reports she use only one to two tablets of trazodone. Patient reports she had taken upto three mg at one time and feels it did not help.  PLAN: 1. At this point, to prevent withdrawal symptoms and treat anxiety will give enough clonazepam to last to her next appointment on 04/26/11 at 2 PM. Will change prescription to Clonazepam 0.5 mg TID PRN, #12, to last to her next appointment. 2. Patient advised to call 911/or go to ER with any suicidal or homicidal ideation, intent, or plans. Patient has crisis numbers to call. Discussed safety plan. Continue therapy.

## 2011-04-25 ENCOUNTER — Telehealth (HOSPITAL_COMMUNITY): Payer: Self-pay | Admitting: Licensed Clinical Social Worker

## 2011-04-26 ENCOUNTER — Encounter (HOSPITAL_COMMUNITY): Payer: BC Managed Care – PPO | Admitting: Psychiatry

## 2011-04-27 ENCOUNTER — Ambulatory Visit (INDEPENDENT_AMBULATORY_CARE_PROVIDER_SITE_OTHER): Payer: BC Managed Care – PPO | Admitting: Psychiatry

## 2011-04-27 ENCOUNTER — Telehealth: Payer: Self-pay | Admitting: *Deleted

## 2011-04-27 ENCOUNTER — Encounter (HOSPITAL_COMMUNITY): Payer: Self-pay | Admitting: Psychiatry

## 2011-04-27 VITALS — BP 123/93 | HR 79 | Ht 64.0 in | Wt 196.0 lb

## 2011-04-27 DIAGNOSIS — F411 Generalized anxiety disorder: Secondary | ICD-10-CM

## 2011-04-27 MED ORDER — LAMOTRIGINE 25 MG PO TABS: 150.0000 mg | ORAL_TABLET | Freq: Every day | ORAL | Status: DC

## 2011-04-27 MED ORDER — LAMOTRIGINE 150 MG PO TABS: ORAL_TABLET | ORAL | Status: DC

## 2011-04-27 MED ORDER — PAROXETINE HCL 40 MG PO TABS: ORAL_TABLET | ORAL | Status: DC

## 2011-04-27 MED ORDER — CLONAZEPAM 1 MG PO TABS: 0.5000 mg | ORAL_TABLET | Freq: Three times a day (TID) | ORAL | Status: DC | PRN
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
## 2011-04-27 NOTE — Telephone Encounter (Signed)
Obtained PA for MRI of the head per last ov note. Need to change order to MRI of head instead of angiogram

## 2011-04-28 ENCOUNTER — Ambulatory Visit (INDEPENDENT_AMBULATORY_CARE_PROVIDER_SITE_OTHER): Payer: BC Managed Care – PPO | Admitting: Licensed Clinical Social Worker

## 2011-04-28 DIAGNOSIS — F3289 Other specified depressive episodes: Secondary | ICD-10-CM

## 2011-04-28 DIAGNOSIS — F32A Depression, unspecified: Secondary | ICD-10-CM

## 2011-04-28 DIAGNOSIS — F329 Major depressive disorder, single episode, unspecified: Secondary | ICD-10-CM

## 2011-04-28 DIAGNOSIS — F411 Generalized anxiety disorder: Secondary | ICD-10-CM

## 2011-04-28 LAB — DRUGS OF ABUSE SCREEN W/O ALC, ROUTINE URINE
Amphetamine Screen, Ur: NEGATIVE
Benzodiazepines.: NEGATIVE
Cocaine Metabolites: NEGATIVE
Methadone: NEGATIVE
Phencyclidine (PCP): NEGATIVE

## 2011-04-28 NOTE — Telephone Encounter (Signed)
MRI was cancelled. Pt states she will be following up in 2-3 weeks.

## 2011-04-28 NOTE — Telephone Encounter (Signed)
From what  I understand the patient called Jennifer [name] this morning since she's feeling better and requests cancel the test. She will see me back in the office as scheduled.

## 2011-04-28 NOTE — Progress Notes (Signed)
   THERAPIST PROGRESS NOTE  Session Time: 2:00 - 3:00  Participation Level: Active  Behavioral Response: Well GroomedAlertEuthymic  Type of Therapy: Individual Therapy  Treatment Goals addressed: Anxiety  Interventions: Motivational Interviewing and Supportive  Summary: Jennifer [name] is a 51 y.o. female who presents with de;pression and anxiety in relation to medical problems..  Today Jennifer [name] came in in a very pleasant mood.  She stated that she felt a lot better today and she just loved having a good day.  She feels it may be related to the fact that she had to get up earlly yesterday morning to come to the doctor and she dedided to get up early today.  She feels getting up and starting her day earlier was helpful to her mood.  She also stayed away from the computer for she feels she spends too much time on it.  She loves the sun and she cannot be in it because of her Lupus but she can enjoy by at least looking outside and being  In a protected place outside.  She admits that she may have contributed to her mood by staying in bed too much and getting absorbed in the computer.which was a waste of time.  Her husband is very supportive and he is controlling her meds for the time being.  She has no other stashes of meds anywhere.  She does know what to do in case of an emergency.  Reviewed safety plan. She talked about her behavior in some meetings where she got angry and did not even remember what she said.  She had to then go make amends with everyone.  In trying to figure out her trigger to reacting - she felt it was around the issue of control and her not getting her way.  This issue has probably effected her life a lot - for it may be one of those areas that she has damaged relationships with her children - this was not discussed so need to bring that up next time.  When children are growing there a lot of issues that come up around control so she may have said things that were critical and she  does not remember them. At the meetings, she may have good reasons to be upset but she did not need to react that way - it certainly felt uncomfortable afterwards and it may have cause a total breach with some people.  She left saying that she was going to work on having a good day.  Suicidal/Homicidal: Nowithout intent/plan  Therapist Response: Jacqulin has been having suicidal ideation - reporting not having today and she has given medications to husband to control for the time being  Plan: Return again in 2 weeks.  Diagnosis: Axis I: Anxiety Disorder NOS and Depressive Disorder NOS    Axis II: Deferred    Lee Kalt,JUDITH A, LCSW 04/28/2011

## 2011-04-29 NOTE — Progress Notes (Signed)
This encounter was created in error - please disregard.

## 2011-05-02 ENCOUNTER — Other Ambulatory Visit: Payer: Self-pay

## 2011-05-06 ENCOUNTER — Ambulatory Visit (HOSPITAL_COMMUNITY): Payer: Self-pay | Admitting: Psychiatry

## 2011-05-10 ENCOUNTER — Ambulatory Visit (HOSPITAL_COMMUNITY): Payer: Self-pay | Admitting: Licensed Clinical Social Worker

## 2011-05-10 ENCOUNTER — Telehealth (HOSPITAL_COMMUNITY): Payer: Self-pay | Admitting: Licensed Clinical Social Worker

## 2011-05-11 ENCOUNTER — Ambulatory Visit (HOSPITAL_COMMUNITY): Payer: Self-pay | Admitting: Psychiatry

## 2011-05-17 ENCOUNTER — Ambulatory Visit (INDEPENDENT_AMBULATORY_CARE_PROVIDER_SITE_OTHER): Payer: BC Managed Care – PPO | Admitting: Psychiatry

## 2011-05-17 ENCOUNTER — Encounter (HOSPITAL_COMMUNITY): Payer: Self-pay | Admitting: Psychiatry

## 2011-05-17 VITALS — BP 130/89 | HR 73 | Ht 64.0 in | Wt 206.0 lb

## 2011-05-17 DIAGNOSIS — F411 Generalized anxiety disorder: Secondary | ICD-10-CM

## 2011-05-17 DIAGNOSIS — G4700 Insomnia, unspecified: Secondary | ICD-10-CM

## 2011-05-17 MED ORDER — LAMOTRIGINE 150 MG PO TABS: ORAL_TABLET | ORAL | Status: DC

## 2011-05-17 MED ORDER — TRAZODONE HCL 50 MG PO TABS: ORAL_TABLET | ORAL | Status: DC

## 2011-05-17 MED ORDER — LAMOTRIGINE 25 MG PO TABS: ORAL_TABLET | ORAL | Status: DC
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
## 2011-05-19 ENCOUNTER — Encounter: Payer: Self-pay | Admitting: Critical Care Medicine

## 2011-05-19 ENCOUNTER — Ambulatory Visit (INDEPENDENT_AMBULATORY_CARE_PROVIDER_SITE_OTHER): Payer: BC Managed Care – PPO | Admitting: Critical Care Medicine

## 2011-05-19 ENCOUNTER — Encounter (HOSPITAL_COMMUNITY): Payer: Self-pay | Admitting: Psychiatry

## 2011-05-19 VITALS — BP 126/92 | HR 73 | Temp 98.4°F | Ht 64.0 in | Wt 198.0 lb

## 2011-05-19 DIAGNOSIS — J449 Chronic obstructive pulmonary disease, unspecified: Secondary | ICD-10-CM

## 2011-05-19 MED ORDER — ESOMEPRAZOLE MAGNESIUM 40 MG PO CPDR: 40.0000 mg | DELAYED_RELEASE_CAPSULE | Freq: Every day | ORAL | Status: DC

## 2011-05-19 NOTE — Progress Notes (Signed)
Subjective:    Patient ID: Jennifer [name], female    DOB: Oct 05, 1960, 51 y.o.   MRN: [id number]  HPI 05/19/2011 Notes dyspnea is better. Not having to sigh as much.  Cough is still present but is less. Cough is dry and productive cleasr mucus.  Still having some heartburn Pt denies any significant sore throat, nasal congestion or excess secretions, fever, chills, sweats, unintended weight loss, pleurtic or exertional chest pain, orthopnea PND, or leg swelling Pt denies any increase in rescue therapy over baseline, denies waking up needing it or having any early am or nocturnal exacerbations of coughing/wheezing/or dyspnea. Pt also denies any obvious fluctuation in symptoms with  weather or environmental change or other alleviating or aggravating factors At last ov we rec Stop ALL OIL based vitamins: krill, primrose, black cohash Stop spiriva and symbicort Start Qvar two puff twice daily Resume nexium one daily Hx of mild sleep apnea.  Cpap never Rx.     Past Medical History  Diagnosis Date  . Discoid lupus   . Thyroid disease   . Perimenopausal   . Fibromyalgia   . CFS (chronic fatigue syndrome)   . Alcohol abuse   . Sleep apnea   . Asthmatic bronchitis 2013  . GERD (gastroesophageal reflux disease) 2013     Family History  Problem Relation Age of Onset  . Hypertension Father   . Alcohol abuse Father   . Heart disease Sister 45    heart attack  . Heart attack Sister   . Depression Brother   . Alcohol abuse Brother     In remission  . Cancer Mother 62    colon  . Depression Brother   . Alcohol abuse Maternal Aunt   . Alcohol abuse Paternal Uncle     in remission  . Bipolar disorder Daughter 77    Suicide attempt     History   Social History  . Marital Status: Married    Spouse Name: N/A    Number of Children: 4  . Years of Education: N/A   Occupational History  .     Social History Main Topics  . Smoking status: Current Everyday Smoker -- 0.5 packs/day  for 36 years    Types: Cigarettes  . Smokeless tobacco: Never Used   Comment: Started smoking at age 33.  Marland Kitchen Alcohol Use: No     quit 01-2006/prior alcoholic-in AA  . Drug Use: No  . Sexually Active: Not on file   Other Topics Concern  . Not on file   Social History Narrative  . No narrative on file     Allergies  Allergen Reactions  . Acetaminophen     Pt states she is unable to take this bc of Hosimonto Thyroid  . Hydrocodone-Acetaminophen     REACTION: agitation  . Oxycodone     Hallucinating, sweating, itching Pt states she can take this in small doses     Outpatient Prescriptions Prior to Visit  Medication Sig Dispense Refill  . albuterol (VENTOLIN HFA) 108 (90 BASE) MCG/ACT inhaler Inhale 2 puffs into the lungs every 4 (four) hours as needed for wheezing.  1 Inhaler  11  . beclomethasone (QVAR) 40 MCG/ACT inhaler Inhale 2 puffs into the lungs 2 (two) times daily.  1 Inhaler  2  . chloroquine (ARALEN) 250 MG tablet Take 125 mg by mouth daily.       . clonazePAM (KLONOPIN) 1 MG tablet Take 0.5 tablets (0.5 mg total) by mouth 3 (  three) times daily as needed for anxiety.  90 tablet  0  . cyclobenzaprine (FLEXERIL) 10 MG tablet Take 10 mg by mouth as needed.       . folic acid (FOLVITE) 1 MG tablet Take 1 mg by mouth daily.        Marland Kitchen gabapentin (NEURONTIN) 300 MG capsule TAKE ONE CAPSULE BY MOUTH THREE TIMES DAILY  270 capsule  1  . lamoTRIgine (LAMICTAL) 150 MG tablet Take one 150 mg tablet with a 25 mg tablet for a total of 175 mg daily.  30 tablet  1  . lamoTRIgine (LAMICTAL) 25 MG tablet Take 1 tablet (25 mg total) by mouth daily with 150 mg daily for a total of 175 mg daily.  30 tablet  1  . levothyroxine (SYNTHROID, LEVOTHROID) 150 MCG tablet TAKE 1 TABLET BY MOUTH EVERY DAY  30 tablet  2  . methotrexate 2.5 MG tablet Take by mouth once a week. Take 6 tabs weekly of the 2.5 mg.      . oxybutynin (DITROPAN-XL) 5 MG 24 hr tablet Take 1 tablet (5 mg total) by mouth daily.   30 tablet  3  . PARoxetine (PAXIL) 40 MG tablet Take 1 tablet po daily  30 tablet  5  . Spacer/Aero-Holding Chambers (AEROCHAMBER MV) inhaler by Other route. Use as instructed with albuterol hfa      . traMADol (ULTRAM) 50 MG tablet Take 50 mg by mouth 4 (four) times daily as needed.      . traZODone (DESYREL) 50 MG tablet 3-4 tablets by mouth at bedtime as needed  120 tablet  1  . fexofenadine-pseudoephedrine (ALLEGRA-D 24 HOUR) 180-240 MG per 24 hr tablet Take 1 tablet by mouth daily.  30 tablet  1  . Cyanocobalamin (VITAMELTS ENERGY VITAMIN B-12) 1500 MCG TBDP Take 1 tablet by mouth daily.      Marland Kitchen esomeprazole (NEXIUM) 40 MG capsule Take 1 capsule (40 mg total) by mouth daily.  30 capsule  6  . furosemide (LASIX) 20 MG tablet Take 20 mg by mouth daily as needed.      . Ginkgo Biloba 60 MG CAPS Take 1 capsule by mouth daily.          Review of Systems  Constitutional: Positive for diaphoresis, activity change, appetite change and fatigue. Negative for unexpected weight change.  HENT: Positive for hearing loss, congestion, facial swelling, mouth sores, trouble swallowing, neck stiffness, voice change, sinus pressure and tinnitus. Negative for nosebleeds, sneezing, dental problem and ear discharge.   Eyes: Positive for visual disturbance. Negative for photophobia, discharge and itching.  Respiratory: Positive for choking and chest tightness. Negative for apnea and stridor.   Cardiovascular: Negative for palpitations.  Gastrointestinal: Positive for nausea and abdominal distention. Negative for constipation and blood in stool.  Genitourinary: Positive for frequency. Negative for dysuria, urgency, hematuria, flank pain, decreased urine volume and difficulty urinating.  Musculoskeletal: Positive for back pain, joint swelling, arthralgias and gait problem.  Skin: Positive for color change and pallor.  Neurological: Positive for dizziness, speech difficulty, weakness and light-headedness. Negative  for tremors, seizures, syncope and numbness.  Hematological: Negative for adenopathy. Bruises/bleeds easily.  Psychiatric/Behavioral: Positive for confusion and sleep disturbance. Negative for agitation. The patient is nervous/anxious.        Objective:   Physical Exam   Filed Vitals:   05/19/11 1342  BP: 126/92  Pulse: 73  Temp: 98.4 F (36.9 C)  TempSrc: Oral  Height: 5\' 4"  (1.626 m)  Weight: 198 lb (89.812 kg)  SpO2: 96%    Gen: Pleasant, well-nourished, in no distress,  normal affect  ENT: No lesions,  mouth clear,  oropharynx clear, no postnasal drip  Neck: No JVD, no TMG, no carotid bruits  Lungs: No use of accessory muscles, no dullness to percussion, clear without rales or rhonchi  Cardiovascular: RRR, heart sounds normal, no murmur or gallops, no peripheral edema  Abdomen: soft and NT, no HSM,  BS normal  Musculoskeletal: No deformities, no cyanosis or clubbing  Neuro: alert, non focal  Skin: Warm, no lesions or rashes  Cleda Daub 03/17/2011 Normal   CXR 01/26/11: NAD     Assessment & Plan:   Obstructive chronic bronchitis without exacerbation Cyclic cough with recurrent tracheobronchitis exacerbated by reflux disease Plan Maintain Qvar daily Maintain Nexium daily Follow reflux diet Return 3 months     Updated Medication List Outpatient Encounter Prescriptions as of 05/19/2011  Medication Sig Dispense Refill  . albuterol (VENTOLIN HFA) 108 (90 BASE) MCG/ACT inhaler Inhale 2 puffs into the lungs every 4 (four) hours as needed for wheezing.  1 Inhaler  11  . beclomethasone (QVAR) 40 MCG/ACT inhaler Inhale 2 puffs into the lungs 2 (two) times daily.  1 Inhaler  2  . chloroquine (ARALEN) 250 MG tablet Take 125 mg by mouth daily.       . clonazePAM (KLONOPIN) 1 MG tablet Take 0.5 tablets (0.5 mg total) by mouth 3 (three) times daily as needed for anxiety.  90 tablet  0  . cyclobenzaprine (FLEXERIL) 10 MG tablet Take 10 mg by mouth as needed.       .  folic acid (FOLVITE) 1 MG tablet Take 1 mg by mouth daily.        Marland Kitchen gabapentin (NEURONTIN) 300 MG capsule TAKE ONE CAPSULE BY MOUTH THREE TIMES DAILY  270 capsule  1  . lamoTRIgine (LAMICTAL) 150 MG tablet Take one 150 mg tablet with a 25 mg tablet for a total of 175 mg daily.  30 tablet  1  . lamoTRIgine (LAMICTAL) 25 MG tablet Take 1 tablet (25 mg total) by mouth daily with 150 mg daily for a total of 175 mg daily.  30 tablet  1  . levothyroxine (SYNTHROID, LEVOTHROID) 150 MCG tablet TAKE 1 TABLET BY MOUTH EVERY DAY  30 tablet  2  . methotrexate 2.5 MG tablet Take by mouth once a week. Take 6 tabs weekly of the 2.5 mg.      . oxybutynin (DITROPAN-XL) 5 MG 24 hr tablet Take 1 tablet (5 mg total) by mouth daily.  30 tablet  3  . PARoxetine (PAXIL) 40 MG tablet Take 1 tablet po daily  30 tablet  5  . Spacer/Aero-Holding Chambers (AEROCHAMBER MV) inhaler by Other route. Use as instructed with albuterol hfa      . traMADol (ULTRAM) 50 MG tablet Take 50 mg by mouth 4 (four) times daily as needed.      . traZODone (DESYREL) 50 MG tablet 3-4 tablets by mouth at bedtime as needed  120 tablet  1  . esomeprazole (NEXIUM) 40 MG capsule Take 1 capsule (40 mg total) by mouth daily.  30 capsule  6  . fexofenadine-pseudoephedrine (ALLEGRA-D 24 HOUR) 180-240 MG per 24 hr tablet Take 1 tablet by mouth daily.  30 tablet  1  . DISCONTD: Cyanocobalamin (VITAMELTS ENERGY VITAMIN B-12) 1500 MCG TBDP Take 1 tablet by mouth daily.      Marland Kitchen DISCONTD: esomeprazole (NEXIUM) 40 MG  capsule Take 1 capsule (40 mg total) by mouth daily.  30 capsule  6  . DISCONTD: furosemide (LASIX) 20 MG tablet Take 20 mg by mouth daily as needed.      Marland Kitchen DISCONTD: Ginkgo Biloba 60 MG CAPS Take 1 capsule by mouth daily.

## 2011-05-19 NOTE — Assessment & Plan Note (Signed)
Cyclic cough with recurrent tracheobronchitis exacerbated by reflux disease Plan Maintain Qvar daily Maintain Nexium daily Follow reflux diet Return 3 months

## 2011-05-19 NOTE — Patient Instructions (Signed)
Stay on nexium daily Follow reflux diet Stay on Qvar Use oxygen at night Return 3 months

## 2011-05-20 ENCOUNTER — Ambulatory Visit (INDEPENDENT_AMBULATORY_CARE_PROVIDER_SITE_OTHER): Payer: BC Managed Care – PPO | Admitting: Licensed Clinical Social Worker

## 2011-05-20 ENCOUNTER — Telehealth (HOSPITAL_COMMUNITY): Payer: Self-pay | Admitting: Psychiatry

## 2011-05-20 DIAGNOSIS — F329 Major depressive disorder, single episode, unspecified: Secondary | ICD-10-CM
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
## 2011-05-20 NOTE — Telephone Encounter (Signed)
Message copied by Larena Sox on Fri May 20, 2011  3:32 PM ------      Message from: Larena Sox      Created: Thu May 19, 2011  5:26 AM       Call in clonazepam

## 2011-05-23 ENCOUNTER — Telehealth (HOSPITAL_COMMUNITY): Payer: Self-pay | Admitting: Psychiatry

## 2011-05-23 NOTE — Telephone Encounter (Signed)
Message copied by Larena Sox on Mon May 23, 2011  4:29 PM ------      Message from: Larena Sox      Created: Thu May 19, 2011  5:26 AM       Call in clonazepam

## 2011-05-24 ENCOUNTER — Other Ambulatory Visit (HOSPITAL_COMMUNITY): Payer: Self-pay | Admitting: Psychiatry

## 2011-05-24 DIAGNOSIS — F411 Generalized anxiety disorder: Secondary | ICD-10-CM

## 2011-05-24 MED ORDER — CLONAZEPAM 1 MG PO TABS: 0.5000 mg | ORAL_TABLET | Freq: Three times a day (TID) | ORAL | Status: DC | PRN

## 2011-05-27 ENCOUNTER — Ambulatory Visit (INDEPENDENT_AMBULATORY_CARE_PROVIDER_SITE_OTHER): Payer: BC Managed Care – PPO | Admitting: Licensed Clinical Social Worker

## 2011-05-27 DIAGNOSIS — F32A Depression, unspecified: Secondary | ICD-10-CM

## 2011-05-27 DIAGNOSIS — F3289 Other specified depressive episodes: Secondary | ICD-10-CM

## 2011-05-27 DIAGNOSIS — F329 Major depressive disorder, single episode, unspecified: Secondary | ICD-10-CM
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
## 2011-06-03 ENCOUNTER — Ambulatory Visit (HOSPITAL_COMMUNITY): Payer: Self-pay | Admitting: Licensed Clinical Social Worker

## 2011-06-10 ENCOUNTER — Ambulatory Visit (INDEPENDENT_AMBULATORY_CARE_PROVIDER_SITE_OTHER): Payer: BC Managed Care – PPO | Admitting: Licensed Clinical Social Worker

## 2011-06-10 DIAGNOSIS — F329 Major depressive disorder, single episode, unspecified: Secondary | ICD-10-CM

## 2011-06-10 DIAGNOSIS — F32A Depression, unspecified: Secondary | ICD-10-CM

## 2011-06-10 DIAGNOSIS — F3289 Other specified depressive episodes: Secondary | ICD-10-CM
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
## 2011-06-15 ENCOUNTER — Ambulatory Visit (INDEPENDENT_AMBULATORY_CARE_PROVIDER_SITE_OTHER): Payer: BC Managed Care – PPO | Admitting: Licensed Clinical Social Worker

## 2011-06-15 DIAGNOSIS — F3289 Other specified depressive episodes: Secondary | ICD-10-CM

## 2011-06-15 DIAGNOSIS — F32A Depression, unspecified: Secondary | ICD-10-CM

## 2011-06-15 DIAGNOSIS — F329 Major depressive disorder, single episode, unspecified: Secondary | ICD-10-CM

## 2011-06-15 NOTE — Progress Notes (Signed)
   THERAPIST PROGRESS NOTE  Session Time: 12:00 - 1:00  Participation Level: Active  Behavioral Response: NeatAlertDepressed and Euthymic  Type of Therapy: Individual Therapy  Treatment Goals addressed: Anxiety and Coping  Interventions: Motivational Interviewing, Supportive and Reframing  Summary: Jennifer [name] is a 51 y.o. female who presents with problems with depression.  Tanaysha reported she feels a lot better today but she does fear that if she starts talking that she will cry. Reassured her that this was the place to do that.  She does not know what happened on Sat and Sun - the pain and fatigue were high.  We talked on the phone on Monday and she could not believe that things as simple as drinking and eating were some things suggested and she did feel better.   Believe she gets so caught up in how she is feeling that she forgets to eat or drink and could believe she had not bathe.   She feel like she is sinking in a deep black hole.  She did make herself go to the picnic and she sat in the car a lot of the time. After we talked on Mon, she did call her sponsor and shared some about her depression and her sponsor reassured her that she suffered with depression also. She apparently does not tell people that she suffers from depression. That sharing was good for her  She is getting anxious because she sees herself as less able to do things for herself and she finds herself asking her husband to help her more. She has anxiety that she will end up in a wheelchair.  She went to the mall and was exhausted after going to one or two stores.- then she could not find her vehicle and had to ask security to help her.  Discussed how accepting limitations can be difficult but it is a necessary part of life.- either the aging process or an illness.  She goes to a dark place where she spins out of control and cannot get the helpless feelings and thoughts out of her head.  Her friend who died last year had to  ask her husband for more help.  She does believe a lot of this comes from isolating. It starts with an increase in pain and fatigue. .   Suicidal/Homicidal: Nowithout intent/plan  Plan: Return again in 1 weeks.  Diagnosis: Axis I: Depressive Disorder NOS    Axis II: Deferred    Xareni Kelch,JUDITH A, LCSW 06/15/2011

## 2011-06-21 ENCOUNTER — Ambulatory Visit (INDEPENDENT_AMBULATORY_CARE_PROVIDER_SITE_OTHER): Payer: BC Managed Care – PPO | Admitting: Licensed Clinical Social Worker

## 2011-06-28 ENCOUNTER — Other Ambulatory Visit: Payer: Self-pay | Admitting: Critical Care Medicine

## 2011-06-28 MED ORDER — BECLOMETHASONE DIPROPIONATE 40 MCG/ACT IN AERS: 2.0000 | INHALATION_SPRAY | Freq: Two times a day (BID) | RESPIRATORY_TRACT | Status: DC

## 2011-06-28 NOTE — Telephone Encounter (Signed)
[facility] REQUESTING QVAR 40 MCG RX SENT

## 2011-06-30 ENCOUNTER — Encounter (HOSPITAL_COMMUNITY): Payer: Self-pay | Admitting: Psychiatry

## 2011-06-30 ENCOUNTER — Ambulatory Visit (INDEPENDENT_AMBULATORY_CARE_PROVIDER_SITE_OTHER): Payer: BC Managed Care – PPO | Admitting: Psychiatry

## 2011-06-30 VITALS — BP 135/86 | HR 95 | Ht 64.0 in | Wt 201.0 lb

## 2011-06-30 DIAGNOSIS — G4700 Insomnia, unspecified: Secondary | ICD-10-CM

## 2011-06-30 DIAGNOSIS — F332 Major depressive disorder, recurrent severe without psychotic features: Secondary | ICD-10-CM

## 2011-06-30 DIAGNOSIS — F1021 Alcohol dependence, in remission: Secondary | ICD-10-CM

## 2011-06-30 DIAGNOSIS — F411 Generalized anxiety disorder: Secondary | ICD-10-CM

## 2011-06-30 MED ORDER — TRAZODONE HCL 50 MG PO TABS: ORAL_TABLET | ORAL | Status: DC

## 2011-06-30 MED ORDER — CLONAZEPAM 1 MG PO TABS: 0.5000 mg | ORAL_TABLET | Freq: Three times a day (TID) | ORAL | Status: DC | PRN

## 2011-06-30 MED ORDER — LAMOTRIGINE 25 MG PO TABS: ORAL_TABLET | ORAL | Status: DC

## 2011-06-30 MED ORDER — LAMOTRIGINE 150 MG PO TABS: ORAL_TABLET | ORAL | Status: DC

## 2011-06-30 MED ORDER — PAROXETINE HCL 40 MG PO TABS: ORAL_TABLET | ORAL | Status: DC
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
## 2011-07-05 ENCOUNTER — Ambulatory Visit (INDEPENDENT_AMBULATORY_CARE_PROVIDER_SITE_OTHER): Payer: BC Managed Care – PPO | Admitting: Licensed Clinical Social Worker

## 2011-07-05 ENCOUNTER — Ambulatory Visit: Payer: Self-pay | Admitting: Family Medicine

## 2011-07-05 DIAGNOSIS — Z0289 Encounter for other administrative examinations: Secondary | ICD-10-CM

## 2011-07-05 DIAGNOSIS — F329 Major depressive disorder, single episode, unspecified: Secondary | ICD-10-CM

## 2011-07-06 NOTE — Progress Notes (Signed)
   THERAPIST PROGRESS NOTE  Session Time: 2:00 - 2:50  Participation Level: Active  Behavioral Response: NeatAlertEuthymic  Type of Therapy: Individual Therapy  Treatment Goals addressed: Anxiety and Coping  Interventions: Motivational Interviewing and Supportive  Summary: Jennifer [name] is a 51 y.o. female who presents with depression due to medical problems.  [name] reported that she was quite sick last week - she had a lot of problems with chronic fatigue - dizziness, brain fog, weakness and pain.  Between her three conditions Lupus, Chronic fatigue and fibromyalgia - if one is starting - that triggers the other two to get worse.  They all seem to be interconnected. Her doctor is increasing her gabapentin and lowering her flexeril.  She does not feel her trazodone is working.  She is also lowering her klonopin.  Some of her difficulties are seasonal - she has more fatigue in the hot and muggy weather while in the winter it is not so bad.  She sees herself getting worse as far as her medical problems are concerned; and, this is worrying her. Perhaps talking more to the doctor about what symptoms means progression.  It is hard to have a chronic disease and you have to just do whatever you can and accept limitations which is not always easy.   Speaking of liljmitations - her children have trouble with her limitation.  They question whether she really has such serious problems and get angry when they interfere with time they want to spend with her.  They do not understand her disease process.  She does not always want to talk about it but they do need education.  She feels that [name] got short changed for she had to leave her with her father.  This was because her alcoholism was out of control and she was not caring for her appropriately  She cried for hours.  This is the daughter who is having the bridal shower.  So she does not want to disappoint her.  Her daughter Jennifer [name] has 3 kids and 2 step  children and she is pregnant - her comment was that she was not going to get attention after baby is born.    Suicidal/Homicidal: No  Plan: Return again in 1 weeks.  Diagnosis: Axis I: Depressive Disorder NOS    Axis II: Deferred    Jennifer [name],JUDITH A, LCSW 07/06/2011

## 2011-07-12 ENCOUNTER — Ambulatory Visit (INDEPENDENT_AMBULATORY_CARE_PROVIDER_SITE_OTHER): Payer: BC Managed Care – PPO | Admitting: Licensed Clinical Social Worker

## 2011-07-12 DIAGNOSIS — F32A Depression, unspecified: Secondary | ICD-10-CM

## 2011-07-12 DIAGNOSIS — F3289 Other specified depressive episodes: Secondary | ICD-10-CM

## 2011-07-12 DIAGNOSIS — F329 Major depressive disorder, single episode, unspecified: Secondary | ICD-10-CM

## 2011-07-12 NOTE — Progress Notes (Signed)
   THERAPIST PROGRESS NOTE  Session Time: 2:05 - 3:00  Participation Level: Active  Behavioral Response: Well GroomedAlertEuthymic  Type of Therapy: Individual Therapy  Treatment Goals addressed: Anxiety and Coping  Interventions: Motivational Interviewing and Supportive  Summary: Jennifer [name] is a 51 y.o. female who presents with problems with depression.  Ronnika reported she went to the mall with her husband and she had to ask him to use a wheelchair - it was hard at first but after awhile she did not care what people thought. She was able to get to all of the stores she wanted to get to. She is concerned that her Lupus is getting worse.  No one can give her a prognosis or specific feedback about that.  She is busy getting things done for trip to [place].for daughter's bridal shower.  She has some fears about letting her children know that she is not doing well.  They do not deal with the information well. . She may not be able to do all that she would like to do.  Her daughter asked if she would like to go to help her find a veil. She would love to and she hopes she is well enough.,Her children are not very accepting or understanding about her illness.  Suggested that she take on a demeanor of observation and refrain from reacting.  Write things down and we can discuss next time.  Suicidal/Homicidal: Nowithout intent/plan  Plan: Return again in 2 weeks.  Diagnosis: Axis I: Depressive Disorder NOS    Axis II: Deferred    Davita Sublett,JUDITH A, LCSW 07/12/2011

## 2011-07-26 ENCOUNTER — Telehealth (HOSPITAL_COMMUNITY): Payer: Self-pay | Admitting: Psychiatry

## 2011-07-26 DIAGNOSIS — F411 Generalized anxiety disorder: Secondary | ICD-10-CM

## 2011-07-26 MED ORDER — LAMOTRIGINE 150 MG PO TABS: ORAL_TABLET | ORAL | Status: DC

## 2011-07-26 MED ORDER — LAMOTRIGINE 25 MG PO TABS: ORAL_TABLET | ORAL | Status: DC

## 2011-07-26 NOTE — Telephone Encounter (Addendum)
Fax request by pharmacy for Lamotrigine.  PLAN: Will fill both Lamotrigine 150 mg  And 25 mg # 30 with 1 refill each.

## 2011-07-27 ENCOUNTER — Ambulatory Visit (HOSPITAL_COMMUNITY): Payer: Self-pay | Admitting: Licensed Clinical Social Worker

## 2011-08-03 ENCOUNTER — Telehealth (HOSPITAL_COMMUNITY): Payer: Self-pay

## 2011-08-03 NOTE — Telephone Encounter (Signed)
Acknowledged.

## 2011-08-04 ENCOUNTER — Encounter: Payer: Self-pay | Admitting: Family Medicine

## 2011-08-04 ENCOUNTER — Ambulatory Visit (INDEPENDENT_AMBULATORY_CARE_PROVIDER_SITE_OTHER): Payer: BC Managed Care – PPO | Admitting: Licensed Clinical Social Worker

## 2011-08-04 ENCOUNTER — Ambulatory Visit (INDEPENDENT_AMBULATORY_CARE_PROVIDER_SITE_OTHER): Payer: BC Managed Care – PPO | Admitting: Family Medicine

## 2011-08-04 VITALS — BP 133/84 | HR 79 | Ht 64.0 in | Wt 201.0 lb

## 2011-08-04 DIAGNOSIS — Z716 Tobacco abuse counseling: Secondary | ICD-10-CM

## 2011-08-04 DIAGNOSIS — F3289 Other specified depressive episodes: Secondary | ICD-10-CM

## 2011-08-04 DIAGNOSIS — F32A Depression, unspecified: Secondary | ICD-10-CM

## 2011-08-04 DIAGNOSIS — F329 Major depressive disorder, single episode, unspecified: Secondary | ICD-10-CM

## 2011-08-04 DIAGNOSIS — Z23 Encounter for immunization: Secondary | ICD-10-CM

## 2011-08-04 DIAGNOSIS — F17200 Nicotine dependence, unspecified, uncomplicated: Secondary | ICD-10-CM

## 2011-08-04 DIAGNOSIS — F332 Major depressive disorder, recurrent severe without psychotic features: Secondary | ICD-10-CM

## 2011-08-04 DIAGNOSIS — IMO0001 Reserved for inherently not codable concepts without codable children: Secondary | ICD-10-CM

## 2011-08-04 DIAGNOSIS — M797 Fibromyalgia: Secondary | ICD-10-CM

## 2011-08-04 NOTE — Progress Notes (Signed)
  Subjective:    Patient ID: Jennifer [name], female    DOB: August 21, 1960, 51 y.o.   MRN: [id number]  HPI Patient has been having increase pain fro fribromyalgia. She wants to have a referral to a pain clinic. She has a fibromyalgia/rheumatologist that she sees but the fibromyalgia is getting to the point where it stops her daily activities. She reports Neurontin that she is on does not seem to help much if any. She did not tolerate Cymbalta, or Lyrica.. She has been afraid of the opiates and she is allergic to oxycodone and hydrocodone as well. She states she has done her homework and is found a clinic in [place] that people who work with fibromyalgia she's going to take that chance. #2 depression. She reports psychiatrist has her multiple medications and seems to have the depression under much better control than when she was here the first time. One concern is that the psychiatrist is trying to wean her off her Klonopin by her Fibromyalgia Dr. is concerned about the increased pain. I am concerned about that and that without the Klonopin her smoking may increase since she uses that to reduce I think the anxiety of the fibromyalgia. #3 bronchospasm/smoking addiction/tobacco abuse. We talked about the need to really get her to stop her smoking. #4 immunization needs.. She needs pneumococcus vaccination. Review of Systems  Musculoskeletal: Positive for myalgias and arthralgias.  Psychiatric/Behavioral: The patient is nervous/anxious.   All other systems reviewed and are negative.  BP 133/84  Pulse 79  Ht 5\' 4"  (1.626 m)  Wt 201 lb (91.173 kg)  BMI 34.50 kg/m2  SpO2 93%    Objective:   Physical Exam  Constitutional: She is oriented to person, place, and time. She appears well-developed and well-nourished.  HENT:  Head: Normocephalic.  Neck: Normal range of motion.  Cardiovascular: Normal rate, regular rhythm and normal heart sounds.   Pulmonary/Chest: Effort normal and breath sounds normal.    Musculoskeletal: She exhibits tenderness.  Neurological: She is alert and oriented to person, place, and time. She has normal reflexes. No cranial nerve deficit.  Skin: Skin is warm and dry. No rash noted. No erythema.  Psychiatric: She has a normal mood and affect. Thought content normal.      Assessment & Plan:  #1 referral to pain clinic for fibromyalgia evaluation will be done once she notifies US of the clinic that she wants to go to. #2 depression. Obviously much better considering no police or IVC had to be contemplated today. She did recommend she talk to her psychiatrist about maybe maintaining her Klonopin. #3 smoking cessation. She did not tolerate Chantix but this week she purchased the smokeless cigarettes. For her history of bronchospasm this will be a better option but will still encourage her to eventually put down cigarettes completely. #4 immunization needs. Will minister pneumococcus vaccination.

## 2011-08-04 NOTE — Patient Instructions (Addendum)
Fibromyalgia Fibromyalgia is a disorder that is often misunderstood. It is associated with muscular pains and tenderness that comes and goes. It is often associated with fatigue and sleep disturbances. Though it tends to be long-lasting, fibromyalgia is not life-threatening. CAUSES  The exact cause of fibromyalgia is unknown. People with certain gene types arpredisposed e to developing fibromyalgia and other conditions. Certain factors can play a role as triggers, such as:  Spine disorders.   Arthritis.   Severe injury (trauma) and other physical stressors.   Emotional stressors.  SYMPTOMS   The main symptom is pain and stiffness in the muscles and joints, which can vary over time.   Sleep and fatigue problems.  Other related symptoms may include:  Bowel and bladder problems.   Headaches.   Visual problems.   Problems with odors and noises.   Depression or mood changes.   Painful periods (dysmenorrhea).   Dryness of the skin or eyes.  DIAGNOSIS  There are no specific tests for diagnosing fibromyalgia. Patients can be diagnosed accurately from the specific symptoms they have. The diagnosis is made by determining that nothing else is causing the problems. TREATMENT  There is no cure. Management includes medicines and an active, healthy lifestyle. The goal is to enhance physical fitness, decrease pain, and improve sleep. HOME CARE INSTRUCTIONS   Only take over-the-counter or prescription medicines as directed by your caregiver. Sleeping pills, tranquilizers, and pain medicines may make your problems worse.   Low-impact aerobic exercise is very important and advised for treatment. At first, it may seem to make pain worse. Gradually increasing your tolerance will overcome this feeling.   Learning relaxation techniques and how to control stress will help you. Biofeedback, visual imagery, hypnosis, muscle relaxation, yoga, and meditation are all options.   Anti-inflammatory  medicines and physical therapy may provide short-term help.   Acupuncture or massage treatments may help.   Take muscle relaxant medicines as suggested by your caregiver.   Avoid stressful situations.   Plan a healthy lifestyle. This includes your diet, sleep, rest, exercise, and friends.   Find and practice a hobby you enjoy.   Join a fibromyalgia support group for interaction, ideas, and sharing advice. This may be helpful.  SEEK MEDICAL CARE IF:  You are not having good results or improvement from your treatment. FOR MORE INFORMATION  National Fibromyalgia Association: www.fmaware.org Arthritis Foundation: www.arthritis.org Document Released: 12/20/2004 Document Revised: 12/09/2010 Document Reviewed: 04/01/2009 [facility] Patient Information 2012 [place], [place].Smoking Cessation, Tips for Success YOU CAN QUIT SMOKING If you are ready to quit smoking, congratulations! You have chosen to help yourself be healthier. Cigarettes bring nicotine, tar, carbon monoxide, and other irritants into your body. Your lungs, heart, and blood vessels will be able to work better without these poisons. There are many different ways to quit smoking. Nicotine gum, nicotine patches, a nicotine inhaler, or nicotine nasal spray can help with physical craving. Hypnosis, support groups, and medicines help break the habit of smoking. Here are some tips to help you quit for good.  Throw away all cigarettes.   Clean and remove all ashtrays from your home, work, and car.   On a card, write down your reasons for quitting. Carry the card with you and read it when you get the urge to smoke.   Cleanse your body of nicotine. Drink enough water and fluids to keep your urine clear or pale yellow. Do this after quitting to flush the nicotine from your body.   Learn to predict  your moods. Do not let a bad situation be your excuse to have a cigarette. Some situations in your life might tempt you into wanting a cigarette.     Never have "just one" cigarette. It leads to wanting another and another. Remind yourself of your decision to quit.   Change habits associated with smoking. If you smoked while driving or when feeling stressed, try other activities to replace smoking. Stand up when drinking your coffee. Brush your teeth after eating. Sit in a different chair when you read the paper. Avoid alcohol while trying to quit, and try to drink fewer caffeinated beverages. Alcohol and caffeine may urge you to smoke.   Avoid foods and drinks that can trigger a desire to smoke, such as sugary or spicy foods and alcohol.   Ask people who smoke not to smoke around you.   Have something planned to do right after eating or having a cup of coffee. Take a walk or exercise to perk you up. This will help to keep you from overeating.   Try a relaxation exercise to calm you down and decrease your stress. Remember, you may be tense and nervous for the first 2 weeks after you quit, but this will pass.   Find new activities to keep your hands busy. Play with a pen, coin, or rubber band. Doodle or draw things on paper.   Brush your teeth right after eating. This will help cut down on the craving for the taste of tobacco after meals. You can try mouthwash, too.   Use oral substitutes, such as lemon drops, carrots, a cinnamon stick, or chewing gum, in place of cigarettes. Keep them handy so they are available when you have the urge to smoke.   When you have the urge to smoke, try deep breathing.   Designate your home as a nonsmoking area.   If you are a heavy smoker, ask your caregiver about a prescription for nicotine chewing gum. It can ease your withdrawal from nicotine.   Reward yourself. Set aside the cigarette money you save and buy yourself something nice.   Look for support from others. Join a support group or smoking cessation program. Ask someone at home or at work to help you with your plan to quit smoking.   Always  ask yourself, "Do I need this cigarette or is this just a reflex?" Tell yourself, "Today, I choose not to smoke," or "I do not want to smoke." You are reminding yourself of your decision to quit, even if you do smoke a cigarette.  HOW WILL I FEEL WHEN I QUIT SMOKING?  The benefits of not smoking start within days of quitting.   You may have symptoms of withdrawal because your body is used to nicotine (the addictive substance in cigarettes). You may crave cigarettes, be irritable, feel very hungry, cough often, get headaches, or have difficulty concentrating.   The withdrawal symptoms are only temporary. They are strongest when you first quit but will go away within 10 to 14 days.   When withdrawal symptoms occur, stay in control. Think about your reasons for quitting. Remind yourself that these are signs that your body is healing and getting used to being without cigarettes.   Remember that withdrawal symptoms are easier to treat than the major diseases that smoking can cause.   Even after the withdrawal is over, expect periodic urges to smoke. However, these cravings are generally short-lived and will go away whether you smoke or not. Do  not smoke!   If you relapse and smoke again, do not lose hope. Most smokers quit 3 times before they are successful.   If you relapse, do not give up! Plan ahead and think about what you will do the next time you get the urge to smoke.  LIFE AS A NONSMOKER: MAKE IT FOR A MONTH, MAKE IT FOR LIFE Day 1: Hang this page where you will see it every day. Day 2: Get rid of all ashtrays, matches, and lighters. Day 3: Drink water. Breathe deeply between sips. Day 4: Avoid places with smoke-filled air, such as bars, clubs, or the smoking section of restaurants. Day 5: Keep track of how much money you save by not smoking. Day 6: Avoid boredom. Keep a good book with you or go to the movies. Day 7: Reward yourself! One week without smoking! Day 8: Make a dental  appointment to get your teeth cleaned. Day 9: Decide how you will turn down a cigarette before it is offered to you. Day 10: Review your reasons for quitting. Day 11: Distract yourself. Stay active to keep your mind off smoking and to relieve tension. Take a walk, exercise, read a book, do a crossword puzzle, or try a new hobby. Day 12: Exercise. Get off the bus before your stop or use stairs instead of escalators. Day 13: Call on friends for support and encouragement. Day 14: Reward yourself! Two weeks without smoking! Day 15: Practice deep breathing exercises. Day 16: Bet a friend that you can stay a nonsmoker. Day 17: Ask to sit in nonsmoking sections of restaurants. Day 18: Hang up "No Smoking" signs. Day 19: Think of yourself as a nonsmoker. Day 20: Each morning, tell yourself you will not smoke. Day 21: Reward yourself! Three weeks without smoking! Day 22: Think of smoking in negative ways. Remember how it stains your teeth, gives you bad breath, and leaves you short of breath. Day 23: Eat a nutritious breakfast. Day 24:Do not relive your days as a smoker. Day 25: Hold a pencil in your hand when talking on the telephone. Day 26: Tell all your friends you do not smoke. Day 27: Think about how much better food tastes. Day 28: Remember, one cigarette is one too many. Day 29: Take up a hobby that will keep your hands busy. Day 30: Congratulations! One month without smoking! Give yourself a big reward. Your caregiver can direct you to community resources or hospitals for support, which may include:  Group support.   Education.   Hypnosis.   Subliminal therapy.  Document Released: 09/18/2003 Document Revised: 12/09/2010 Document Reviewed: 10/06/2008 [facility] Patient Information 2012 [place], [place].Smoking Cessation This document explains the best ways for you to quit smoking and new treatments to help. It lists new medicines that can double or triple your chances of quitting and  quitting for good. It also considers ways to avoid relapses and concerns you may have about quitting, including weight gain. NICOTINE: A POWERFUL ADDICTION If you have tried to quit smoking, you know how hard it can be. It is hard because nicotine is a very addictive drug. For some people, it can be as addictive as heroin or cocaine. Usually, people make 2 or 3 tries, or more, before finally being able to quit. Each time you try to quit, you can learn about what helps and what hurts. Quitting takes hard work and a lot of effort, but you can quit smoking. QUITTING SMOKING IS ONE OF THE MOST IMPORTANT THINGS  YOU WILL EVER DO.  You will live longer, feel better, and live better.   The impact on your body of quitting smoking is felt almost immediately:   Within 20 minutes, blood pressure decreases. Pulse returns to its normal level.   After 8 hours, carbon monoxide levels in the blood return to normal. Oxygen level increases.   After 24 hours, chance of heart attack starts to decrease. Breath, hair, and body stop smelling like smoke.   After 48 hours, damaged nerve endings begin to recover. Sense of taste and smell improve.   After 72 hours, the body is virtually free of nicotine. Bronchial tubes relax and breathing becomes easier.   After 2 to 12 weeks, lungs can hold more air. Exercise becomes easier and circulation improves.   Quitting will reduce your risk of having a heart attack, stroke, cancer, or lung disease:   After 1 year, the risk of coronary heart disease is cut in half.   After 5 years, the risk of stroke falls to the same as a nonsmoker.   After 10 years, the risk of lung cancer is cut in half and the risk of other cancers decreases significantly.   After 15 years, the risk of coronary heart disease drops, usually to the level of a nonsmoker.   If you are pregnant, quitting smoking will improve your chances of having a healthy baby.   The people you live with, especially  your children, will be healthier.   You will have extra money to spend on things other than cigarettes.  FIVE KEYS TO QUITTING Studies have shown that these 5 steps will help you quit smoking and quit for good. You have the best chances of quitting if you use them together: 1. Get ready.  2. Get support and encouragement.  3. Learn new skills and behaviors.  4. Get medicine to reduce your nicotine addiction and use it correctly.  5. Be prepared for relapse or difficult situations. Be determined to continue trying to quit, even if you do not succeed at first.  1. GET READY  Set a quit date.   Change your environment.   Get rid of ALL cigarettes, ashtrays, matches, and lighters in your home, car, and place of work.   Do not let people smoke in your home.   Review your past attempts to quit. Think about what worked and what did not.   Once you quit, do not smoke. NOT EVEN A PUFF!  2. GET SUPPORT AND ENCOURAGEMENT Studies have shown that you have a better chance of being successful if you have help. You can get support in many ways.  Tell your family, friends, and coworkers that you are going to quit and need their support. Ask them not to smoke around you.   Talk to your caregivers (doctor, dentist, nurse, pharmacist, psychologist, and/or smoking counselor).   Get individual, group, or telephone counseling and support. The more counseling you have, the better your chances are of quitting. Programs are available at [employer] and health centers. Call your local health department for information about programs in your area.   Spiritual beliefs and practices may help some smokers quit.   Quit meters are Photographer that keep track of quit statistics, such as amount of "quit-time," cigarettes not smoked, and money saved.   Many smokers find one or more of the many self-help books available useful in helping them quit and stay off tobacco.  3.  LEARN  NEW SKILLS AND BEHAVIORS  Try to distract yourself from urges to smoke. Talk to someone, go for a walk, or occupy your time with a task.   When you first try to quit, change your routine. Take a different route to work. Drink tea instead of coffee. Eat breakfast in a different place.   Do something to reduce your stress. Take a hot bath, exercise, or read a book.   Plan something enjoyable to do every day. Reward yourself for not smoking.   Explore interactive web-based programs that specialize in helping you quit.  4. GET MEDICINE AND USE IT CORRECTLY Medicines can help you stop smoking and decrease the urge to smoke. Combining medicine with the above behavioral methods and support can quadruple your chances of successfully quitting smoking. The U.S. Food and Drug Administration (FDA) has approved 7 medicines to help you quit smoking. These medicines fall into 3 categories.  Nicotine replacement therapy (delivers nicotine to your body without the negative effects and risks of smoking):   Nicotine gum: Available over-the-counter.   Nicotine lozenges: Available over-the-counter.   Nicotine inhaler: Available by prescription.   Nicotine nasal spray: Available by prescription.   Nicotine skin patches (transdermal): Available by prescription and over-the-counter.   Antidepressant medicine (helps people abstain from smoking, but how this works is unknown):   Bupropion sustained-release (SR) tablets: Available by prescription.   Nicotinic receptor partial agonist (simulates the effect of nicotine in your brain):   Varenicline tartrate tablets: Available by prescription.   Ask your caregiver for advice about which medicines to use and how to use them. Carefully read the information on the package.   Everyone who is trying to quit may benefit from using a medicine. If you are pregnant or trying to become pregnant, nursing an infant, you are under age 63, or you smoke fewer than 10  cigarettes per day, talk to your caregiver before taking any nicotine replacement medicines.   You should stop using a nicotine replacement product and call your caregiver if you experience nausea, dizziness, weakness, vomiting, fast or irregular heartbeat, mouth problems with the lozenge or gum, or redness or swelling of the skin around the patch that does not go away.   Do not use any other product containing nicotine while using a nicotine replacement product.   Talk to your caregiver before using these products if you have diabetes, heart disease, asthma, stomach ulcers, you had a recent heart attack, you have high blood pressure that is not controlled with medicine, a history of irregular heartbeat, or you have been prescribed medicine to help you quit smoking.  5. BE PREPARED FOR RELAPSE OR DIFFICULT SITUATIONS  Most relapses occur within the first 3 months after quitting. Do not be discouraged if you start smoking again. Remember, most people try several times before they finally quit.   You may have symptoms of withdrawal because your body is used to nicotine. You may crave cigarettes, be irritable, feel very hungry, cough often, get headaches, or have difficulty concentrating.   The withdrawal symptoms are only temporary. They are strongest when you first quit, but they will go away within 10 to 14 days.  Here are some difficult situations to watch for:  Alcohol. Avoid drinking alcohol. Drinking lowers your chances of successfully quitting.   Caffeine. Try to reduce the amount of caffeine you consume. It also lowers your chances of successfully quitting.   Other smokers. Being around smoking can make you want to smoke. Avoid smokers.  Weight gain. Many smokers will gain weight when they quit, usually less than 10 pounds. Eat a healthy diet and stay active. Do not let weight gain distract you from your main goal, quitting smoking. Some medicines that help you quit smoking may also help  delay weight gain. You can always lose the weight gained after you quit.   Bad mood or depression. There are a lot of ways to improve your mood other than smoking.  If you are having problems with any of these situations, talk to your caregiver. SPECIAL SITUATIONS AND CONDITIONS Studies suggest that everyone can quit smoking. Your situation or condition can give you a special reason to quit.  Pregnant women/new mothers: By quitting, you protect your baby's health and your own.   Hospitalized patients: By quitting, you reduce health problems and help healing.   Heart attack patients: By quitting, you reduce your risk of a second heart attack.   Lung, head, and neck cancer patients: By quitting, you reduce your chance of a second cancer.   Parents of children and adolescents: By quitting, you protect your children from illnesses caused by secondhand smoke.  QUESTIONS TO THINK ABOUT Think about the following questions before you try to stop smoking. You may want to talk about your answers with your caregiver.  Why do you want to quit?   If you tried to quit in the past, what helped and what did not?   What will be the most difficult situations for you after you quit? How will you plan to handle them?   Who can help you through the tough times? Your family? Friends? Caregiver?   What pleasures do you get from smoking? What ways can you still get pleasure if you quit?  Here are some questions to ask your caregiver:  How can you help me to be successful at quitting?   What medicine do you think would be best for me and how should I take it?   What should I do if I need more help?   What is smoking withdrawal like? How can I get information on withdrawal?  Quitting takes hard work and a lot of effort, but you can quit smoking. FOR MORE INFORMATION  Smokefree.gov (http://www.davis-sullivan.com/) provides free, accurate, evidence-based information and professional assistance to help support  the immediate and long-term needs of people trying to quit smoking. Document Released: 12/14/2000 Document Revised: 12/09/2010 Document Reviewed: 10/06/2008 [facility] Patient Information 2012 [place], [place].

## 2011-08-05 ENCOUNTER — Other Ambulatory Visit: Payer: Self-pay | Admitting: Family Medicine

## 2011-08-05 NOTE — Progress Notes (Signed)
   THERAPIST PROGRESS NOTE  Session Time: 4:00 - 4:55  Participation Level: Active  Behavioral Response: Well GroomedAlertEuthymic  Type of Therapy: Individual Therapy  Treatment Goals addressed: Communication: with son  Interventions: Motivational Interviewing and Supportive  Summary: Jennifer [name] is a 51 y.o. female who presents with depression associated with pain.  [name] reports that she is having a pretty good day and was glad that she could come in today. She showed counselor an e-mail from her son.  It started with a picture being sent to her of her grandson.  She made a comment and he communicated further with her. It was pleasant until she asked if he was going to be at [facility] wedding for awhile that maybe they could talk about their issues.  He reacted to that and was annoyed that she brought anything up.  York Spaniel it was not the time or the place and she needed to do what he asked and let it lay.  When she asked if she could see her grandchild, he indicated that he never said she could not . The reality is that they never return her calls or requests to see him so it makes sense to assume no contact is what is being conveyed.  Discussed how she needs to assume there are no blocks to seeing him.  He also said he "cherished" that she was in touch with him so much but wanted to let things be for he did not want any drama - she does not make the drama - it seems like he does.  He is still in [place] but will be home for the wedding.  She has not responded to the tirade.    She is feeling well enough to go to a meeting today and she is pleased about that.  She has been taking to her husband about the possibility of going to a pain clinic - she has been against them because they seem to just trow drugs at you and she does not want to get addicted.  She found a clinic that does other things to help you with pain so she is going to give them a try.   Suicidal/Homicidal: Nowithout  intent/plan  Plan: Return again in 2 weeks.  Diagnosis: Axis I: Major Depression, Recurrent severe    Axis II: Deferred    [name],JUDITH A, LCSW 08/05/2011

## 2011-08-10 ENCOUNTER — Other Ambulatory Visit: Payer: Self-pay | Admitting: *Deleted

## 2011-08-10 MED ORDER — CYCLOBENZAPRINE HCL 10 MG PO TABS: 10.0000 mg | ORAL_TABLET | Freq: Every evening | ORAL | Status: DC | PRN

## 2011-08-10 NOTE — Telephone Encounter (Signed)
Pt of Dr. Warden Fillers. Calling requesting a refill on Flexeril stating her muscles are hurting. Last refill was over a year ago. Please advise.

## 2011-08-10 NOTE — Telephone Encounter (Signed)
Seen by Dr. Thurmond Butts 08/04/11. Will give flexeril to use at bedtime for muscle spasm.

## 2011-08-10 NOTE — Telephone Encounter (Signed)
Pt informed

## 2011-08-18 ENCOUNTER — Ambulatory Visit (INDEPENDENT_AMBULATORY_CARE_PROVIDER_SITE_OTHER): Payer: BC Managed Care – PPO | Admitting: Licensed Clinical Social Worker

## 2011-08-18 DIAGNOSIS — F3289 Other specified depressive episodes: Secondary | ICD-10-CM

## 2011-08-18 DIAGNOSIS — F329 Major depressive disorder, single episode, unspecified: Secondary | ICD-10-CM

## 2011-08-22 ENCOUNTER — Other Ambulatory Visit: Payer: Self-pay | Admitting: Physician Assistant

## 2011-08-22 NOTE — Progress Notes (Signed)
   THERAPIST PROGRESS NOTE  Session Time: 2:05 - 2:55  Participation Level: Active  Behavioral Response: NeatAlertEuthymic  Type of Therapy: Individual Therapy  Treatment Goals addressed: Anxiety and Coping  Interventions: Motivational Interviewing and Supportive  Summary: Jennifer [name] is a 51 y.o. female who presents with issues with adult children.  Jennifer [name] reports that she is going to a pain management clinic = it supposedly is a program that is saying that they use a lot of techniques not just medications.  She is going to try the grup. She does not want to get addicted to pain pills. She is very upset at how much pain that she has at times where walking and balance is difficult - she finds she even will shuffle which bothers her.  She talked about the shower and how that was for her.  Overall she had a good time but she had to deal with a lot of fatigue  Her kids do not understand her illness even though she has sent them some information.  .She is worried about her husband because he has had problems with leakage of blood from his rectum.  He is finally going to see a doctor.  She has been able to arrange to see her grandson the first weekend in September.  She has been allowed to see him a few hours for two days.  She is glad to have anytime at all.  Her son's wife finally answered her text.   She is looking forward to the wedding which is in October - she feels that she is doing a lot better and so she plans to come back after the wedding.  She will call if something comes up before.    Suicidal/Homicidal: Nowithout intent/plan  Plan: Return again in 4 weeks.  Diagnosis: Axis I: Depressive Disorder NOS    Axis II: Deferred    Jennifer [name],JUDITH A, LCSW 08/22/2011

## 2011-08-26 NOTE — Telephone Encounter (Signed)
This was handled.

## 2011-09-06 ENCOUNTER — Other Ambulatory Visit: Payer: Self-pay | Admitting: *Deleted

## 2011-09-06 MED ORDER — OXYBUTYNIN CHLORIDE ER 5 MG PO TB24: 5.0000 mg | ORAL_TABLET | Freq: Every day | ORAL | Status: DC

## 2011-09-20 ENCOUNTER — Telehealth (HOSPITAL_COMMUNITY): Payer: Self-pay

## 2011-09-20 NOTE — Telephone Encounter (Signed)
PT SAID SHE IS NOT FEELING WELL AGAIN. DID MAKE APPT FOR NEXT AVAILABLE WHICH IS 9/24 PT WOULD LIKE TO SPEAK TO YOU BEFORE THEN

## 2011-09-22 ENCOUNTER — Telehealth (HOSPITAL_COMMUNITY): Payer: Self-pay | Admitting: Licensed Clinical Social Worker

## 2011-09-27 ENCOUNTER — Ambulatory Visit (INDEPENDENT_AMBULATORY_CARE_PROVIDER_SITE_OTHER): Payer: BC Managed Care – PPO | Admitting: Licensed Clinical Social Worker

## 2011-09-27 DIAGNOSIS — F332 Major depressive disorder, recurrent severe without psychotic features: Secondary | ICD-10-CM
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
## 2011-10-11 ENCOUNTER — Other Ambulatory Visit (HOSPITAL_COMMUNITY): Payer: Self-pay | Admitting: Psychiatry

## 2011-10-11 DIAGNOSIS — F411 Generalized anxiety disorder: Secondary | ICD-10-CM

## 2011-10-11 MED ORDER — LAMOTRIGINE 150 MG PO TABS: ORAL_TABLET | ORAL | Status: DC

## 2011-10-11 MED ORDER — LAMOTRIGINE 25 MG PO TABS: ORAL_TABLET | ORAL | Status: DC

## 2011-10-19 ENCOUNTER — Ambulatory Visit (INDEPENDENT_AMBULATORY_CARE_PROVIDER_SITE_OTHER): Payer: BC Managed Care – PPO | Admitting: Licensed Clinical Social Worker

## 2011-10-19 DIAGNOSIS — F332 Major depressive disorder, recurrent severe without psychotic features: Secondary | ICD-10-CM
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
## 2011-10-25 ENCOUNTER — Ambulatory Visit (HOSPITAL_COMMUNITY): Payer: Self-pay | Admitting: Licensed Clinical Social Worker

## 2011-10-27 ENCOUNTER — Ambulatory Visit: Payer: Self-pay | Admitting: Critical Care Medicine

## 2011-11-29 ENCOUNTER — Ambulatory Visit (HOSPITAL_COMMUNITY): Payer: Self-pay | Admitting: Licensed Clinical Social Worker

## 2011-12-06 ENCOUNTER — Other Ambulatory Visit (HOSPITAL_COMMUNITY): Payer: Self-pay | Admitting: Psychiatry

## 2011-12-06 ENCOUNTER — Ambulatory Visit (INDEPENDENT_AMBULATORY_CARE_PROVIDER_SITE_OTHER): Payer: BC Managed Care – PPO | Admitting: Licensed Clinical Social Worker

## 2011-12-06 DIAGNOSIS — F332 Major depressive disorder, recurrent severe without psychotic features: Secondary | ICD-10-CM
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
## 2011-12-07 ENCOUNTER — Other Ambulatory Visit (HOSPITAL_COMMUNITY): Payer: Self-pay | Admitting: Psychiatry

## 2011-12-08 ENCOUNTER — Other Ambulatory Visit (HOSPITAL_COMMUNITY): Payer: Self-pay | Admitting: Psychiatry

## 2012-01-17 ENCOUNTER — Other Ambulatory Visit: Payer: Self-pay | Admitting: Family Medicine

## 2012-01-25 ENCOUNTER — Ambulatory Visit (INDEPENDENT_AMBULATORY_CARE_PROVIDER_SITE_OTHER): Payer: BC Managed Care – PPO | Admitting: Sports Medicine

## 2012-01-25 ENCOUNTER — Encounter: Payer: Self-pay | Admitting: Sports Medicine

## 2012-01-25 VITALS — BP 136/100 | HR 96 | Wt 198.0 lb

## 2012-01-25 DIAGNOSIS — M5136 Other intervertebral disc degeneration, lumbar region: Secondary | ICD-10-CM | POA: Insufficient documentation

## 2012-01-25 DIAGNOSIS — M51369 Other intervertebral disc degeneration, lumbar region without mention of lumbar back pain or lower extremity pain: Secondary | ICD-10-CM | POA: Insufficient documentation

## 2012-01-25 DIAGNOSIS — M545 Low back pain, unspecified: Secondary | ICD-10-CM

## 2012-01-25 DIAGNOSIS — M5416 Radiculopathy, lumbar region: Secondary | ICD-10-CM | POA: Insufficient documentation

## 2012-01-25 DIAGNOSIS — Z23 Encounter for immunization: Secondary | ICD-10-CM

## 2012-01-25 MED ORDER — PREDNISONE 50 MG PO TABS: ORAL_TABLET | ORAL | Status: DC

## 2012-01-25 NOTE — Assessment & Plan Note (Signed)
Low-back pain is predominantly on the left side with some symptoms suggestive of lumbar radiculitis but more symptoms suggestive of sacroiliac joint dysfunction. She is already on gabapentin and cyclobenzaprine long acting as well as new symptom. I think these are excellent medications. I would also like to add 5 days of prednisone and formal physical therapy predominately for the lumbar spine and sacroiliac joint. I would like to do this for 4 weeks and if no better we could certainly pull the trigger for an MRI of the lumbar spine. She does have an injection scheduled with her physiatrist, I think this will

## 2012-01-25 NOTE — Progress Notes (Signed)
SPORTS MEDICINE CONSULTATION REPORT  Subjective:    CC: Back pain  HPI: Jennifer [name] is a very pleasant 52 year old female who comes in with a long history of chronic fatigue and low back pain. She localizes her back pain in both SI joints, left worse than right. She notes some radiation down into the buttock, and left leg but predominately localized. Symptoms are moderate. They're not worse with any particular movement with the exception of prolonged standing. She denies any bowel or bladder dysfunction.  She has never had formal physical therapy were advanced imaging, and currently sees a pain management physician who is treating her with long-acting gabapentin and cyclobenzaprine as well as NuCynta.  She is currently on the gabapentin up taper and is only at 300 mg daily now.  Past medical history, Surgical history, Family history not pertinant except as noted below, Social history, Allergies, and medications have been entered into the medical record, reviewed, and no changes needed.   Review of Systems: No headache, visual changes, nausea, vomiting, diarrhea, constipation, dizziness, abdominal pain, skin rash, fevers, chills, night sweats, weight loss, swollen lymph nodes, body aches, joint swelling, muscle aches, chest pain, shortness of breath, mood changes, visual or auditory hallucinations.   Objective:   General: Well Developed, well nourished, and in no acute distress.  Neuro/Psych: Alert and oriented x3, extra-ocular muscles intact, able to move all 4 extremities, sensation grossly intact. Skin: Warm and dry, no rashes noted.  Respiratory: Not using accessory muscles, speaking in full sentences, trachea midline.  Cardiovascular: Pulses palpable, no extremity edema. Abdomen: Does not appear distended. Back Exam:  Inspection: Unremarkable  Motion: Flexion 45 deg, Extension 45 deg, Side Bending to 45 deg bilaterally,  Rotation to 45 deg bilaterally  SLR laying: Negative  XSLR laying:  Negative  Palpable tenderness: Left and right sacroiliac joints. FABER: negative. Sensory change: Gross sensation intact to all lumbar and sacral dermatomes.  Reflexes: 2+ at both patellar tendons, 2+ at achilles tendons, Babinski's downgoing.  Strength at foot  Plantar-flexion: 5/5 Dorsi-flexion: 5/5 Eversion: 5/5 Inversion: 5/5  Leg strength  Quad: 5/5 Hamstring: 5/5 Hip flexor: 5/5 Hip abductors: 5/5  Gait unremarkable.  Impression and Recommendations:   This case required medical decision making of moderate complexity.

## 2012-01-27 ENCOUNTER — Other Ambulatory Visit: Payer: Self-pay | Admitting: *Deleted

## 2012-01-27 DIAGNOSIS — N3281 Overactive bladder: Secondary | ICD-10-CM

## 2012-01-27 MED ORDER — OXYBUTYNIN CHLORIDE ER 5 MG PO TB24: 5.0000 mg | ORAL_TABLET | Freq: Every day | ORAL | Status: DC

## 2012-02-01 ENCOUNTER — Telehealth: Payer: Self-pay | Admitting: *Deleted

## 2012-02-01 NOTE — Telephone Encounter (Signed)
Pt states she has checked her BP at home at that it is 131/93. She is asking if you want her to see you or if you want her to keep an eye on it a little longer and see if it gets any better. Please advise.

## 2012-02-01 NOTE — Telephone Encounter (Signed)
Pt informed

## 2012-02-01 NOTE — Telephone Encounter (Signed)
Naw, we will just recheck when she comes back, cut out any salt in diet for now.

## 2012-02-02 ENCOUNTER — Ambulatory Visit: Payer: BC Managed Care – PPO | Admitting: Physical Therapy

## 2012-02-03 ENCOUNTER — Ambulatory Visit (INDEPENDENT_AMBULATORY_CARE_PROVIDER_SITE_OTHER): Payer: BC Managed Care – PPO | Admitting: Psychiatry

## 2012-02-03 ENCOUNTER — Encounter (HOSPITAL_COMMUNITY): Payer: Self-pay | Admitting: Psychiatry

## 2012-02-03 VITALS — BP 118/81 | HR 90 | Ht 64.0 in | Wt 203.0 lb

## 2012-02-03 DIAGNOSIS — F332 Major depressive disorder, recurrent severe without psychotic features: Secondary | ICD-10-CM

## 2012-02-03 DIAGNOSIS — F411 Generalized anxiety disorder: Secondary | ICD-10-CM

## 2012-02-03 DIAGNOSIS — F1021 Alcohol dependence, in remission: Secondary | ICD-10-CM

## 2012-02-03 DIAGNOSIS — G4700 Insomnia, unspecified: Secondary | ICD-10-CM

## 2012-02-03 MED ORDER — TRAZODONE HCL 50 MG PO TABS: 50.0000 mg | ORAL_TABLET | Freq: Every day | ORAL | Status: DC

## 2012-02-03 MED ORDER — PAROXETINE HCL 40 MG PO TABS: ORAL_TABLET | ORAL | Status: DC

## 2012-02-03 MED ORDER — LAMOTRIGINE 25 & 50 & 100 MG PO KIT: 1.0000 | PACK | Freq: Every day | ORAL | Status: DC
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
## 2012-02-05 ENCOUNTER — Encounter (HOSPITAL_COMMUNITY): Payer: Self-pay | Admitting: Psychiatry

## 2012-02-05 NOTE — Addendum Note (Signed)
Addended by: Larena Sox on: 02/05/2012 11:40 PM   Modules accepted: Level of Service

## 2012-02-06 ENCOUNTER — Ambulatory Visit (INDEPENDENT_AMBULATORY_CARE_PROVIDER_SITE_OTHER): Payer: BC Managed Care – PPO | Admitting: Sports Medicine

## 2012-02-06 ENCOUNTER — Encounter: Payer: Self-pay | Admitting: Sports Medicine

## 2012-02-06 VITALS — BP 126/82 | HR 99 | Wt 199.0 lb

## 2012-02-06 DIAGNOSIS — IMO0001 Reserved for inherently not codable concepts without codable children: Secondary | ICD-10-CM

## 2012-02-06 DIAGNOSIS — Z0000 Encounter for general adult medical examination without abnormal findings: Secondary | ICD-10-CM | POA: Insufficient documentation

## 2012-02-06 DIAGNOSIS — Z299 Encounter for prophylactic measures, unspecified: Secondary | ICD-10-CM

## 2012-02-06 DIAGNOSIS — M545 Low back pain, unspecified: Secondary | ICD-10-CM

## 2012-02-06 DIAGNOSIS — E785 Hyperlipidemia, unspecified: Secondary | ICD-10-CM

## 2012-02-06 DIAGNOSIS — R030 Elevated blood-pressure reading, without diagnosis of hypertension: Secondary | ICD-10-CM

## 2012-02-06 DIAGNOSIS — E079 Disorder of thyroid, unspecified: Secondary | ICD-10-CM

## 2012-02-06 DIAGNOSIS — N329 Bladder disorder, unspecified: Secondary | ICD-10-CM

## 2012-02-06 NOTE — Progress Notes (Signed)
Subjective:    CC: Followup  HPI: Elevated blood pressure: Some of her home readings have ranged as high as 140/90, but most have been between 120-130/80-90.  Hypothyroidism: Due for recheck of TSH.  Low back pain: Still currently undergoing conservative measures.  Past medical history, Surgical history, Family history not pertinant except as noted below, Social history, Allergies, and medications have been entered into the medical record, reviewed, and no changes needed.   Review of Systems: No fevers, chills, night sweats, weight loss, chest pain, or shortness of breath.   Objective:    General: Well Developed, well nourished, and in no acute distress.  Neuro: Alert and oriented x3, extra-ocular muscles intact, sensation grossly intact.  HEENT: Normocephalic, atraumatic, pupils equal round reactive to light, neck supple, no masses, no lymphadenopathy, thyroid nonpalpable.  Skin: Warm and dry, no rashes. Cardiac: Regular rate and rhythm, no murmurs rubs or gallops.  Respiratory: Clear to auscultation bilaterally. Not using accessory muscles, speaking in full sentences.  Impression and Recommendations:

## 2012-02-06 NOTE — Assessment & Plan Note (Signed)
Checking TSH.

## 2012-02-06 NOTE — Assessment & Plan Note (Signed)
We will revisit this at the end of the month.

## 2012-02-06 NOTE — Assessment & Plan Note (Signed)
Checking some routine blood work. 

## 2012-02-07 DIAGNOSIS — E785 Hyperlipidemia, unspecified: Secondary | ICD-10-CM | POA: Insufficient documentation

## 2012-02-07 LAB — HEMOGLOBIN A1C
Hgb A1c MFr Bld: 5.5 % (ref <5.7)
Mean Plasma Glucose: 111 mg/dL (ref <117)

## 2012-02-07 LAB — LDL CHOLESTEROL, DIRECT: Direct LDL: 183 mg/dL — ABNORMAL HIGH

## 2012-02-07 LAB — CBC
HCT: 43.1 % (ref 36.0–46.0)
Hemoglobin: 14.9 g/dL (ref 12.0–15.0)
MCH: 32 pg (ref 26.0–34.0)
MCHC: 34.6 g/dL (ref 30.0–36.0)
MCV: 92.5 fL (ref 78.0–100.0)
Platelets: 190 K/uL (ref 150–400)
RBC: 4.66 MIL/uL (ref 3.87–5.11)
RDW: 13.9 % (ref 11.5–15.5)
WBC: 5.5 K/uL (ref 4.0–10.5)

## 2012-02-07 LAB — COMPREHENSIVE METABOLIC PANEL WITH GFR
ALT: 58 U/L — ABNORMAL HIGH (ref 0–35)
Albumin: 4.6 g/dL (ref 3.5–5.2)
CO2: 30 meq/L (ref 19–32)
Calcium: 9.7 mg/dL (ref 8.4–10.5)
Chloride: 101 meq/L (ref 96–112)
Potassium: 4.4 meq/L (ref 3.5–5.3)
Sodium: 139 meq/L (ref 135–145)
Total Bilirubin: 0.5 mg/dL (ref 0.3–1.2)
Total Protein: 7 g/dL (ref 6.0–8.3)

## 2012-02-07 LAB — COMPREHENSIVE METABOLIC PANEL
AST: 40 U/L — ABNORMAL HIGH (ref 0–37)
Alkaline Phosphatase: 48 U/L (ref 39–117)
BUN: 12 mg/dL (ref 6–23)
Creat: 1.02 mg/dL (ref 0.50–1.10)
Glucose, Bld: 90 mg/dL (ref 70–99)

## 2012-02-07 LAB — TSH: TSH: 2.243 u[IU]/mL (ref 0.350–4.500)

## 2012-02-07 LAB — VITAMIN D 25 HYDROXY (VIT D DEFICIENCY, FRACTURES): Vit D, 25-Hydroxy: 27 ng/mL — ABNORMAL LOW (ref 30–89)

## 2012-02-07 MED ORDER — ATORVASTATIN CALCIUM 40 MG PO TABS: 40.0000 mg | ORAL_TABLET | Freq: Every day | ORAL | Status: DC

## 2012-02-07 MED ORDER — VITAMIN D (ERGOCALCIFEROL) 1.25 MG (50000 UNIT) PO CAPS: 50000.0000 [IU] | ORAL_CAPSULE | ORAL | Status: DC

## 2012-02-07 NOTE — Assessment & Plan Note (Signed)
Adding atorvastatin, 40 mg. Recheck in 3 months.

## 2012-02-07 NOTE — Addendum Note (Signed)
Addended by: Monica Becton on: 02/07/2012 12:00 PM   Modules accepted: Orders

## 2012-02-07 NOTE — Addendum Note (Signed)
Addended by: Monica Becton on: 02/07/2012 09:39 AM   Modules accepted: Orders

## 2012-02-20 ENCOUNTER — Other Ambulatory Visit (HOSPITAL_COMMUNITY): Payer: Self-pay | Admitting: Psychiatry

## 2012-02-20 ENCOUNTER — Other Ambulatory Visit: Payer: Self-pay | Admitting: Family Medicine

## 2012-02-27 ENCOUNTER — Ambulatory Visit (INDEPENDENT_AMBULATORY_CARE_PROVIDER_SITE_OTHER): Payer: BC Managed Care – PPO | Admitting: Psychiatry

## 2012-02-27 ENCOUNTER — Encounter (HOSPITAL_COMMUNITY): Payer: Self-pay | Admitting: Psychiatry

## 2012-02-27 VITALS — BP 121/84 | HR 85 | Ht 64.0 in | Wt 199.0 lb

## 2012-02-27 DIAGNOSIS — F332 Major depressive disorder, recurrent severe without psychotic features: Secondary | ICD-10-CM

## 2012-02-27 DIAGNOSIS — G4700 Insomnia, unspecified: Secondary | ICD-10-CM

## 2012-02-27 MED ORDER — LAMOTRIGINE 100 MG PO TABS: 100.0000 mg | ORAL_TABLET | Freq: Every day | ORAL | Status: DC
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
## 2012-02-29 MED ORDER — TRAZODONE HCL 50 MG PO TABS: ORAL_TABLET | ORAL | Status: DC

## 2012-03-21 ENCOUNTER — Other Ambulatory Visit: Payer: Self-pay | Admitting: Sports Medicine

## 2012-04-02 ENCOUNTER — Ambulatory Visit (HOSPITAL_COMMUNITY): Payer: Self-pay | Admitting: Psychiatry

## 2012-04-02 ENCOUNTER — Other Ambulatory Visit (HOSPITAL_COMMUNITY): Payer: Self-pay | Admitting: Psychiatry

## 2012-04-02 DIAGNOSIS — G4700 Insomnia, unspecified: Secondary | ICD-10-CM

## 2012-04-02 MED ORDER — TRAZODONE HCL 50 MG PO TABS: ORAL_TABLET | ORAL | Status: DC

## 2012-04-08 ENCOUNTER — Other Ambulatory Visit: Payer: Self-pay | Admitting: Sports Medicine

## 2012-04-18 ENCOUNTER — Encounter: Payer: Self-pay | Admitting: Sports Medicine

## 2012-04-18 NOTE — Progress Notes (Signed)
I reviewed medical records from [facility], there are several office visits, but there are no mention of imaging studies, electrodiagnostic study results, or injections.

## 2012-04-30 ENCOUNTER — Encounter: Payer: Self-pay | Admitting: Sports Medicine

## 2012-04-30 ENCOUNTER — Ambulatory Visit (INDEPENDENT_AMBULATORY_CARE_PROVIDER_SITE_OTHER): Payer: BC Managed Care – PPO | Admitting: Sports Medicine

## 2012-04-30 VITALS — BP 112/75 | HR 87 | Wt 198.0 lb

## 2012-04-30 DIAGNOSIS — M797 Fibromyalgia: Secondary | ICD-10-CM

## 2012-04-30 DIAGNOSIS — E039 Hypothyroidism, unspecified: Secondary | ICD-10-CM

## 2012-04-30 DIAGNOSIS — IMO0001 Reserved for inherently not codable concepts without codable children: Secondary | ICD-10-CM

## 2012-04-30 DIAGNOSIS — Z299 Encounter for prophylactic measures, unspecified: Secondary | ICD-10-CM

## 2012-04-30 DIAGNOSIS — R030 Elevated blood-pressure reading, without diagnosis of hypertension: Secondary | ICD-10-CM

## 2012-04-30 DIAGNOSIS — E785 Hyperlipidemia, unspecified: Secondary | ICD-10-CM

## 2012-04-30 MED ORDER — GABAPENTIN (ONCE-DAILY) 600 MG PO TABS: 1800.0000 mg | ORAL_TABLET | Freq: Every day | ORAL | Status: DC

## 2012-04-30 NOTE — Assessment & Plan Note (Signed)
Increasing gabapentin 1200 mg daily. This will be the extended release form.

## 2012-04-30 NOTE — Assessment & Plan Note (Signed)
Controlled, no changes. 

## 2012-04-30 NOTE — Assessment & Plan Note (Signed)
Rechecking cholesterol, CMET, vitamin D.

## 2012-04-30 NOTE — Assessment & Plan Note (Signed)
Blood pressure is very well controlled. 

## 2012-04-30 NOTE — Progress Notes (Signed)
  Subjective:    CC: Follow up  HPI: Fibromyalgia: Currently seeing a pain management doctor, is on Nucynta, long-acting Flexeril, long-acting gabapentin, has already been on Cymbalta and Lyrica with insufficient responses. She still has pain, is wondering if I can make any changes to her regimen. She asked if I can refill her Nucynta, but understands that this is not going to happen.  Pain continues to be diffuse on the back, and thighs. She recently had a trochanteric bursa injection by her pain management doctor which was ineffective.  Pain does not radiate.  Hyperlipidemia: Present for years, no problems with Lipitor.  Elevated blood pressure: Present since last visit, resolved today.  Preventive measure: Due for some blood work.   Past medical history, Surgical history, Family history not pertinant except as noted below, Social history, Allergies, and medications have been entered into the medical record, reviewed, and no changes needed.   Review of Systems: No fevers, chills, night sweats, weight loss, chest pain, or shortness of breath.   Objective:    General: Well Developed, well nourished, and in no acute distress.  Neuro: Alert and oriented x3, extra-ocular muscles intact, sensation grossly intact.  HEENT: Normocephalic, atraumatic, pupils equal round reactive to light, neck supple, no masses, no lymphadenopathy, thyroid nonpalpable.  Skin: Warm and dry, no rashes. Cardiac: Regular rate and rhythm, no murmurs rubs or gallops, no lower extremity edema.  Respiratory: Clear to auscultation bilaterally. Not using accessory muscles, speaking in full sentences.  Impression and Recommendations:

## 2012-04-30 NOTE — Assessment & Plan Note (Signed)
Rechecking lipids and CMET is or was mild transaminitis at the last visit.

## 2012-05-02 NOTE — Telephone Encounter (Signed)
acknowledged

## 2012-05-30 ENCOUNTER — Other Ambulatory Visit (HOSPITAL_COMMUNITY): Payer: Self-pay | Admitting: Psychiatry

## 2012-05-30 ENCOUNTER — Other Ambulatory Visit: Payer: Self-pay | Admitting: Sports Medicine

## 2012-05-31 ENCOUNTER — Other Ambulatory Visit (HOSPITAL_COMMUNITY): Payer: Self-pay | Admitting: Psychiatry

## 2012-06-01 ENCOUNTER — Ambulatory Visit (INDEPENDENT_AMBULATORY_CARE_PROVIDER_SITE_OTHER): Payer: BC Managed Care – PPO | Admitting: Sports Medicine

## 2012-06-01 ENCOUNTER — Encounter: Payer: Self-pay | Admitting: Sports Medicine

## 2012-06-01 VITALS — BP 128/77 | HR 82 | Wt 201.0 lb

## 2012-06-01 DIAGNOSIS — H9319 Tinnitus, unspecified ear: Secondary | ICD-10-CM

## 2012-06-01 DIAGNOSIS — J45909 Unspecified asthma, uncomplicated: Secondary | ICD-10-CM | POA: Insufficient documentation

## 2012-06-01 DIAGNOSIS — J0100 Acute maxillary sinusitis, unspecified: Secondary | ICD-10-CM

## 2012-06-01 DIAGNOSIS — H9313 Tinnitus, bilateral: Secondary | ICD-10-CM

## 2012-06-01 DIAGNOSIS — J4530 Mild persistent asthma, uncomplicated: Secondary | ICD-10-CM

## 2012-06-01 MED ORDER — AZITHROMYCIN 250 MG PO TABS: ORAL_TABLET | ORAL | Status: DC

## 2012-06-01 MED ORDER — BECLOMETHASONE DIPROPIONATE 40 MCG/ACT IN AERS: 2.0000 | INHALATION_SPRAY | Freq: Two times a day (BID) | RESPIRATORY_TRACT | Status: DC

## 2012-06-01 MED ORDER — FLUTICASONE PROPIONATE 50 MCG/ACT NA SUSP: NASAL | Status: DC

## 2012-06-01 MED ORDER — ALBUTEROL SULFATE HFA 108 (90 BASE) MCG/ACT IN AERS: 2.0000 | INHALATION_SPRAY | RESPIRATORY_TRACT | Status: DC | PRN

## 2012-06-01 NOTE — Progress Notes (Signed)
  Subjective:    CC: Sick  HPI: Congestion: Several days of nasal stuffiness, pain in her maxillary sinuses without radiation, mild cough, no shortness of breath, no GI symptoms. Symptoms are moderate, worsening. She did have Flonase but has run out.  Asthma: Uses Qvar twice a day, unfortunately she's also run out of this, and is having a little more shortness of breath and cough. She is also in need of albuterol refills. She does have a pulmonologist.  Tinnitus/dizziness: Does endorse, for several years, persistent ringing in her ears, associated with vertiginous symptoms and room spinning. These occur rarely, and occasionally with rapid changes in head movement.  Past medical history, Surgical history, Family history not pertinant except as noted below, Social history, Allergies, and medications have been entered into the medical record, reviewed, and no changes needed.   Review of Systems: No fevers, chills, night sweats, weight loss, chest pain, or shortness of breath.   Objective:    General: Well Developed, well nourished, and in no acute distress.  Neuro: Alert and oriented x3, extra-ocular muscles intact, sensation grossly intact.  HEENT: Normocephalic, atraumatic, pupils equal round reactive to light, neck supple, no masses, no lymphadenopathy, thyroid nonpalpable.  Oropharynx, nasopharynx unremarkable, external ear canals unremarkable, tender to palpation over maxillary sinuses.  Dix-Hallpike maneuvers negative. Hearing is grossly intact. Skin: Warm and dry, no rashes. Cardiac: Regular rate and rhythm, no murmurs rubs or gallops, no lower extremity edema.  Respiratory: Clear to auscultation bilaterally. Not using accessory muscles, speaking in full sentences. Impression and Recommendations:

## 2012-06-01 NOTE — Assessment & Plan Note (Signed)
Refilling Qvar, albuterol.

## 2012-06-01 NOTE — Assessment & Plan Note (Signed)
Azithromycin, Flonase 

## 2012-06-01 NOTE — Assessment & Plan Note (Signed)
Bilateral, with occasional vertigo. With these symptoms I would like her to see ENT, this does sound like Mnire's disease.

## 2012-06-03 ENCOUNTER — Other Ambulatory Visit: Payer: Self-pay | Admitting: Sports Medicine

## 2012-06-06 ENCOUNTER — Telehealth: Payer: Self-pay | Admitting: *Deleted

## 2012-06-06 MED ORDER — PREDNISONE 50 MG PO TABS: ORAL_TABLET | ORAL | Status: DC

## 2012-06-06 NOTE — Telephone Encounter (Signed)
You gave pt abx at her visit on 06/01/2012. She states she is having a dry cough and getting choked and states she is using her emergency inhaler. She is asking if you can send something to pharmacy that will break up mucus.

## 2012-06-06 NOTE — Telephone Encounter (Signed)
I am adding 5 days of prednisone.

## 2012-06-06 NOTE — Telephone Encounter (Signed)
Pt informed.  Malikah Lakey, LPN  

## 2012-06-07 ENCOUNTER — Telehealth: Payer: Self-pay | Admitting: *Deleted

## 2012-06-07 NOTE — Telephone Encounter (Signed)
Patient calls and states that she knows this is gonna sound crazy but she feels " like her body needs to have sex". States feels flushed and weird sensitive

## 2012-06-07 NOTE — Telephone Encounter (Signed)
This is the perfect time to talk to her husband, occasionally prednisone can create a sense of increased energy and she should capitalize on it!

## 2012-06-08 NOTE — Telephone Encounter (Signed)
Pt notified and sent to schedule an appoitnment

## 2012-06-08 NOTE — Telephone Encounter (Signed)
Pt states this started before the prednisone. Today it is not as heightened but still going on. States been having a lot of night sweats and she is not on any hormone replacements right now and wonders if needs this.  Was on hormones years ago but stopped them cause she was feeling ok.  Pt states doesn't know if needs to have levels checked with bloodwork or not but hasn't had this done in awhile either. Barry Dienes, LPN

## 2012-06-08 NOTE — Telephone Encounter (Signed)
This sounds like a perimenopausal syndrome. Certainly we could try hormonal replacement, he could also increase her gabapentin dose. I would like to discuss this in person at some point.

## 2012-06-12 ENCOUNTER — Encounter: Payer: Self-pay | Admitting: Sports Medicine

## 2012-06-12 ENCOUNTER — Ambulatory Visit (INDEPENDENT_AMBULATORY_CARE_PROVIDER_SITE_OTHER): Payer: BC Managed Care – PPO | Admitting: Sports Medicine

## 2012-06-12 VITALS — BP 131/79 | HR 76

## 2012-06-12 DIAGNOSIS — N951 Menopausal and female climacteric states: Secondary | ICD-10-CM

## 2012-06-12 DIAGNOSIS — N8111 Cystocele, midline: Secondary | ICD-10-CM

## 2012-06-12 DIAGNOSIS — R3989 Other symptoms and signs involving the genitourinary system: Secondary | ICD-10-CM

## 2012-06-12 DIAGNOSIS — N8110 Cystocele, unspecified: Secondary | ICD-10-CM

## 2012-06-12 LAB — POCT URINALYSIS DIPSTICK
Bilirubin, UA: NEGATIVE
Blood, UA: NEGATIVE
Glucose, UA: NEGATIVE
Ketones, UA: NEGATIVE
Leukocytes, UA: NEGATIVE
Nitrite, UA: NEGATIVE
Protein, UA: NEGATIVE
Spec Grav, UA: 1.01
Urobilinogen, UA: 0.2
pH, UA: 5.5

## 2012-06-12 MED ORDER — ESTRADIOL 1 MG PO TABS: 1.0000 mg | ORAL_TABLET | Freq: Every day | ORAL | Status: DC

## 2012-06-12 MED ORDER — MIRABEGRON ER 25 MG PO TB24: 25.0000 mg | ORAL_TABLET | Freq: Every day | ORAL | Status: DC

## 2012-06-12 NOTE — Assessment & Plan Note (Signed)
Initiating hormone replacement. Risks and benefits explained. Return in one month.

## 2012-06-12 NOTE — Assessment & Plan Note (Signed)
Discontinuing Ditropan. Adding Myrbetriq samples.

## 2012-06-12 NOTE — Progress Notes (Signed)
  Subjective:    CC: Followup  HPI: Vague pain in pelvis: Denies vaginal discharge, no dysuria, no frequency, does have a history of stress incontinence with bladder prolapse. Gets what she describes is a vibratory sensation. She is status post hysterectomy and oophorectomy.  Hot flashes: Have been present since her hysterectomy and oopherectomy, was on hormone replacement the past and desires to go back on it.  Past medical history, Surgical history, Family history not pertinant except as noted below, Social history, Allergies, and medications have been entered into the medical record, reviewed, and no changes needed.   Review of Systems: No fevers, chills, night sweats, weight loss, chest pain, or shortness of breath.   Objective:    General: Well Developed, well nourished, and in no acute distress.  Neuro: Alert and oriented x3, extra-ocular muscles intact, sensation grossly intact.  HEENT: Normocephalic, atraumatic, pupils equal round reactive to light, neck supple, no masses, no lymphadenopathy, thyroid nonpalpable.  Skin: Warm and dry, no rashes. Cardiac: Regular rate and rhythm, no murmurs rubs or gallops, no lower extremity edema.  Respiratory: Clear to auscultation bilaterally. Not using accessory muscles, speaking in full sentences. Abdomen: Soft, only minimally tender in the suprapubic region, nondistended, normal bowel sounds, no palpable masses.  Urinalysis is negative.  Impression and Recommendations:    I spent 25 minutes with this patient, greater than 50% was face to face time counseling regarding the below diagnoses.

## 2012-06-13 LAB — LIPID PANEL
Cholesterol: 134 mg/dL (ref 0–200)
HDL: 61 mg/dL (ref >39)
LDL Cholesterol: 49 mg/dL (ref 0–99)
Total CHOL/HDL Ratio: 2.2 Ratio
Triglycerides: 119 mg/dL (ref <150)
VLDL: 24 mg/dL (ref 0–40)

## 2012-06-13 LAB — COMPREHENSIVE METABOLIC PANEL WITH GFR
ALT: 23 U/L (ref 0–35)
AST: 15 U/L (ref 0–37)
Albumin: 4.1 g/dL (ref 3.5–5.2)
BUN: 12 mg/dL (ref 6–23)
Calcium: 8.9 mg/dL (ref 8.4–10.5)
Chloride: 106 meq/L (ref 96–112)
Potassium: 3.8 meq/L (ref 3.5–5.3)

## 2012-06-13 LAB — VITAMIN D 25 HYDROXY (VIT D DEFICIENCY, FRACTURES): Vit D, 25-Hydroxy: 41 ng/mL (ref 30–89)

## 2012-06-13 LAB — COMPREHENSIVE METABOLIC PANEL
Alkaline Phosphatase: 48 U/L (ref 39–117)
CO2: 25 mEq/L (ref 19–32)
Creat: 0.94 mg/dL (ref 0.50–1.10)
Glucose, Bld: 88 mg/dL (ref 70–99)
Sodium: 141 mEq/L (ref 135–145)
Total Bilirubin: 0.7 mg/dL (ref 0.3–1.2)
Total Protein: 6.3 g/dL (ref 6.0–8.3)

## 2012-06-15 ENCOUNTER — Telehealth: Payer: Self-pay | Admitting: *Deleted

## 2012-06-15 NOTE — Telephone Encounter (Signed)
Agree with the clonazepam. This is most likely the restless leg syndrome, and we should have a visit sometime soon to actually talk about this so I can also perform a neurologic exam.

## 2012-06-15 NOTE — Telephone Encounter (Signed)
Pt called stating that her head and limbs are moving uncontrollalbly and states she does have RLS but it only happens at night and would like to know if this is something wioth the fibromyalgia or something else. Informed pt to take her Clonazepam because she is starting to have a panic attack over what is happening. Please advise.

## 2012-06-15 NOTE — Telephone Encounter (Signed)
LMOM with response.

## 2012-06-25 ENCOUNTER — Telehealth: Payer: Self-pay | Admitting: *Deleted

## 2012-06-25 DIAGNOSIS — N8110 Cystocele, unspecified: Secondary | ICD-10-CM

## 2012-06-25 MED ORDER — MIRABEGRON ER 25 MG PO TB24: 25.0000 mg | ORAL_TABLET | Freq: Every day | ORAL | Status: DC

## 2012-06-25 MED ORDER — ESTROGENS, CONJUGATED 0.625 MG/GM VA CREA: TOPICAL_CREAM | Freq: Every day | VAGINAL | Status: DC

## 2012-06-25 NOTE — Telephone Encounter (Signed)
I have added Premarin vaginal cream.

## 2012-06-25 NOTE — Telephone Encounter (Signed)
Pt notified to that Myrbepriq 25 mg sent to [facility]. Also advised pt to refer to psychiatrist  for clonazepam. Jennifer [name]

## 2012-06-25 NOTE — Telephone Encounter (Signed)
Refilling myrbetriq, noted psychiatrist has discontinued clonazepam, will need to defer to him for further benzodiazepine treatment.

## 2012-06-25 NOTE — Telephone Encounter (Signed)
Patient wants a Rx for Myrbepriq 25 mg and she stated she was taking Clonazepam she also wants a refill on that also. Jennifer [name],CMA

## 2012-06-25 NOTE — Telephone Encounter (Signed)
LMOM to return call. Kimberly Gordon, LPN  

## 2012-06-25 NOTE — Telephone Encounter (Signed)
Pt calls and states the hormone replacements you put her on are helping with the night sweats but she is still having a lot of vaginal pain and wants to know what to do for this

## 2012-06-29 ENCOUNTER — Other Ambulatory Visit (HOSPITAL_COMMUNITY): Payer: Self-pay | Admitting: Psychiatry

## 2012-07-02 ENCOUNTER — Ambulatory Visit (HOSPITAL_COMMUNITY): Payer: Self-pay | Admitting: Psychiatry

## 2012-07-10 ENCOUNTER — Other Ambulatory Visit: Payer: Self-pay | Admitting: Sports Medicine

## 2012-07-16 ENCOUNTER — Ambulatory Visit: Payer: Self-pay | Admitting: Adult Health

## 2012-07-22 ENCOUNTER — Other Ambulatory Visit: Payer: Self-pay | Admitting: Family Medicine

## 2012-07-26 ENCOUNTER — Ambulatory Visit: Payer: Self-pay | Admitting: Sports Medicine

## 2012-07-30 ENCOUNTER — Other Ambulatory Visit (HOSPITAL_COMMUNITY): Payer: Self-pay | Admitting: Psychiatry

## 2012-08-09 ENCOUNTER — Ambulatory Visit (HOSPITAL_COMMUNITY): Payer: Self-pay | Admitting: Licensed Clinical Social Worker

## 2012-08-14 ENCOUNTER — Ambulatory Visit (HOSPITAL_COMMUNITY): Payer: Self-pay | Admitting: Licensed Clinical Social Worker

## 2012-08-20 ENCOUNTER — Other Ambulatory Visit: Payer: Self-pay | Admitting: Family Medicine

## 2012-09-11 ENCOUNTER — Encounter: Payer: Self-pay | Admitting: Sports Medicine

## 2012-09-11 ENCOUNTER — Ambulatory Visit (INDEPENDENT_AMBULATORY_CARE_PROVIDER_SITE_OTHER): Payer: BC Managed Care – PPO | Admitting: Sports Medicine

## 2012-09-11 ENCOUNTER — Telehealth: Payer: Self-pay | Admitting: *Deleted

## 2012-09-11 ENCOUNTER — Telehealth (HOSPITAL_COMMUNITY): Payer: Self-pay

## 2012-09-11 ENCOUNTER — Ambulatory Visit (INDEPENDENT_AMBULATORY_CARE_PROVIDER_SITE_OTHER): Payer: BC Managed Care – PPO

## 2012-09-11 VITALS — BP 133/88 | HR 84 | Wt 200.0 lb

## 2012-09-11 DIAGNOSIS — M5412 Radiculopathy, cervical region: Secondary | ICD-10-CM

## 2012-09-11 DIAGNOSIS — M5030 Other cervical disc degeneration, unspecified cervical region: Secondary | ICD-10-CM

## 2012-09-11 DIAGNOSIS — M4802 Spinal stenosis, cervical region: Secondary | ICD-10-CM

## 2012-09-11 NOTE — Progress Notes (Signed)
  Subjective:    CC: Neck and right arm pain  HPI: This is a pleasant 52 year old female, she said several months of pain that she localizes in her neck, described as a tightness, the pain radiates down the right arm, the right lateral forearm, into the first and second fingers. Is not precipitated by any head motion, and there is nothing to palliates it. She is dropping things with her right arm. She has had physician directed home exercises as well as chiropractic care for months as well as steroids and NSAIDs. Pain is persistent.  Past medical history, Surgical history, Family history not pertinant except as noted below, Social history, Allergies, and medications have been entered into the medical record, reviewed, and no changes needed.   Review of Systems: No fevers, chills, night sweats, weight loss, chest pain, or shortness of breath.   Objective:    General: Well Developed, well nourished, and in no acute distress.  Neuro: Alert and oriented x3, extra-ocular muscles intact, sensation grossly intact.  HEENT: Normocephalic, atraumatic, pupils equal round reactive to light, neck supple, no masses, no lymphadenopathy, thyroid nonpalpable.  Skin: Warm and dry, no rashes. Cardiac: Regular rate and rhythm, no murmurs rubs or gallops, no lower extremity edema.  Respiratory: Clear to auscultation bilaterally. Not using accessory muscles, speaking in full sentences. Neck: Inspection unremarkable. No palpable stepoffs. Negative Spurling's maneuver. Full neck range of motion Grip strength and sensation normal in bilateral hands Strength good C4 to T1 distribution Mild hypoesthesia in the right C6 distribution. Negative Hoffman sign bilaterally Mildly suppressed right biceps and brachioradialis reflexes.  X-rays reviewed and shows C5-C6 degenerative disc disease with an anterior bridging osteophyte as well as straightening of the normal cervical lordosis.  Impression and Recommendations:

## 2012-09-11 NOTE — Assessment & Plan Note (Addendum)
Right-sided radicular symptoms with neck pain and tightness highly suggestive of a C6 radiculopathy. She has been through chiropractic care for months and months and months, as the loss of home stretching under physician's supervision. She desires to proceed further with interventional treatment. I am going to obtain an MRI for interventional injection plan, I do for see a right-sided C6-C7 interlaminar epidural steroid injection. Like to see her back to go over results of the MRI, and plan the intervention.  She has asked me to send my plan including my notes regarding her cervical radiculopathy to her dermatologist.

## 2012-09-11 NOTE — Telephone Encounter (Signed)
The patient reports that she is doing better, she states that her PCP has treated her today for treated. She denies any SI/HI/AVH. Advised patient to reschedule with this provider and her therapist as soon as she is feeling physically better.

## 2012-09-11 NOTE — Telephone Encounter (Signed)
First Attempt to cal patient. Left message stating I would call back.

## 2012-09-11 NOTE — Telephone Encounter (Signed)
Authorization obtained for MRI C Spine without contrast.  Auth # [id number]

## 2012-09-12 ENCOUNTER — Ambulatory Visit (INDEPENDENT_AMBULATORY_CARE_PROVIDER_SITE_OTHER): Payer: BC Managed Care – PPO | Admitting: Sports Medicine

## 2012-09-12 ENCOUNTER — Encounter: Payer: Self-pay | Admitting: Sports Medicine

## 2012-09-12 VITALS — BP 124/85 | HR 72 | Wt 202.0 lb

## 2012-09-12 DIAGNOSIS — L930 Discoid lupus erythematosus: Secondary | ICD-10-CM

## 2012-09-12 DIAGNOSIS — M5412 Radiculopathy, cervical region: Secondary | ICD-10-CM

## 2012-09-12 MED ORDER — CHLOROQUINE PHOSPHATE 250 MG PO TABS: 125.0000 mg | ORAL_TABLET | Freq: Every day | ORAL | Status: DC

## 2012-09-12 NOTE — Progress Notes (Addendum)
  Subjective:    CC: Follow up  HPI: Jennifer [name] returns to see me, she has bilateral right worse than left radicular symptoms in the C6 distribution, x-ray showed C5-C6 degenerative disc disease and subsequent MRI yesterday showed the same, the results of which will be dictated below. Symptoms are persistent. They radiate to the right arm in the C6 distribution, overall worsening.  Discoid lupus: Has been on chloroquine 125 mg daily, her rheumatologist retired and she needs a refill of the medication. Symptoms are moderate, worsening.  Past medical history, Surgical history, Family history not pertinant except as noted below, Social history, Allergies, and medications have been entered into the medical record, reviewed, and no changes needed.   Review of Systems: No fevers, chills, night sweats, weight loss, chest pain, or shortness of breath.   Objective:    General: Well Developed, well nourished, and in no acute distress.  Neuro: Alert and oriented x3, extra-ocular muscles intact, sensation grossly intact.  HEENT: Normocephalic, atraumatic, pupils equal round reactive to light, neck supple, no masses, no lymphadenopathy, thyroid nonpalpable.  Skin: Warm and dry, no rashes. Cardiac: Regular rate and rhythm, no murmurs rubs or gallops, no lower extremity edema.  Respiratory: Clear to auscultation bilaterally. Not using accessory muscles, speaking in full sentences.  We reviewed her cervical spine MRI which shows C4-C5 and C5-C6 degenerative disease, worse at the C5-C6 level with midline protrusions.  Impression and Recommendations:

## 2012-09-12 NOTE — Assessment & Plan Note (Signed)
Refilling chloroquine, she does need a referral to a new rheumatologist, her current one retired.

## 2012-09-12 NOTE — Assessment & Plan Note (Signed)
X-ray and MRI confirms C5-C6 disc protrusion, midline, likely responsible for her bilateral C6 radiculitis, with radiculopathy on the right side. She is failed conservative measures including steroids, physical therapy, NSAIDs, is using muscle relaxers. We are going to pursue right-sided interlaminar epidural steroid injection, C6-C7 level.

## 2012-09-20 ENCOUNTER — Ambulatory Visit
Admission: RE | Admit: 2012-09-20 | Discharge: 2012-09-20 | Disposition: A | Discharge status: Home / Self Care | Payer: BC Managed Care – PPO | Source: Ambulatory Visit | Attending: Sports Medicine | Admitting: Sports Medicine

## 2012-09-20 VITALS — BP 128/75 | HR 75

## 2012-09-20 DIAGNOSIS — M5412 Radiculopathy, cervical region: Secondary | ICD-10-CM

## 2012-09-20 MED ORDER — TRIAMCINOLONE ACETONIDE 40 MG/ML IJ SUSP (RADIOLOGY)
60.0000 mg | Freq: Once | INTRAMUSCULAR | Status: AC
  Administered 2012-09-20: 60 mg via EPIDURAL

## 2012-09-20 MED ORDER — IOHEXOL 300 MG/ML  SOLN
1.0000 mL | Freq: Once | INTRAMUSCULAR | Status: AC | PRN
  Administered 2012-09-20: 1 mL via EPIDURAL

## 2012-09-20 NOTE — Discharge Instructions (Signed)

## 2012-10-16 ENCOUNTER — Ambulatory Visit (INDEPENDENT_AMBULATORY_CARE_PROVIDER_SITE_OTHER): Payer: BC Managed Care – PPO | Admitting: Family Medicine

## 2012-10-16 ENCOUNTER — Encounter: Payer: Self-pay | Admitting: Family Medicine

## 2012-10-16 VITALS — BP 132/86 | HR 98 | Temp 98.1°F | Wt 201.0 lb

## 2012-10-16 DIAGNOSIS — J45909 Unspecified asthma, uncomplicated: Secondary | ICD-10-CM

## 2012-10-16 DIAGNOSIS — J45901 Unspecified asthma with (acute) exacerbation: Secondary | ICD-10-CM

## 2012-10-16 MED ORDER — AZITHROMYCIN 250 MG PO TABS: ORAL_TABLET | ORAL | Status: AC

## 2012-10-16 MED ORDER — BENZONATATE 100 MG PO CAPS: 100.0000 mg | ORAL_CAPSULE | Freq: Three times a day (TID) | ORAL | Status: DC | PRN

## 2012-10-16 MED ORDER — PREDNISONE 20 MG PO TABS: ORAL_TABLET | ORAL | Status: DC

## 2012-10-16 NOTE — Progress Notes (Signed)
CC: Jennifer [name] is a 52 y.o. female is here for Nasal Congestion and Cough   Subjective: HPI:  Complains of nonproductive cough with wheezing that has been present for the last 6 days worsening on daily basis now moderate to severe in severity. Present all hours of the day worse in the evening interfering with sleep. Associated with mild shortness of breath. Denies blood in sputum. Denies chest pain. No known sick contacts. Has been trying albuterol without much help. Nothing else makes better or worse. No change in inhaled corticosteroid regimen.  Has been associated with chills and mild night sweats without fevers. Denies confusion, headache, nasal congestion, exertional chest pain, rashes, joint or muscle pain.  Review Of Systems Outlined In HPI  Past Medical History  Diagnosis Date  . Discoid lupus   . Thyroid disease   . Perimenopausal   . Fibromyalgia   . CFS (chronic fatigue syndrome)   . Alcohol abuse   . Sleep apnea   . Asthmatic bronchitis 2013  . GERD (gastroesophageal reflux disease) 2013  . Hyperlipidemia      Family History  Problem Relation Age of Onset  . Hypertension Father   . Alcohol abuse Father   . Heart disease Sister 45    heart attack  . Heart attack Sister   . Depression Brother   . Alcohol abuse Brother     In remission  . Cancer Mother 35    colon  . Depression Brother   . Alcohol abuse Maternal Aunt   . Alcohol abuse Paternal Uncle     in remission  . Bipolar disorder Daughter 39    Suicide attempt     History  Substance Use Topics  . Smoking status: Current Every Day Smoker -- 0.30 packs/day for 36 years    Types: Cigarettes  . Smokeless tobacco: Never Used     Comment: Started smoking at age 87.  Marland Kitchen Alcohol Use: No     Comment: quit 01-2006/prior alcoholic-in AA     Objective: Filed Vitals:   10/16/12 1307  BP: 132/86  Pulse: 98  Temp: 98.1 F (36.7 C)    General: Alert and Oriented, No Acute Distress HEENT: Pupils  equal, round, reactive to light. Conjunctivae clear.  External ears unremarkable, canals clear with intact TMs with appropriate landmarks.  Middle ear appears open without effusion. Pink inferior turbinates.  Moist mucous membranes, pharynx without inflammation nor lesions.  Neck supple without palpable lymphadenopathy nor abnormal masses. Lungs: Good air movement, comfortable work of breathing, mild end expiratory wheezing with mild central rhonchi without rales or signs of consolidation Cardiac: Regular rate and rhythm. Normal S1/S2.  No murmurs, rubs, nor gallops.   Extremities: No peripheral edema.  Strong peripheral pulses.  Mental Status: No depression, anxiety, nor agitation. Skin: Warm and dry.  Assessment & Plan: Thera was seen today for nasal congestion and cough.  Diagnoses and associated orders for this visit:  Asthma, chronic - predniSONE (DELTASONE) 20 MG tablet; Three tabs at once daily for five days. - azithromycin (ZITHROMAX) 250 MG tablet; Take two tabs at once on day 1, then one tab daily on days 2-5. - benzonatate (TESSALON PERLES) 100 MG capsule; Take 1 capsule (100 mg total) by mouth 3 (three) times daily as needed for cough.  Asthma with acute exacerbation - predniSONE (DELTASONE) 20 MG tablet; Three tabs at once daily for five days. - azithromycin (ZITHROMAX) 250 MG tablet; Take two tabs at once on day 1, then one  tab daily on days 2-5. - benzonatate (TESSALON PERLES) 100 MG capsule; Take 1 capsule (100 mg total) by mouth 3 (three) times daily as needed for cough.    Has an exacerbation: Start prednisone, start azithromycin, Tessalon Perles as needed to help with cough. Signs and symptoms requring emergent/urgent reevaluation were discussed with the patient. Followup with PCP after resolution of symptoms to discuss asthma control  Return if symptoms worsen or fail to improve.

## 2012-10-18 ENCOUNTER — Telehealth: Payer: Self-pay | Admitting: *Deleted

## 2012-10-18 NOTE — Telephone Encounter (Signed)
I bet this has to do with the prednisone, I'd encourage her to stop the prednisone but continue with full course of azithromycin.  Prednisone can cause some pretty odd side effects including insomnia and hallucinations.

## 2012-10-18 NOTE — Telephone Encounter (Signed)
Pt notified to stop taking the prednisone.

## 2012-10-18 NOTE — Telephone Encounter (Signed)
Pt left message stating that since she saw you the other day she has hardly gotten any sleep & last night she had some hallucinations.

## 2012-10-26 ENCOUNTER — Ambulatory Visit (INDEPENDENT_AMBULATORY_CARE_PROVIDER_SITE_OTHER): Payer: BC Managed Care – PPO | Admitting: Sports Medicine

## 2012-10-26 ENCOUNTER — Encounter: Payer: Self-pay | Admitting: Sports Medicine

## 2012-10-26 VITALS — BP 117/80 | HR 95 | Wt 202.0 lb

## 2012-10-26 DIAGNOSIS — M5412 Radiculopathy, cervical region: Secondary | ICD-10-CM

## 2012-10-26 DIAGNOSIS — J4530 Mild persistent asthma, uncomplicated: Secondary | ICD-10-CM

## 2012-10-26 DIAGNOSIS — J45909 Unspecified asthma, uncomplicated: Secondary | ICD-10-CM

## 2012-10-26 MED ORDER — METHYLPREDNISOLONE SODIUM SUCC 125 MG IJ SOLR
250.0000 mg | Freq: Once | INTRAMUSCULAR | Status: AC
  Administered 2012-10-26: 250 mg via INTRAMUSCULAR

## 2012-10-26 MED ORDER — MOMETASONE FURO-FORMOTEROL FUM 200-5 MCG/ACT IN AERO: 2.0000 | INHALATION_SPRAY | Freq: Two times a day (BID) | RESPIRATORY_TRACT | Status: DC

## 2012-10-26 MED ORDER — ALBUTEROL SULFATE HFA 108 (90 BASE) MCG/ACT IN AERS: 2.0000 | INHALATION_SPRAY | RESPIRATORY_TRACT | Status: DC | PRN

## 2012-10-26 NOTE — Assessment & Plan Note (Addendum)
Currently in exacerbation partially treated by previous prednisone, and azithromycin. She had to stop the prednisone due to hallucinations, she had no problems with Depo-Medrol for her epidural. Solu-Medrol 250 intramuscular, switching from QVAR to [facility]. Continue as needed rapid acting bronchodilator. Return in one month to see how things are going. She has been getting a lot of steroids lately, if her fatigue does not resolve, would certainly like to look further into iatrogenic adrenal insufficiency.

## 2012-10-26 NOTE — Progress Notes (Signed)
  Subjective:    CC: Cough, wheeze  HPI: Asthma: Jennifer [name] has been treated in the recent past for an asthma exacerbation appropriately with prednisone, azithromycin, Tessalon Perles. Unfortunately she started to have some hallucinations on prednisone, and stopped it. She only uses Qvar twice a day, and does not have a rescue inhaler she tells me. She is significantly better since starting the antibiotics but still has some wheeze. She has been noting more asthma exacerbations recently. She does have cough, wheeze, mild shortness of breath, mildly productive cough that is overall improving.  Cervical radiculitis: Resolved with epidural injection.  Past medical history, Surgical history, Family history not pertinant except as noted below, Social history, Allergies, and medications have been entered into the medical record, reviewed, and no changes needed.   Review of Systems: No fevers, chills, night sweats, weight loss, chest pain, or shortness of breath.   Objective:    General: Well Developed, well nourished, and in no acute distress.  Neuro: Alert and oriented x3, extra-ocular muscles intact, sensation grossly intact.  HEENT: Normocephalic, atraumatic, pupils equal round reactive to light, neck supple, no masses, no lymphadenopathy, thyroid nonpalpable.  Skin: Warm and dry, no rashes. Cardiac: Regular rate and rhythm, no murmurs rubs or gallops, no lower extremity edema.  Respiratory: Mild expiratory wheezes, Not using accessory muscles, speaking in full sentences. Impression and Recommendations:

## 2012-10-26 NOTE — Assessment & Plan Note (Signed)
Resolved with epidural 

## 2012-10-30 ENCOUNTER — Other Ambulatory Visit (HOSPITAL_COMMUNITY): Payer: Self-pay | Admitting: Psychiatry

## 2012-10-30 ENCOUNTER — Other Ambulatory Visit: Payer: Self-pay | Admitting: Sports Medicine

## 2012-11-06 ENCOUNTER — Ambulatory Visit (HOSPITAL_COMMUNITY): Payer: BC Managed Care – PPO | Admitting: Licensed Clinical Social Worker

## 2012-11-06 ENCOUNTER — Other Ambulatory Visit: Payer: Self-pay

## 2012-11-06 ENCOUNTER — Telehealth: Payer: Self-pay

## 2012-11-06 DIAGNOSIS — J4530 Mild persistent asthma, uncomplicated: Secondary | ICD-10-CM

## 2012-11-06 MED ORDER — ALBUTEROL SULFATE HFA 108 (90 BASE) MCG/ACT IN AERS: 2.0000 | INHALATION_SPRAY | RESPIRATORY_TRACT | Status: DC | PRN

## 2012-11-06 NOTE — Telephone Encounter (Signed)
Patient request refill for Albuterol. Rhonda Cunningham,CMA

## 2012-11-06 NOTE — Telephone Encounter (Signed)
error 

## 2012-11-09 ENCOUNTER — Telehealth: Payer: Self-pay | Admitting: *Deleted

## 2012-11-09 NOTE — Telephone Encounter (Signed)
I want answers too, have her come back so we can proceed to the next step if needed.

## 2012-11-09 NOTE — Telephone Encounter (Signed)
Pt notified & has an appt on the 13th.

## 2012-11-09 NOTE — Telephone Encounter (Signed)
Pt left message today that she is feeling no better at all since she was seen by you & hommel in the last 3 weeks.  She states that she "wants answers".  Would you like for her to come back in to be seen? Please advise.

## 2012-11-12 ENCOUNTER — Other Ambulatory Visit: Payer: Self-pay | Admitting: Sports Medicine

## 2012-11-15 ENCOUNTER — Ambulatory Visit (INDEPENDENT_AMBULATORY_CARE_PROVIDER_SITE_OTHER): Payer: BC Managed Care – PPO | Admitting: Sports Medicine

## 2012-11-15 ENCOUNTER — Encounter: Payer: Self-pay | Admitting: Sports Medicine

## 2012-11-15 VITALS — BP 107/70 | HR 81 | Wt 205.0 lb

## 2012-11-15 DIAGNOSIS — J45909 Unspecified asthma, uncomplicated: Secondary | ICD-10-CM

## 2012-11-15 MED ORDER — MONTELUKAST SODIUM 10 MG PO TABS: 10.0000 mg | ORAL_TABLET | Freq: Every day | ORAL | Status: DC

## 2012-11-15 MED ORDER — NYSTATIN 100000 UNIT/ML MT SUSP: 500000.0000 [IU] | Freq: Four times a day (QID) | OROMUCOSAL | Status: DC

## 2012-11-15 NOTE — Assessment & Plan Note (Signed)
Her PFTs have been normal in the past with the pulmonologist. She does however have good control with Dulera, fortunately she is getting some thrush which I will treat with nystatin. I am going to give her some samples and add Singulair as she does have significant allergy to cats.

## 2012-11-15 NOTE — Patient Instructions (Signed)

## 2012-11-15 NOTE — Progress Notes (Signed)
  Subjective:    CC: Followup  HPI: Asthma: Has been through multiple episodes of chronic bronchitis exacerbations, she's been treated with multiple rounds of steroids and antibiotics, more recently I switched her from Qvar to [facility].  She feels much better he is using much less of her rescue inhaler. She does note that her symptoms are essentially resolved when she is away from the house, and when she is near the cats, her symptoms worsen. Her husband would rather get rid of her than the cat at this point. Symptoms are mild, improving.  Past medical history, Surgical history, Family history not pertinant except as noted below, Social history, Allergies, and medications have been entered into the medical record, reviewed, and no changes needed.   Review of Systems: No fevers, chills, night sweats, weight loss, chest pain, or shortness of breath.   Objective:    General: Well Developed, well nourished, and in no acute distress.  Neuro: Alert and oriented x3, extra-ocular muscles intact, sensation grossly intact.  HEENT: Normocephalic, atraumatic, pupils equal round reactive to light, neck supple, no masses, no lymphadenopathy, thyroid nonpalpable.  Skin: Warm and dry, no rashes. Cardiac: Regular rate and rhythm, no murmurs rubs or gallops, no lower extremity edema.  Respiratory: Clear to auscultation bilaterally. Not using accessory muscles, speaking in full sentences.  Impression and Recommendations:    The patient had multiple questions regarding asthma, progression, and treatment, I. spent greater than 25 minutes with this patient, 50% was face-to-face time counseling regarding asthma.

## 2012-11-19 ENCOUNTER — Ambulatory Visit: Payer: BC Managed Care – PPO | Admitting: Sports Medicine

## 2012-11-21 ENCOUNTER — Ambulatory Visit (INDEPENDENT_AMBULATORY_CARE_PROVIDER_SITE_OTHER): Payer: BC Managed Care – PPO | Admitting: Sports Medicine

## 2012-11-21 ENCOUNTER — Telehealth: Payer: Self-pay | Admitting: *Deleted

## 2012-11-21 ENCOUNTER — Encounter: Payer: Self-pay | Admitting: Sports Medicine

## 2012-11-21 ENCOUNTER — Ambulatory Visit (INDEPENDENT_AMBULATORY_CARE_PROVIDER_SITE_OTHER): Payer: BC Managed Care – PPO

## 2012-11-21 VITALS — BP 129/76 | HR 87 | Wt 203.0 lb

## 2012-11-21 DIAGNOSIS — M25473 Effusion, unspecified ankle: Secondary | ICD-10-CM

## 2012-11-21 DIAGNOSIS — M25572 Pain in left ankle and joints of left foot: Secondary | ICD-10-CM

## 2012-11-21 DIAGNOSIS — M25579 Pain in unspecified ankle and joints of unspecified foot: Secondary | ICD-10-CM

## 2012-11-21 DIAGNOSIS — S82899A Other fracture of unspecified lower leg, initial encounter for closed fracture: Secondary | ICD-10-CM

## 2012-11-21 DIAGNOSIS — S82832A Other fracture of upper and lower end of left fibula, initial encounter for closed fracture: Secondary | ICD-10-CM | POA: Insufficient documentation

## 2012-11-21 MED ORDER — TRAMADOL HCL 50 MG PO TABS: ORAL_TABLET | ORAL | Status: DC

## 2012-11-21 NOTE — Progress Notes (Signed)
  Subjective:    CC: Ankle injury  HPI: Yesterday at church, [place] inverted her left ankle, she had immediate pain, swelling and bruising. Pain is localized over the tip of the lateral malleolus, she is able to bear weight, pain is severe, persistent. No radiation. Worse with weightbearing.  Past medical history, Surgical history, Family history not pertinant except as noted below, Social history, Allergies, and medications have been entered into the medical record, reviewed, and no changes needed.   Review of Systems: No fevers, chills, night sweats, weight loss, chest pain, or shortness of breath.   Objective:    General: Well Developed, well nourished, and in no acute distress.  Neuro: Alert and oriented x3, extra-ocular muscles intact, sensation grossly intact.  HEENT: Normocephalic, atraumatic, pupils equal round reactive to light, neck supple, no masses, no lymphadenopathy, thyroid nonpalpable.  Skin: Warm and dry, no rashes. Cardiac: Regular rate and rhythm, no murmurs rubs or gallops, no lower extremity edema.  Respiratory: Clear to auscultation bilaterally. Not using accessory muscles, speaking in full sentences. Left ankle is swollen, bruised, tender to palpation over the ATFL., and tip of the malleolus.  Ankle was strapping with compressive dressing.  X-rays were reviewed and show a linear lucency through the tip of the lateral malleolus.  Impression and Recommendations:

## 2012-11-21 NOTE — Telephone Encounter (Signed)
X-rays were ordered, I will see her today.

## 2012-11-21 NOTE — Telephone Encounter (Signed)
Pt called and states she thinks she broke her left ankle last pm. Pt states she stepped off a a step at church and fell. Pt states the ankle was swollen and is very painful today. Pt was advised to come now and head straight to imaging. Left ankle on the left side

## 2012-11-21 NOTE — Assessment & Plan Note (Addendum)
Limited in terms of pain medication, we will use tramadol at the max dose. Strap with compressive dressing, Cam Boot, return in 2 weeks, x-ray before visit.  I billed a fracture code for this visit, all subsequent visits for this complaint will be "post-op checks" in the global period.

## 2012-11-21 NOTE — Telephone Encounter (Signed)
Pt called and states the tramadol in not helping with her pain. Per Dr. Benjamin Stain he is very limited as to what he rx's for pain for her because of her allergies. Advised pt that per Dr. Karie Schwalbe she can take two aleve and she needs to call her pain management doctor. Pt became very tearful and states they will not discuss any medication adjustments over the phone. Advised pt that being that she has so many allergies to different pain medications she would need to discuss what she can take with her pain management doctor since that is now going beyond Dr. Melvia Heaps scope of practice. Pt voiced understanding

## 2012-11-22 ENCOUNTER — Ambulatory Visit: Payer: BC Managed Care – PPO | Admitting: Sports Medicine

## 2012-11-23 ENCOUNTER — Telehealth: Payer: Self-pay

## 2012-11-23 NOTE — Telephone Encounter (Signed)
Jennifer [name] states she still has leg pain when she walks. I advised her to stay off her foot as much as possible.

## 2012-11-23 NOTE — Telephone Encounter (Signed)
Noted, she does have a fracture that is brand-new, and as expected, limited weight bearing for the next 2 weeks as you discussed.

## 2012-11-28 ENCOUNTER — Other Ambulatory Visit: Payer: Self-pay | Admitting: Sports Medicine

## 2012-12-03 ENCOUNTER — Telehealth: Payer: Self-pay | Admitting: Sports Medicine

## 2012-12-03 ENCOUNTER — Ambulatory Visit (INDEPENDENT_AMBULATORY_CARE_PROVIDER_SITE_OTHER): Payer: BC Managed Care – PPO | Admitting: Sports Medicine

## 2012-12-03 ENCOUNTER — Encounter: Payer: Self-pay | Admitting: Sports Medicine

## 2012-12-03 ENCOUNTER — Ambulatory Visit (INDEPENDENT_AMBULATORY_CARE_PROVIDER_SITE_OTHER): Payer: BC Managed Care – PPO

## 2012-12-03 VITALS — BP 127/80 | HR 96

## 2012-12-03 DIAGNOSIS — S82899A Other fracture of unspecified lower leg, initial encounter for closed fracture: Secondary | ICD-10-CM

## 2012-12-03 DIAGNOSIS — N8110 Cystocele, unspecified: Secondary | ICD-10-CM

## 2012-12-03 DIAGNOSIS — M797 Fibromyalgia: Secondary | ICD-10-CM

## 2012-12-03 DIAGNOSIS — M25572 Pain in left ankle and joints of left foot: Secondary | ICD-10-CM

## 2012-12-03 DIAGNOSIS — S82832A Other fracture of upper and lower end of left fibula, initial encounter for closed fracture: Secondary | ICD-10-CM

## 2012-12-03 DIAGNOSIS — N8111 Cystocele, midline: Secondary | ICD-10-CM

## 2012-12-03 DIAGNOSIS — S8290XD Unspecified fracture of unspecified lower leg, subsequent encounter for closed fracture with routine healing: Secondary | ICD-10-CM

## 2012-12-03 DIAGNOSIS — IMO0001 Reserved for inherently not codable concepts without codable children: Secondary | ICD-10-CM

## 2012-12-03 MED ORDER — ESTROGENS, CONJUGATED 0.625 MG/GM VA CREA: TOPICAL_CREAM | Freq: Every day | VAGINAL | Status: DC

## 2012-12-03 MED ORDER — GABAPENTIN 800 MG PO TABS: 800.0000 mg | ORAL_TABLET | Freq: Every day | ORAL | Status: DC

## 2012-12-03 NOTE — Assessment & Plan Note (Signed)
Doing well 2 weeks outside of her fracture. Continue boot for 2 more weeks. Return in 2 weeks, I will likely transition her into an ankle brace, she has one at home and will bring it with her.

## 2012-12-03 NOTE — Assessment & Plan Note (Signed)
Refilling estrogen cream.

## 2012-12-03 NOTE — Assessment & Plan Note (Signed)
Refilling gabapentin, patient desires standard release 800 mg at bedtime.

## 2012-12-03 NOTE — Progress Notes (Signed)
  Subjective:    CC: Follow up  HPI: Left fibular fracture: Doing well 2 weeks status post fracture.  Menopausal : Needs a refill on estrogen cream.  Fibromyalgia: Needs a refill of gabapentin, desires 800 mg immediate release generic.  Past medical history, Surgical history, Family history not pertinant except as noted below, Social history, Allergies, and medications have been entered into the medical record, reviewed, and no changes needed.   Review of Systems: No fevers, chills, night sweats, weight loss, chest pain, or shortness of breath.   Objective:    General: Well Developed, well nourished, and in no acute distress.  Neuro: Alert and oriented x3, extra-ocular muscles intact, sensation grossly intact.  HEENT: Normocephalic, atraumatic, pupils equal round reactive to light, neck supple, no masses, no lymphadenopathy, thyroid nonpalpable.  Skin: Warm and dry, no rashes. Cardiac: Regular rate and rhythm, no murmurs rubs or gallops, no lower extremity edema.  Respiratory: Clear to auscultation bilaterally. Not using accessory muscles, speaking in full sentences. Left ankle: Minimally tender to palpation over the fracture site, no further bruising or swelling.  X-rays were reviewed and do show healing of the fracture.  Impression and Recommendations:

## 2012-12-12 ENCOUNTER — Other Ambulatory Visit: Payer: Self-pay | Admitting: Sports Medicine

## 2012-12-17 ENCOUNTER — Encounter: Payer: Self-pay | Admitting: Sports Medicine

## 2012-12-17 ENCOUNTER — Ambulatory Visit (INDEPENDENT_AMBULATORY_CARE_PROVIDER_SITE_OTHER): Payer: BC Managed Care – PPO | Admitting: Sports Medicine

## 2012-12-17 VITALS — BP 134/89 | HR 96

## 2012-12-17 DIAGNOSIS — N8111 Cystocele, midline: Secondary | ICD-10-CM

## 2012-12-17 DIAGNOSIS — J4489 Other specified chronic obstructive pulmonary disease: Secondary | ICD-10-CM

## 2012-12-17 DIAGNOSIS — S82832A Other fracture of upper and lower end of left fibula, initial encounter for closed fracture: Secondary | ICD-10-CM

## 2012-12-17 DIAGNOSIS — N8110 Cystocele, unspecified: Secondary | ICD-10-CM

## 2012-12-17 DIAGNOSIS — J449 Chronic obstructive pulmonary disease, unspecified: Secondary | ICD-10-CM

## 2012-12-17 MED ORDER — MIRABEGRON ER 25 MG PO TB24: 25.0000 mg | ORAL_TABLET | Freq: Every day | ORAL | Status: DC

## 2012-12-17 MED ORDER — MONTELUKAST SODIUM 10 MG PO TABS: 10.0000 mg | ORAL_TABLET | Freq: Every day | ORAL | Status: DC

## 2012-12-17 NOTE — Assessment & Plan Note (Signed)
Refilling Myrbetriq, samples given and discomfort given.

## 2012-12-17 NOTE — Assessment & Plan Note (Signed)
Continue current medications. Needs to ramp up her Flonase. Refilling Singulair.

## 2012-12-17 NOTE — Progress Notes (Signed)
  Subjective:    CC: Followup  HPI: Fracture: Four-week status post fracture of the left distal fibula. Continues to be in the [facility], still some pain with ambulation.  Stress and urge mixed incontinence : Symptoms are completely resolved Myrbetriq.  Chronic bronchitis: Needs a refill on Singulair, is doing very well on current regimen, does have some increased mucus production in her pharynx. She is not using her Flonase.  Past medical history, Surgical history, Family history not pertinant except as noted below, Social history, Allergies, and medications have been entered into the medical record, reviewed, and no changes needed.   Review of Systems: No fevers, chills, night sweats, weight loss, chest pain, or shortness of breath.   Objective:    General: Well Developed, well nourished, and in no acute distress.  Neuro: Alert and oriented x3, extra-ocular muscles intact, sensation grossly intact.  HEENT: Normocephalic, atraumatic, pupils equal round reactive to light, neck supple, no masses, no lymphadenopathy, thyroid nonpalpable.  Skin: Warm and dry, no rashes. Cardiac: Regular rate and rhythm, no murmurs rubs or gallops, no lower extremity edema.  Respiratory: Clear to auscultation bilaterally. Not using accessory muscles, speaking in full sentences.  left ankle: Still with minimal tenderness to palpation over the distal fibular shaft.   Impression and Recommendations:

## 2012-12-17 NOTE — Assessment & Plan Note (Signed)
Continues to do well, continue Boot as she still has significant pain over the fracture site. Return to see me in 2 weeks, I do anticipate a total of 6-8 weeks for healing. If she is no longer tender over the fracture site we can switch her into a stirrup brace. She has one and will bring it with her next time.

## 2012-12-31 ENCOUNTER — Other Ambulatory Visit (HOSPITAL_COMMUNITY): Payer: Self-pay | Admitting: Psychiatry

## 2012-12-31 ENCOUNTER — Other Ambulatory Visit: Payer: Self-pay | Admitting: Sports Medicine

## 2013-01-08 ENCOUNTER — Telehealth: Payer: Self-pay

## 2013-01-08 ENCOUNTER — Ambulatory Visit: Payer: BC Managed Care – PPO | Admitting: Sports Medicine

## 2013-01-08 NOTE — Telephone Encounter (Signed)
Spoke to patient instructed her to make an appt as noted below. Zacharius Funari,CMA

## 2013-01-08 NOTE — Telephone Encounter (Signed)
If insufficient response to previous measures should definitely see me.

## 2013-01-08 NOTE — Telephone Encounter (Signed)
Patient called wanting to know what she can take or do for her bronchitis please advise that patient did have appt scheduled for F/U today at 2:45 pm that she cancelled because she was too weak to come in. Akin Yi,CMA

## 2013-01-10 ENCOUNTER — Ambulatory Visit (INDEPENDENT_AMBULATORY_CARE_PROVIDER_SITE_OTHER): Payer: BC Managed Care – PPO | Admitting: Sports Medicine

## 2013-01-10 ENCOUNTER — Encounter: Payer: Self-pay | Admitting: Sports Medicine

## 2013-01-10 VITALS — BP 146/91 | HR 94 | Wt 204.0 lb

## 2013-01-10 DIAGNOSIS — R259 Unspecified abnormal involuntary movements: Secondary | ICD-10-CM

## 2013-01-10 DIAGNOSIS — R251 Tremor, unspecified: Secondary | ICD-10-CM | POA: Insufficient documentation

## 2013-01-10 DIAGNOSIS — J449 Chronic obstructive pulmonary disease, unspecified: Secondary | ICD-10-CM

## 2013-01-10 DIAGNOSIS — S82832A Other fracture of upper and lower end of left fibula, initial encounter for closed fracture: Secondary | ICD-10-CM

## 2013-01-10 DIAGNOSIS — E785 Hyperlipidemia, unspecified: Secondary | ICD-10-CM

## 2013-01-10 DIAGNOSIS — J4489 Other specified chronic obstructive pulmonary disease: Secondary | ICD-10-CM

## 2013-01-10 MED ORDER — ATORVASTATIN CALCIUM 40 MG PO TABS: 40.0000 mg | ORAL_TABLET | Freq: Every day | ORAL | Status: DC

## 2013-01-10 NOTE — Assessment & Plan Note (Signed)
Etiology is unclear, and patient was having difficulty describing her symptoms. This does not sound cardiac or neurologic, it is likely related to fibromyalgia. We will keep an eye on this, and I am going to check some routine blood work regarding this.

## 2013-01-10 NOTE — Assessment & Plan Note (Signed)
Normal spirometry, no true COPD per prior pulmonology reports however she does feel significantly better with use of inhalers. She does desire to see Dr. Delford FieldWright again I am going to provide referral.

## 2013-01-10 NOTE — Assessment & Plan Note (Signed)
Refilling atorvastatin

## 2013-01-10 NOTE — Assessment & Plan Note (Signed)
Healed, return as needed for this.

## 2013-01-10 NOTE — Progress Notes (Signed)
  Subjective:    CC: Recheck fracture  HPI: Left lateral malleolar fracture: 6 weeks out, overall pain-free on the fracture site, able to ambulate without pain.  Shortness of breath: Has seen the pulmonologist in the past, she has had normal spirometry and no true COPD, she has noted a benefit with Qvar and Dulera inhalers. She also consistently notes chest "congestion."  Shakiness: Patient is unable to describe what she means by this, her vision is clear, She has no presyncopal symptoms, no vertiginous symptoms. Her balance is normal, when she takes a deep breath her symptoms resolve.  Past medical history, Surgical history, Family history not pertinant except as noted below, Social history, Allergies, and medications have been entered into the medical record, reviewed, and no changes needed.   Review of Systems: No fevers, chills, night sweats, weight loss, chest pain, or shortness of breath.   Objective:    General: Well Developed, well nourished, and in no acute distress.  Neuro: Alert and oriented x3, extra-ocular muscles intact, sensation grossly intact.  HEENT: Normocephalic, atraumatic, pupils equal round reactive to light, neck supple, no masses, no lymphadenopathy, thyroid nonpalpable.  Skin: Warm and dry, no rashes. Cardiac: Regular rate and rhythm, no murmurs rubs or gallops, no lower extremity edema.  Respiratory: Clear to auscultation bilaterally. Not using accessory muscles, speaking in full sentences. Left ankle: Good motion, good strength, no longer tender to palpation of the fracture site.  Impression and Recommendations:

## 2013-01-11 ENCOUNTER — Other Ambulatory Visit: Payer: Self-pay

## 2013-01-11 LAB — COMPREHENSIVE METABOLIC PANEL WITH GFR
ALT: 24 U/L (ref 0–35)
Albumin: 4.6 g/dL (ref 3.5–5.2)
CO2: 28 meq/L (ref 19–32)
Glucose, Bld: 109 mg/dL — ABNORMAL HIGH (ref 70–99)
Potassium: 3.9 meq/L (ref 3.5–5.3)
Sodium: 138 meq/L (ref 135–145)

## 2013-01-11 LAB — CBC
HCT: 41.1 % (ref 36.0–46.0)
Hemoglobin: 14.7 g/dL (ref 12.0–15.0)
MCH: 32.2 pg (ref 26.0–34.0)
MCHC: 35.8 g/dL (ref 30.0–36.0)
MCV: 89.9 fL (ref 78.0–100.0)
Platelets: 157 K/uL (ref 150–400)
RBC: 4.57 MIL/uL (ref 3.87–5.11)
RDW: 13.4 % (ref 11.5–15.5)
WBC: 4 10*3/uL (ref 4.0–10.5)

## 2013-01-11 LAB — COMPREHENSIVE METABOLIC PANEL
AST: 19 U/L (ref 0–37)
Alkaline Phosphatase: 63 U/L (ref 39–117)
BUN: 11 mg/dL (ref 6–23)
Calcium: 9.7 mg/dL (ref 8.4–10.5)
Chloride: 102 mEq/L (ref 96–112)
Creat: 0.9 mg/dL (ref 0.50–1.10)
Total Bilirubin: 0.7 mg/dL (ref 0.3–1.2)
Total Protein: 7.1 g/dL (ref 6.0–8.3)

## 2013-01-11 LAB — TSH: TSH: 0.548 u[IU]/mL (ref 0.350–4.500)

## 2013-01-11 LAB — CORTISOL: Cortisol, Plasma: 7 ug/dL

## 2013-01-14 ENCOUNTER — Ambulatory Visit (HOSPITAL_COMMUNITY): Payer: BC Managed Care – PPO | Admitting: Psychiatry

## 2013-01-18 NOTE — Telephone Encounter (Signed)
Refill request for lamictal and paroxetine. Called patient. She continues to take Lamictal but has not had Paxil for 3 denies. She denies any rash or side effects. She denies any SI/HI/AVH. She has missed her last appointment and has rescheduled.   PLAN:  Will provide a 30 days supply of Lamictal and Paroxetine.

## 2013-02-12 ENCOUNTER — Encounter (INDEPENDENT_AMBULATORY_CARE_PROVIDER_SITE_OTHER): Payer: Self-pay

## 2013-02-12 ENCOUNTER — Ambulatory Visit (INDEPENDENT_AMBULATORY_CARE_PROVIDER_SITE_OTHER): Payer: BC Managed Care – PPO | Admitting: Psychiatry

## 2013-02-12 ENCOUNTER — Encounter (HOSPITAL_COMMUNITY): Payer: Self-pay | Admitting: Psychiatry

## 2013-02-12 VITALS — BP 125/85 | HR 59 | Wt 201.0 lb

## 2013-02-12 DIAGNOSIS — G4700 Insomnia, unspecified: Secondary | ICD-10-CM

## 2013-02-12 DIAGNOSIS — F332 Major depressive disorder, recurrent severe without psychotic features: Secondary | ICD-10-CM

## 2013-02-12 DIAGNOSIS — F1021 Alcohol dependence, in remission: Secondary | ICD-10-CM

## 2013-02-12 MED ORDER — PAROXETINE HCL 40 MG PO TABS: 40.0000 mg | ORAL_TABLET | ORAL | Status: DC

## 2013-02-12 MED ORDER — TRAZODONE HCL 150 MG PO TABS: 150.0000 mg | ORAL_TABLET | Freq: Every day | ORAL | Status: DC

## 2013-02-12 MED ORDER — LAMOTRIGINE 100 MG PO TABS: 100.0000 mg | ORAL_TABLET | Freq: Every day | ORAL | Status: DC
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
## 2013-02-17 ENCOUNTER — Other Ambulatory Visit (HOSPITAL_COMMUNITY): Payer: Self-pay | Admitting: Psychiatry

## 2013-02-18 NOTE — Telephone Encounter (Signed)
Medications have already been filled as confirmed by the pharmacy.

## 2013-02-23 ENCOUNTER — Other Ambulatory Visit (HOSPITAL_COMMUNITY): Payer: Self-pay | Admitting: Psychiatry

## 2013-03-01 ENCOUNTER — Other Ambulatory Visit: Payer: Self-pay | Admitting: Sports Medicine

## 2013-03-01 NOTE — Telephone Encounter (Signed)
Refilled trazodone.

## 2013-03-25 ENCOUNTER — Encounter: Payer: Self-pay | Admitting: Sports Medicine

## 2013-03-25 ENCOUNTER — Ambulatory Visit (INDEPENDENT_AMBULATORY_CARE_PROVIDER_SITE_OTHER): Payer: BC Managed Care – PPO | Admitting: Sports Medicine

## 2013-03-25 VITALS — BP 132/84 | HR 87 | Wt 201.0 lb

## 2013-03-25 DIAGNOSIS — M797 Fibromyalgia: Secondary | ICD-10-CM

## 2013-03-25 DIAGNOSIS — J449 Chronic obstructive pulmonary disease, unspecified: Secondary | ICD-10-CM

## 2013-03-25 DIAGNOSIS — N951 Menopausal and female climacteric states: Secondary | ICD-10-CM

## 2013-03-25 DIAGNOSIS — IMO0001 Reserved for inherently not codable concepts without codable children: Secondary | ICD-10-CM

## 2013-03-25 DIAGNOSIS — J4489 Other specified chronic obstructive pulmonary disease: Secondary | ICD-10-CM

## 2013-03-25 DIAGNOSIS — L930 Discoid lupus erythematosus: Secondary | ICD-10-CM

## 2013-03-25 MED ORDER — GABAPENTIN 800 MG PO TABS
800.0000 mg | ORAL_TABLET | Freq: Two times a day (BID) | ORAL | Status: DC
Start: 2013-03-25 — End: 2013-06-12

## 2013-03-25 MED ORDER — PREDNISONE (PAK) 10 MG PO TABS: ORAL_TABLET | ORAL | Status: DC

## 2013-03-25 NOTE — Patient Instructions (Signed)

## 2013-03-25 NOTE — Assessment & Plan Note (Signed)
Overall doing well on current medications, starting to get a mild cough which she thinks is predominately due to allergies. Adding prednisone to get her through this, she also is having a flare of lupus, prednisone will help this as well.

## 2013-03-25 NOTE — Assessment & Plan Note (Signed)
Lupus flare, prednisone taper, [place] rheumat[place], she does not desire to continue seeing her [facility]ologist at [facility].

## 2013-03-25 NOTE — Assessment & Plan Note (Signed)
Increasing dose, 800 mg twice a day

## 2013-03-25 NOTE — Progress Notes (Signed)
  Subjective:    CC: Followup  HPI: Lupus: This is a questionable diagnosis, she [facility] have a [facility] and she stopped seeing. She tells me that her flares typically start with pin-like sensations in her eyes, and some muscle tightness. She has been off of eyedrops it for quite and methotrexate for some time now. She thinks she is having a flare now.  Hormone replacement: Wonders about her risk of blood clots, we had already discussed this we started her on it, she has felt significantly better however from a gynecologic perspective in terms of energy as well as vasomotor instability perspective since starting estradiol. She has since stopped Premarin cream.  COPD: Tells me she is having a small increase in cough, nonproductive.  Fibromyalgia: Doing well on gabapentin, would like to increase the dose.  Past medical history, Surgical history, Family history not pertinant except as noted below, Social history, Allergies, and medications have been entered into the medical record, reviewed, and no changes needed.   Review of Systems: No fevers, chills, night sweats, weight loss, chest pain, or shortness of breath.   Objective:    General: Well Developed, well nourished, and in no acute distress.  Neuro: Alert and oriented x3, extra-ocular muscles intact, sensation grossly intact.  HEENT: Normocephalic, atraumatic, pupils equal round reactive to light, neck supple, no masses, no lymphadenopathy, thyroid nonpalpable.  Skin: Warm and dry, no rashes. Cardiac: Regular rate and rhythm, no murmurs rubs or gallops, no lower extremity edema.  Respiratory: Clear to auscultation bilaterally. Not using accessory muscles, speaking in full sentences.   Impression and Recommendations:

## 2013-03-25 NOTE — Assessment & Plan Note (Signed)
Has been doing extremely well with low dose estrogen supplementation. She is worried about her risk of blood clots which we did explain was low but higher. I have asked her to weigh the risks and think about it, if she desires she could come off of her estrogen.

## 2013-03-31 ENCOUNTER — Other Ambulatory Visit (HOSPITAL_COMMUNITY): Payer: Self-pay | Admitting: Psychiatry

## 2013-03-31 DIAGNOSIS — G4700 Insomnia, unspecified: Secondary | ICD-10-CM

## 2013-04-03 ENCOUNTER — Telehealth (HOSPITAL_COMMUNITY): Payer: Self-pay

## 2013-04-04 ENCOUNTER — Encounter (INDEPENDENT_AMBULATORY_CARE_PROVIDER_SITE_OTHER): Payer: Self-pay

## 2013-04-04 ENCOUNTER — Encounter (HOSPITAL_COMMUNITY): Payer: Self-pay | Admitting: Psychiatry

## 2013-04-04 ENCOUNTER — Ambulatory Visit (INDEPENDENT_AMBULATORY_CARE_PROVIDER_SITE_OTHER): Payer: BC Managed Care – PPO | Admitting: Psychiatry

## 2013-04-04 VITALS — BP 122/74 | HR 74 | Wt 206.0 lb

## 2013-04-04 DIAGNOSIS — G4700 Insomnia, unspecified: Secondary | ICD-10-CM

## 2013-04-04 DIAGNOSIS — F1021 Alcohol dependence, in remission: Secondary | ICD-10-CM

## 2013-04-04 DIAGNOSIS — F332 Major depressive disorder, recurrent severe without psychotic features: Secondary | ICD-10-CM

## 2013-04-04 MED ORDER — LAMOTRIGINE 100 MG PO TABS: 100.0000 mg | ORAL_TABLET | Freq: Every day | ORAL | Status: DC

## 2013-04-04 MED ORDER — PAROXETINE HCL 40 MG PO TABS: 40.0000 mg | ORAL_TABLET | ORAL | Status: DC

## 2013-04-04 MED ORDER — MIRTAZAPINE 15 MG PO TABS: ORAL_TABLET | ORAL | Status: DC
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
## 2013-04-04 NOTE — Telephone Encounter (Signed)
Will see patient today

## 2013-04-11 ENCOUNTER — Telehealth (HOSPITAL_COMMUNITY): Payer: Self-pay

## 2013-04-12 MED ORDER — TRAZODONE HCL 100 MG PO TABS: ORAL_TABLET | ORAL | Status: DC

## 2013-04-12 NOTE — Telephone Encounter (Signed)
Called patient. She is not sleeping with mirtazapine, Will switch back to a higher dose of trazodone 200 mg -300 mg.

## 2013-05-02 ENCOUNTER — Other Ambulatory Visit (HOSPITAL_COMMUNITY): Payer: Self-pay | Admitting: Psychiatry

## 2013-05-16 ENCOUNTER — Other Ambulatory Visit (HOSPITAL_COMMUNITY): Payer: Self-pay | Admitting: *Deleted

## 2013-05-16 MED ORDER — MIRTAZAPINE 15 MG PO TABS: ORAL_TABLET | ORAL | Status: DC

## 2013-05-16 NOTE — Telephone Encounter (Signed)
Chart reviewed Refill appropriate Will fill for 30 days per Dr. Lucianne MussKumar

## 2013-06-12 ENCOUNTER — Other Ambulatory Visit (HOSPITAL_COMMUNITY): Payer: Self-pay | Admitting: Psychiatry

## 2013-06-12 ENCOUNTER — Ambulatory Visit (INDEPENDENT_AMBULATORY_CARE_PROVIDER_SITE_OTHER): Payer: BC Managed Care – PPO | Admitting: Psychiatry

## 2013-06-12 ENCOUNTER — Encounter (INDEPENDENT_AMBULATORY_CARE_PROVIDER_SITE_OTHER): Payer: Self-pay

## 2013-06-12 VITALS — HR 90 | Ht 65.0 in | Wt 206.0 lb

## 2013-06-12 DIAGNOSIS — F1021 Alcohol dependence, in remission: Secondary | ICD-10-CM

## 2013-06-12 DIAGNOSIS — M797 Fibromyalgia: Secondary | ICD-10-CM

## 2013-06-12 DIAGNOSIS — F332 Major depressive disorder, recurrent severe without psychotic features: Secondary | ICD-10-CM

## 2013-06-12 DIAGNOSIS — F411 Generalized anxiety disorder: Secondary | ICD-10-CM

## 2013-06-12 DIAGNOSIS — G4700 Insomnia, unspecified: Secondary | ICD-10-CM

## 2013-06-12 MED ORDER — PAROXETINE HCL 40 MG PO TABS: 40.0000 mg | ORAL_TABLET | ORAL | Status: DC

## 2013-06-12 MED ORDER — TRAZODONE HCL 100 MG PO TABS: ORAL_TABLET | ORAL | Status: DC

## 2013-06-12 MED ORDER — LAMOTRIGINE 100 MG PO TABS: 100.0000 mg | ORAL_TABLET | Freq: Every day | ORAL | Status: DC

## 2013-06-12 MED ORDER — GABAPENTIN 800 MG PO TABS
800.0000 mg | ORAL_TABLET | Freq: Two times a day (BID) | ORAL | Status: DC
Start: 2013-06-12 — End: 2013-07-03
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
## 2013-06-12 NOTE — Patient Instructions (Signed)
Take one gabapentin at night only.

## 2013-06-13 ENCOUNTER — Other Ambulatory Visit (HOSPITAL_COMMUNITY): Payer: Self-pay | Admitting: Psychiatry

## 2013-06-14 NOTE — Telephone Encounter (Signed)
error 

## 2013-06-14 NOTE — Telephone Encounter (Deleted)
error 

## 2013-06-18 ENCOUNTER — Other Ambulatory Visit: Payer: Self-pay | Admitting: Sports Medicine

## 2013-06-19 ENCOUNTER — Telehealth: Payer: Self-pay

## 2013-06-19 NOTE — Telephone Encounter (Signed)
Patient called stated that she is having bladder problems she is currently taking Myebetriq 25 mg 1 tab once daily, she wants to know if her doseage can be switched to something higher. Shalev Helminiak,CMA

## 2013-06-20 NOTE — Telephone Encounter (Signed)
Double dose to 50 mg daily. When she runs out we can give her some samples or a prescription for 50 mg tablets.

## 2013-06-20 NOTE — Telephone Encounter (Signed)
Spoke to patient gave her information as noted below. Rhonda Cunningham,CMA  

## 2013-06-21 ENCOUNTER — Ambulatory Visit: Payer: Self-pay | Admitting: Physician Assistant

## 2013-06-28 ENCOUNTER — Telehealth: Payer: Self-pay | Admitting: *Deleted

## 2013-06-28 MED ORDER — MIRABEGRON ER 50 MG PO TB24: 50.0000 mg | ORAL_TABLET | Freq: Every day | ORAL | Status: DC

## 2013-06-28 NOTE — Telephone Encounter (Signed)
Pt called and is low early on her Pryrbetriq because you told her she could take 2 of the 25mg .  She wants to know if she can take 50 when she has a flare up and 25 when she is doing all right and also needs a refill.  KG CMA

## 2013-06-28 NOTE — Telephone Encounter (Signed)
She can take 50 mg every day, I am calling in a refill, we do have discount cards if she doesn't have one already.

## 2013-07-02 ENCOUNTER — Other Ambulatory Visit: Payer: Self-pay | Admitting: Sports Medicine

## 2013-07-03 ENCOUNTER — Other Ambulatory Visit: Payer: Self-pay | Admitting: Sports Medicine

## 2013-07-10 ENCOUNTER — Other Ambulatory Visit: Payer: Self-pay

## 2013-07-10 MED ORDER — BECLOMETHASONE DIPROPIONATE 40 MCG/ACT IN AERS: 2.0000 | INHALATION_SPRAY | RESPIRATORY_TRACT | Status: DC | PRN

## 2013-07-21 ENCOUNTER — Other Ambulatory Visit: Payer: Self-pay | Admitting: Sports Medicine

## 2013-08-02 ENCOUNTER — Encounter (HOSPITAL_COMMUNITY): Payer: Self-pay

## 2013-08-22 ENCOUNTER — Ambulatory Visit (INDEPENDENT_AMBULATORY_CARE_PROVIDER_SITE_OTHER): Payer: BC Managed Care – PPO | Admitting: Psychiatry

## 2013-08-22 ENCOUNTER — Encounter (INDEPENDENT_AMBULATORY_CARE_PROVIDER_SITE_OTHER): Payer: Self-pay

## 2013-08-22 ENCOUNTER — Other Ambulatory Visit: Payer: Self-pay | Admitting: Sports Medicine

## 2013-08-22 DIAGNOSIS — F1021 Alcohol dependence, in remission: Secondary | ICD-10-CM

## 2013-08-22 DIAGNOSIS — G4700 Insomnia, unspecified: Secondary | ICD-10-CM

## 2013-08-22 DIAGNOSIS — F332 Major depressive disorder, recurrent severe without psychotic features: Secondary | ICD-10-CM

## 2013-08-22 MED ORDER — LAMOTRIGINE 100 MG PO TABS: 100.0000 mg | ORAL_TABLET | Freq: Every day | ORAL | Status: DC

## 2013-08-22 MED ORDER — TRAZODONE HCL 100 MG PO TABS: ORAL_TABLET | ORAL | Status: DC

## 2013-08-22 MED ORDER — PAROXETINE HCL 40 MG PO TABS: 40.0000 mg | ORAL_TABLET | ORAL | Status: DC
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
## 2013-09-05 ENCOUNTER — Other Ambulatory Visit: Payer: Self-pay | Admitting: Sports Medicine

## 2013-09-06 ENCOUNTER — Other Ambulatory Visit: Payer: Self-pay | Admitting: Sports Medicine

## 2013-09-12 ENCOUNTER — Ambulatory Visit (HOSPITAL_COMMUNITY): Payer: Self-pay | Admitting: Psychiatry

## 2013-10-08 ENCOUNTER — Other Ambulatory Visit: Payer: Self-pay | Admitting: Sports Medicine

## 2013-10-14 ENCOUNTER — Telehealth (HOSPITAL_COMMUNITY): Payer: Self-pay

## 2013-10-14 NOTE — Telephone Encounter (Signed)
PT needs a refill on her Trazodone. Her pharmacy is [facility] on [address]

## 2013-10-15 MED ORDER — TRAZODONE HCL 100 MG PO TABS: ORAL_TABLET | ORAL | Status: DC

## 2013-10-15 NOTE — Telephone Encounter (Signed)
Trazodone refill sent to pharmacy.

## 2013-10-24 ENCOUNTER — Ambulatory Visit (HOSPITAL_COMMUNITY): Payer: Self-pay | Admitting: Psychiatry

## 2013-10-29 ENCOUNTER — Ambulatory Visit (INDEPENDENT_AMBULATORY_CARE_PROVIDER_SITE_OTHER): Payer: BC Managed Care – PPO | Admitting: Psychiatry

## 2013-10-29 ENCOUNTER — Encounter (INDEPENDENT_AMBULATORY_CARE_PROVIDER_SITE_OTHER): Payer: Self-pay

## 2013-10-29 ENCOUNTER — Encounter (HOSPITAL_COMMUNITY): Payer: Self-pay | Admitting: Psychiatry

## 2013-10-29 VITALS — BP 135/83 | HR 80 | Ht 64.0 in | Wt 202.0 lb

## 2013-10-29 DIAGNOSIS — F411 Generalized anxiety disorder: Secondary | ICD-10-CM

## 2013-10-29 DIAGNOSIS — F332 Major depressive disorder, recurrent severe without psychotic features: Secondary | ICD-10-CM

## 2013-10-29 DIAGNOSIS — G4700 Insomnia, unspecified: Secondary | ICD-10-CM

## 2013-10-29 DIAGNOSIS — F1021 Alcohol dependence, in remission: Secondary | ICD-10-CM

## 2013-10-29 MED ORDER — GABAPENTIN 800 MG PO TABS: ORAL_TABLET | ORAL | Status: DC

## 2013-10-29 MED ORDER — LAMOTRIGINE 100 MG PO TABS: 100.0000 mg | ORAL_TABLET | Freq: Every day | ORAL | Status: DC

## 2013-10-29 MED ORDER — TRAZODONE HCL 100 MG PO TABS: ORAL_TABLET | ORAL | Status: DC

## 2013-10-29 MED ORDER — PAROXETINE HCL 40 MG PO TABS: 40.0000 mg | ORAL_TABLET | ORAL | Status: DC

## 2013-10-29 NOTE — Progress Notes (Signed)
Patient ID: Jennifer SchleinColleen M [name], female   DOB: 07/11/1960, 53 y.o.   MRN: [id number]   [facility]  Jennifer SchleinColleen M Coughlin 12/14/1960  Date:06/12/2013  HPI Comments: SUBJECTIVE: Ms. Jennifer [name] is a 53 year old female with a diagnosis of major Depressive Disorder.  The patient is referred for psychiatric services for medication management.    . Location; Patient reports she has been not having sleep issues now. It was more prominent during summer. She had difficulty using CPap and still not using it. She benefits from Trazadone.  . Quality: Patient's not feeling hopeless or helpless. . Mood wise she is doing reasonable side effects wise she has fibromyalgia that she's getting treatment for but overall she is tolerating the current medication and does not want to have any significant change   No rash or reported side effect. Keeps herself busy during the day.  She is taking gabapentin 800mg  one at night only. . Severity: Depression: 7/10 (0=Very depressed; 5=Neutral; 10=Very Happy)  Anxiety- 2/10 (0=no anxiety; 5= moderate/tolerable anxiety; 10= panic attacks)  . Duration" Since age 53.  . Timing: Mood is worse some years during Christmas if she cannot see her children.  . Context: Patient reports her main stressor has to do pain and flare-ups of fibromyalgia.  . Associated signs and symptoms : Denies any recent episodes consistent with mania, particularly decreased need for sleep with increased energy, grandiosity, impulsivity, hyperverbal and pressured speech, or increased productivity. Denies any  recent symptoms consistent with psychosis, particularly auditory or visual hallucinations, thought broadcasting/insertion/withdrawal, or ideas of reference. Also denies excessive worry to the point of physical symptoms as well as any panic attacks. Denies any history of trauma or symptoms consistent with PTSD such as flashbacks, nightmares, hypervigilance, feelings of  numbness or inability to connect with others.   Review of Systems  Constitutional: Negative for fever.  Gastrointestinal: Negative for nausea and vomiting.  Neurological: Negative for tremors and headaches.  Psychiatric/Behavioral: Negative for depression, suicidal ideas and substance abuse.   Weight 206 lbs. Height 5\' 5"  pulse 90.   Physical Exam  Vitals reviewed.  Constitutional: She appears well-developed and well-nourished. No distress.  Skin: She is not diaphoretic.  Musculoskeletal: Gait & Station: normal Patient leans: N/A   Traumatic Brain Injury: No   Past Medical History: Reviewed.  Past Medical History   Diagnosis  Date   .  Discoid lupus    .  Thyroid disease    .  Perimenopausal    .  Fibromyalgia    .  CFS (chronic fatigue syndrome)    .  Sleep apnea     History of Loss of Consciousness: Yes  Seizure History: No  Cardiac History: No  Allergies: Reviewed.  Allergies   Allergen  Reactions   .  Acetaminophen      Pt states she is unable to take this bc of Hashimoto Thyroid   .  Hydrocodone-Acetaminophen      REACTION: agitation   .  Oxycodone      Hallucinating, sweating, itching    Current Medications: Reviewed.  Current Outpatient Prescriptions on File Prior to Visit  Medication Sig Dispense Refill  . albuterol (PROVENTIL HFA;VENTOLIN HFA) 108 (90 BASE) MCG/ACT inhaler Inhale 2 puffs into the lungs every 4 (four) hours as needed for wheezing or shortness of breath.  1 each  6  . atorvastatin (LIPITOR) 40 MG tablet Take 1 tablet (40 mg total) by mouth daily.  90 tablet  3  .  fluticasone (FLONASE) 50 MCG/ACT nasal spray USE 1 SPRAY IN EACH NOSTRIL TWICE DAILY.**USE LEFT HAND FOR RIGHT NOSTRIL AND RIGHT HAND FOR LEFT NOSTRIL**  16 g  11  . folic acid (FOLVITE) 1 MG tablet Take 1 mg by mouth daily.        Marland Kitchen levothyroxine (SYNTHROID, LEVOTHROID) 150 MCG tablet TAKE 1 TABLET BY MOUTH DAILY  30 tablet  0  . mirabegron ER (MYRBETRIQ) 50 MG TB24 tablet Take 1  tablet (50 mg total) by mouth daily.  30 tablet  11  . mirtazapine (REMERON) 15 MG tablet Take one a half (22.5 mg) to two tablets (30 mg)  at bedtime for insomnia.  60 tablet  0  . mometasone-formoterol (DULERA) 200-5 MCG/ACT AERO Inhale 2 puffs into the lungs 2 (two) times daily.  2 Inhaler  0  . montelukast (SINGULAIR) 10 MG tablet Take 1 tablet (10 mg total) by mouth at bedtime.  90 tablet  3  . QVAR 40 MCG/ACT inhaler INHALE 2 PUFFS INTO THE LUNGS AS NEEDED  8.7 g  0  . Tapentadol HCl (NUCYNTA) 75 MG TABS Take 50 mg by mouth every 4 (four) hours as needed.       . traMADol (ULTRAM) 50 MG tablet 2 tabs by mouth Q8 hours, maximum 6 tabs per day.  90 tablet  0  . Vitamin D, Ergocalciferol, (DRISDOL) 50000 UNITS CAPS TAKE 1 CAPSULE BY MOUTH EVERY 7 DAYS .  8 capsule  0  . [DISCONTINUED] mometasone (NASONEX) 50 MCG/ACT nasal spray Place 2 sprays into the nose daily.  17 g  12  . [DISCONTINUED] omeprazole (PRILOSEC) 20 MG capsule       . [DISCONTINUED] oxybutynin (DITROPAN-XL) 5 MG 24 hr tablet Take 1 tablet (5 mg total) by mouth daily.  30 tablet  3  . [DISCONTINUED] sucralfate (CARAFATE) 1 G tablet Take 2 tablets (2 g total) by mouth 2 (two) times daily before a meal. preferbly an hour before eating  120 tablet  6   No current facility-administered medications on file prior to visit.   Substance Abuse History in the last 12 months:  History   Social History  . Marital Status: Married    Spouse Name: N/A    Number of Children: 4  . Years of Education: N/A   Occupational History  .     Social History Main Topics  . Smoking status: Former Smoker -- 0.30 packs/day for 36 years    Types: Cigarettes  . Smokeless tobacco: Never Used     Comment: Started smoking at age 42.  Marland Kitchen Alcohol Use: No     Comment: quit 01-2006/prior alcoholic-in AA  . Drug Use: No  . Sexual Activity: No   Other Topics Concern  . None   Social History Narrative  . None     Blackouts: Yes  DT's: No   Withdrawal Symptoms: Yes     Family History: Reviewed.  Family History   Problem  Relation  Age of Onset   .  Hypertension  Father    .  Heart disease  Sister  45      heart attack    .  Depression  Brother    .  Cancer  Mother  84      colon    Psychiatric Specialty Exam: General Appearance: Fairly Groomed  Eye Contact::  Good  Speech:  Clear and Coherent and Normal Rate  Volume:  Normal  Mood:  "good"  Affect:  Appropriate  Thought Process:  Loose  Orientation:  Full (Time, Place, and Person)  Thought Content:  WDL  Suicidal Thoughts:  No  Homicidal Thoughts:  No  Memory:  Immediate;   Good Recent;   Good Remote;   Good  Judgement:  Good  Insight:  Fair  Psychomotor Activity:  Normal  Concentration:  Fair  Recall:  Poor  Akathisia:  No  Handed:  Right  Fund of knowledge-average to above average  Language-Intact  AIMS (if indicated):     Assets:  Manufacturing systems engineerCommunication Skills Desire for Improvement Financial Resources/Insurance Housing Intimacy Leisure Time Physical Health Resilience Social Support Talents/Skills Transportation Vocational/Educational  Language intact   Fund of knowledge is good      Assessment:  Major Depression, Recurrent severe-stable Alcohol Dependence in full sustained remission-stable AXIS I  Major Depression, Recurrent severe, 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 Affirm with the patient that the medications are taken as ordered. Patient expressed understanding of how their medications were to be used.  2. Continue the following psychiatric medications as written prior to this appointment with the following changes:  Continue current prescription of Paxil, lamictal and trazadone. Discussed sleep hygiene. He sleep has improved says it is worse in summer. 3. Therapy: brief supportive therapy provided. Discussed psychosocial stressors. Continue current services. More than 50% of the  visit was spent on individual therapy/counseling. 4. Risks and benefits, side effects and alternatives discussed with patient, she was given an opportunity to ask questions about his/her medication, illness, and treatment. All current psychiatric medications have been reviewed and discussed with the patient and adjusted as clinically appropriate. The patient has been provided an accurate and updated list of the medications being now prescribed.  5. Patient told to call clinic if any problems occur. Patient advised to go to ER if she should develop SI/HI, side effects, or if symptoms worsen. Has crisis numbers to call if needed.  6. No labs warranted at this time.  7. The patient was encouraged to keep all PCP and specialty clinic appointments.  8. Patient was instructed to return to clinic in 2-3 month.  9.The patient expressed understanding of the above and agrees with the plan.  10.follow up 3 months.   Time spent: 25 minutes Thresa RossAKHTAR, Finnick Orosz, MD

## 2013-11-10 ENCOUNTER — Other Ambulatory Visit: Payer: Self-pay | Admitting: Sports Medicine

## 2013-11-14 ENCOUNTER — Ambulatory Visit (INDEPENDENT_AMBULATORY_CARE_PROVIDER_SITE_OTHER): Payer: BC Managed Care – PPO

## 2013-11-14 ENCOUNTER — Ambulatory Visit (INDEPENDENT_AMBULATORY_CARE_PROVIDER_SITE_OTHER): Payer: BC Managed Care – PPO | Admitting: Sports Medicine

## 2013-11-14 ENCOUNTER — Encounter: Payer: Self-pay | Admitting: Sports Medicine

## 2013-11-14 VITALS — BP 126/88 | HR 80 | Ht 65.0 in | Wt 201.0 lb

## 2013-11-14 DIAGNOSIS — M5441 Lumbago with sciatica, right side: Secondary | ICD-10-CM | POA: Diagnosis not present

## 2013-11-14 DIAGNOSIS — R05 Cough: Secondary | ICD-10-CM

## 2013-11-14 DIAGNOSIS — R059 Cough, unspecified: Secondary | ICD-10-CM

## 2013-11-14 DIAGNOSIS — M5442 Lumbago with sciatica, left side: Secondary | ICD-10-CM | POA: Diagnosis not present

## 2013-11-14 DIAGNOSIS — J449 Chronic obstructive pulmonary disease, unspecified: Secondary | ICD-10-CM

## 2013-11-14 DIAGNOSIS — J209 Acute bronchitis, unspecified: Secondary | ICD-10-CM | POA: Insufficient documentation

## 2013-11-14 MED ORDER — AZITHROMYCIN 250 MG PO TABS: ORAL_TABLET | ORAL | Status: DC

## 2013-11-14 MED ORDER — DEXAMETHASONE 4 MG PO TABS: 4.0000 mg | ORAL_TABLET | Freq: Two times a day (BID) | ORAL | Status: DC

## 2013-11-14 NOTE — Assessment & Plan Note (Signed)
Cough with persistent upper sinus symptoms and lower respiratory symptoms. Abnormal lung sounds on the right with a pleural rub. Chest x-ray, azithromycin, Decadron. Return if no better in 2 weeks.

## 2013-11-14 NOTE — Assessment & Plan Note (Signed)
Handicap placard today, patient needs to be as active as possible, there is certainly a myofascial component to her pain,and decreasing her activity level would be counterproductive. She was extremely tearful in the office today when I declined her handicap sticker.

## 2013-11-14 NOTE — Progress Notes (Signed)
  Subjective:    CC: ER follow-up  HPI: 2 days ago this 53 year old female with a history of depression noted her blood pressure to be approximately 140/110 at home. She developed a throbbing headache with nausea. She had no visual changes, or chest pain at the time. She went to the emergency department where her blood pressure was measured to be relatively normal and her headache was abating.  CT scan of the head was negative and she was encouraged to follow-up with me. She did not use any decongestants but does endorse prolonged cough, nonproductive, and sinus pressure. Symptoms are moderate, persistent. No leg swelling, no shortness of breath. No chest pain.  Patient asked me for a handicapped renewal, this will be declined, patient needs as much physical activity is possible. I offered a temporary permanent while she was sick however she declines.  Past medical history, Surgical history, Family history not pertinant except as noted below, Social history, Allergies, and medications have been entered into the medical record, reviewed, and no changes needed.   Review of Systems: No fevers, chills, night sweats, weight loss, chest pain, or shortness of breath.   Objective:    General: Well Developed, well nourished, and in no acute distress.  Neuro: Alert and oriented x3, extra-ocular muscles intact, sensation grossly intact.  HEENT: Normocephalic, atraumatic, pupils equal round reactive to light, neck supple, no masses, no lymphadenopathy, thyroid nonpalpable. Oropharynx, nasopharynx, external ear canals are unremarkable. Skin: Warm and dry, no rashes. Cardiac: Regular rate and rhythm, no murmurs rubs or gallops, no lower extremity edema.  Respiratory: Coarse sounds in the entire right lung field. Not using accessory muscles, speaking in full sentences.  Impression and Recommendations:

## 2013-11-17 ENCOUNTER — Other Ambulatory Visit: Payer: Self-pay | Admitting: Sports Medicine

## 2013-11-22 ENCOUNTER — Telehealth: Payer: Self-pay

## 2013-11-22 DIAGNOSIS — M7918 Myalgia, other site: Secondary | ICD-10-CM

## 2013-11-22 NOTE — Telephone Encounter (Signed)
Patient called and request a referral to Pain Management clinic in the [place].Bjorn Loser. Daysha Ashmore,CMA

## 2013-11-22 NOTE — Telephone Encounter (Signed)
Referral placed.

## 2013-12-02 ENCOUNTER — Encounter: Payer: Self-pay | Admitting: Sports Medicine

## 2013-12-02 ENCOUNTER — Ambulatory Visit (INDEPENDENT_AMBULATORY_CARE_PROVIDER_SITE_OTHER): Payer: BC Managed Care – PPO | Admitting: Sports Medicine

## 2013-12-02 DIAGNOSIS — J209 Acute bronchitis, unspecified: Secondary | ICD-10-CM

## 2013-12-02 DIAGNOSIS — M797 Fibromyalgia: Secondary | ICD-10-CM | POA: Diagnosis not present

## 2013-12-02 MED ORDER — DOXYCYCLINE HYCLATE 100 MG PO TABS: 100.0000 mg | ORAL_TABLET | Freq: Two times a day (BID) | ORAL | Status: AC

## 2013-12-02 MED ORDER — DIAZEPAM 5 MG PO TABS: 5.0000 mg | ORAL_TABLET | Freq: Three times a day (TID) | ORAL | Status: DC | PRN

## 2013-12-02 MED ORDER — DM-GUAIFENESIN ER 30-600 MG PO TB12: 1.0000 | ORAL_TABLET | Freq: Two times a day (BID) | ORAL | Status: DC

## 2013-12-02 NOTE — Assessment & Plan Note (Addendum)
Improved significantly. She is having some mild chest discomfort. Doxycycline, d-dimer. Mucinex DM

## 2013-12-02 NOTE — Assessment & Plan Note (Signed)
Worsening symptoms, anxiety. Short course of Valium. Some of this is likely related to the Decadron.

## 2013-12-02 NOTE — Progress Notes (Signed)
  Subjective:    CC: Follow-up  HPI: Acute bronchitis: Symptoms have improved significantly, this was with Decadron and azithromycin, unfortunately has persistent cough which is expected, and mild chest discomfort. Chest discomfort is not pleuritic and she denies any shortness of breath. No leg swelling, no other factors that would predispose her to a blood clot.  Fibromyalgia: Persistent despite current medications, she is getting a small tremor, and she does tell me that she is worried, she was fired by her previous pain management doctor, and it is unable to get a refill on her Nucynta. She does have a visit, With another pain management Center.  Past medical history, Surgical history, Family history not pertinant except as noted below, Social history, Allergies, and medications have been entered into the medical record, reviewed, and no changes needed.   Review of Systems: No fevers, chills, night sweats, weight loss, chest pain, or shortness of breath.   Objective:    General: Well Developed, well nourished, and in no acute distress.  Neuro: Alert and oriented x3, extra-ocular muscles intact, sensation grossly intact.  HEENT: Normocephalic, atraumatic, pupils equal round reactive to light, neck supple, no masses, no lymphadenopathy, thyroid nonpalpable.  Skin: Warm and dry, no rashes. Cardiac: Regular rate and rhythm, no murmurs rubs or gallops, no lower extremity edema. Negative Homans sign bilaterally. Respiratory: Clear to auscultation bilaterally. Not using accessory muscles, speaking in full sentences.  Impression and Recommendations:

## 2013-12-03 LAB — D-DIMER, QUANTITATIVE: D-Dimer, Quant: 0.27 ug/mL-FEU (ref 0.00–0.48)

## 2013-12-11 ENCOUNTER — Other Ambulatory Visit: Payer: Self-pay | Admitting: Sports Medicine

## 2013-12-19 ENCOUNTER — Other Ambulatory Visit: Payer: Self-pay | Admitting: Sports Medicine

## 2014-01-12 ENCOUNTER — Other Ambulatory Visit: Payer: Self-pay | Admitting: Sports Medicine

## 2014-01-14 ENCOUNTER — Ambulatory Visit: Payer: Self-pay | Admitting: Sports Medicine

## 2014-01-15 ENCOUNTER — Other Ambulatory Visit: Payer: Self-pay | Admitting: Sports Medicine

## 2014-01-16 ENCOUNTER — Ambulatory Visit (INDEPENDENT_AMBULATORY_CARE_PROVIDER_SITE_OTHER): Payer: BLUE CROSS/BLUE SHIELD | Admitting: Sports Medicine

## 2014-01-16 ENCOUNTER — Encounter: Payer: Self-pay | Admitting: Sports Medicine

## 2014-01-16 VITALS — BP 124/83 | HR 78 | Ht 65.0 in | Wt 201.0 lb

## 2014-01-16 DIAGNOSIS — E038 Other specified hypothyroidism: Secondary | ICD-10-CM | POA: Diagnosis not present

## 2014-01-16 DIAGNOSIS — E034 Atrophy of thyroid (acquired): Secondary | ICD-10-CM | POA: Diagnosis not present

## 2014-01-16 DIAGNOSIS — K582 Mixed irritable bowel syndrome: Secondary | ICD-10-CM | POA: Insufficient documentation

## 2014-01-16 DIAGNOSIS — R1032 Left lower quadrant pain: Secondary | ICD-10-CM | POA: Diagnosis not present

## 2014-01-16 DIAGNOSIS — K581 Irritable bowel syndrome with constipation: Secondary | ICD-10-CM | POA: Insufficient documentation

## 2014-01-16 NOTE — Progress Notes (Signed)
  Subjective:    CC: Follow-up  HPI: Abdominal pain: Left lower quadrant, noted at her pain management office, present for some time now. She does have significant constipation with straining with stooling. She is also on chronic narcotics. Symptoms are moderate, persistent, no hematochezia, melena, hematemesis. No constitutional symptoms. No urinary symptoms.  Hypocalcemia: Noted on a recent blood panel.  Hypothyroidism: TSH was elevated on recent panel, however she is only taking levothyroxin one half tablet on the weekends, and a full tablet other days. Does feel somewhat sluggish.  Past medical history, Surgical history, Family history not pertinant except as noted below, Social history, Allergies, and medications have been entered into the medical record, reviewed, and no changes needed.   Review of Systems: No fevers, chills, night sweats, weight loss, chest pain, or shortness of breath.   Objective:    General: Well Developed, well nourished, and in no acute distress.  Neuro: Alert and oriented x3, extra-ocular muscles intact, sensation grossly intact.  HEENT: Normocephalic, atraumatic, pupils equal round reactive to light, neck supple, no masses, no lymphadenopathy, thyroid nonpalpable.  Skin: Warm and dry, no rashes. Cardiac: Regular rate and rhythm, no murmurs rubs or gallops, no lower extremity edema.  Respiratory: Clear to auscultation bilaterally. Not using accessory muscles, speaking in full sentences. Abdomen: Soft, tender to palpation in the left lower quadrant and suprapubic region, nondistended, normal bowel sounds, no palpable masses, no guarding or rebound pain. No costovertebral angle pain.  Impression and Recommendations:

## 2014-01-16 NOTE — Assessment & Plan Note (Signed)
Likely related to constipation versus diverticulitis. Samples of Movantik given, she is on chronic narcotics. CT scan of the abdomen and pelvis with oral and 90 contrast. Blood work, urinalysis.  Return to see me in one week.

## 2014-01-16 NOTE — Assessment & Plan Note (Signed)
TSH did appear high at another physician's office. She is doing one half of levothyroxine tablet on the weekends, advised to go back to a full tablet every day. We can recheck in 6 weeks.

## 2014-01-17 ENCOUNTER — Telehealth: Payer: Self-pay | Admitting: *Deleted

## 2014-01-17 LAB — COMPREHENSIVE METABOLIC PANEL WITH GFR
ALT: 22 U/L (ref 0–35)
AST: 19 U/L (ref 0–37)
Albumin: 4.6 g/dL (ref 3.5–5.2)
BUN: 13 mg/dL (ref 6–23)
CO2: 27 meq/L (ref 19–32)
Chloride: 103 meq/L (ref 96–112)
Total Bilirubin: 0.9 mg/dL (ref 0.2–1.2)
Total Protein: 7.1 g/dL (ref 6.0–8.3)

## 2014-01-17 LAB — COMPREHENSIVE METABOLIC PANEL
Alkaline Phosphatase: 44 U/L (ref 39–117)
Calcium: 9.8 mg/dL (ref 8.4–10.5)
Creat: 1 mg/dL (ref 0.50–1.10)
Glucose, Bld: 91 mg/dL (ref 70–99)
Potassium: 4.1 mEq/L (ref 3.5–5.3)
Sodium: 139 mEq/L (ref 135–145)

## 2014-01-17 LAB — CBC WITH DIFFERENTIAL/PLATELET
Basophils Absolute: 0 K/uL (ref 0.0–0.1)
Basophils Relative: 1 % (ref 0–1)
Eosinophils Absolute: 0 10*3/uL (ref 0.0–0.7)
Eosinophils Relative: 0 % (ref 0–5)
HCT: 44.6 % (ref 36.0–46.0)
Hemoglobin: 15.3 g/dL — ABNORMAL HIGH (ref 12.0–15.0)
Lymphocytes Relative: 32 % (ref 12–46)
Lymphs Abs: 1.4 K/uL (ref 0.7–4.0)
MCH: 31.3 pg (ref 26.0–34.0)
MCHC: 34.3 g/dL (ref 30.0–36.0)
MCV: 91.2 fL (ref 78.0–100.0)
MPV: 9.5 fL (ref 8.6–12.4)
Monocytes Absolute: 0.4 10*3/uL (ref 0.1–1.0)
Monocytes Relative: 8 % (ref 3–12)
Neutro Abs: 2.6 10*3/uL (ref 1.7–7.7)
Neutrophils Relative %: 59 % (ref 43–77)
Platelets: 173 10*3/uL (ref 150–400)
RBC: 4.89 MIL/uL (ref 3.87–5.11)
RDW: 13 % (ref 11.5–15.5)
WBC: 4.4 10*3/uL (ref 4.0–10.5)

## 2014-01-17 LAB — URINALYSIS
Bilirubin Urine: NEGATIVE
Glucose, UA: NEGATIVE mg/dL
Hgb urine dipstick: NEGATIVE
Leukocytes, UA: NEGATIVE
Nitrite: NEGATIVE
Protein, ur: NEGATIVE mg/dL
Specific Gravity, Urine: 1.025 (ref 1.005–1.030)
Urobilinogen, UA: 0.2 mg/dL (ref 0.0–1.0)
pH: 5 (ref 5.0–8.0)

## 2014-01-17 LAB — AMYLASE: Amylase: 45 U/L (ref 0–105)

## 2014-01-17 LAB — LIPASE: Lipase: 25 U/L (ref 0–75)

## 2014-01-17 NOTE — Telephone Encounter (Signed)
CT approval abd/pelvis with contrast [id number] valid 01/17/14-02/15/14. Faxed to radiology and called.

## 2014-01-21 ENCOUNTER — Ambulatory Visit (INDEPENDENT_AMBULATORY_CARE_PROVIDER_SITE_OTHER): Payer: BLUE CROSS/BLUE SHIELD

## 2014-01-21 DIAGNOSIS — R1032 Left lower quadrant pain: Secondary | ICD-10-CM

## 2014-01-21 DIAGNOSIS — K5790 Diverticulosis of intestine, part unspecified, without perforation or abscess without bleeding: Secondary | ICD-10-CM

## 2014-01-21 MED ORDER — IOHEXOL 300 MG/ML  SOLN
100.0000 mL | Freq: Once | INTRAMUSCULAR | Status: AC | PRN
Start: 2014-01-21 — End: 2014-01-21
  Administered 2014-01-21: 100 mL via INTRAVENOUS

## 2014-01-23 ENCOUNTER — Encounter: Payer: Self-pay | Admitting: Sports Medicine

## 2014-01-23 ENCOUNTER — Ambulatory Visit (INDEPENDENT_AMBULATORY_CARE_PROVIDER_SITE_OTHER): Payer: BLUE CROSS/BLUE SHIELD | Admitting: Sports Medicine

## 2014-01-23 DIAGNOSIS — K589 Irritable bowel syndrome without diarrhea: Secondary | ICD-10-CM | POA: Diagnosis not present

## 2014-01-23 DIAGNOSIS — K581 Irritable bowel syndrome with constipation: Secondary | ICD-10-CM

## 2014-01-23 MED ORDER — LINACLOTIDE 145 MCG PO CAPS: ORAL_CAPSULE | ORAL | Status: DC

## 2014-01-23 NOTE — Assessment & Plan Note (Signed)
No response to Movantik in this patient on chronic narcotics.  CT scan was negative, blood work was negative. Weight has been stable suggesting that stooling is appropriate and constipation essentially functional. 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  We are going to give her a free trial offer of Linzess. Return in 1 month to re-eval IBS.

## 2014-01-23 NOTE — Progress Notes (Signed)
  Subjective:    CC: Follow-up  HPI: Jill SideColleen tells me her abdominal pain has resolved, her CT scan was negative and all blood work was negative. She continues to be constipated however tells me she stools couple of times per day. She denies a significant urges to stool after eating, she does take a daily narcotic, and Movantik.  Past medical history, Surgical history, Family history not pertinant except as noted below, Social history, Allergies, and medications have been entered into the medical record, reviewed, and no changes needed.   Review of Systems: No fevers, chills, night sweats, weight loss, chest pain, or shortness of breath.   Objective:    General: Well Developed, well nourished, and in no acute distress.  Neuro: Alert and oriented x3, extra-ocular muscles intact, sensation grossly intact.  HEENT: Normocephalic, atraumatic, pupils equal round reactive to light, neck supple, no masses, no lymphadenopathy, thyroid nonpalpable.  Skin: Warm and dry, no rashes. Cardiac: Regular rate and rhythm, no murmurs rubs or gallops, no lower extremity edema.  Respiratory: Clear to auscultation bilaterally. Not using accessory muscles, speaking in full sentences.  Impression and Recommendations:

## 2014-01-23 NOTE — Patient Instructions (Signed)
Diet and Irritable Bowel Syndrome  No cure has been found for irritable bowel syndrome (IBS). Many options are available to treat the symptoms. Your caregiver will give you the best treatments available for your symptoms. He or she will also encourage you to manage stress and to make changes to your diet. You need to work with your caregiver and Registered Dietician to find the best combination of medicine, diet, counseling, and support to control your symptoms. The following are some diet suggestions. FOODS THAT MAKE IBS WORSE  Fatty foods, such as French fries.  Milk products, such as cheese or ice cream.  Chocolate.  Alcohol.  Caffeine (found in coffee and some sodas).  Carbonated drinks, such as soda. If certain foods cause symptoms, you should eat less of them or stop eating them. FOOD JOURNAL   Keep a journal of the foods that seem to cause distress. Write down:  What you are eating during the day and when.  What problems you are having after eating.  When the symptoms occur in relation to your meals.  What foods always make you feel badly.  Take your notes with you to your caregiver to see if you should stop eating certain foods. FOODS THAT MAKE IBS BETTER Fiber reduces IBS symptoms, especially constipation, because it makes stools soft, bulky, and easier to pass. Fiber is found in bran, bread, cereal, beans, fruit, and vegetables. Examples of foods with fiber include:  Apples.  Peaches.  Pears.  Berries.  Figs.  Broccoli, raw.  Cabbage.  Carrots.  Raw peas.  Kidney beans.  Lima beans.  Whole-grain bread.  Whole-grain cereal. Add foods with fiber to your diet a little at a time. This will let your body get used to them. Too much fiber at once might cause gas and swelling of your abdomen. This can trigger symptoms in a person with IBS. Caregivers usually recommend a diet with enough fiber to produce soft, painless bowel movements. High fiber diets may  cause gas and bloating. However, these symptoms often go away within a few weeks, as your body adjusts. In many cases, dietary fiber may lessen IBS symptoms, particularly constipation. However, it may not help pain or diarrhea. High fiber diets keep the colon mildly enlarged (distended) with the added fiber. This may help prevent spasms in the colon. Some forms of fiber also keep water in the stool, thereby preventing hard stools that are difficult to pass.  Besides telling you to eat more foods with fiber, your caregiver may also tell you to get more fiber by taking a fiber pill or drinking water mixed with a special high fiber powder. An example of this is a natural fiber laxative containing psyllium seed.  TIPS  Large meals can cause cramping and diarrhea in people with IBS. If this happens to you, try eating 4 or 5 small meals a day, or try eating less at each of your usual 3 meals. It may also help if your meals are low in fat and high in carbohydrates. Examples of carbohydrates are pasta, rice, whole-grain breads and cereals, fruits, and vegetables.  If dairy products cause your symptoms to flare up, you can try eating less of those foods. You might be able to handle yogurt better than other dairy products, because it contains bacteria that helps with digestion. Dairy products are an important source of calcium and other nutrients. If you need to avoid dairy products, be sure to talk with a Registered Dietitian about getting these nutrients   through other food sources.  Drink enough water and fluids to keep your urine clear or pale yellow. This is important, especially if you have diarrhea. FOR MORE INFORMATION  International Foundation for Functional Gastrointestinal Disorders: www.iffgd.org  National Digestive Diseases Information Clearinghouse: digestive.niddk.nih.gov Document Released: 03/12/2003 Document Revised: 03/14/2011 Document Reviewed: 03/22/2013 Exit[employer] Patient Information 2015  [employer]. This information is not intended to replace advice given to you by your health care provider. Make sure you discuss any questions you have with your health care provider.  

## 2014-01-27 ENCOUNTER — Other Ambulatory Visit: Payer: Self-pay | Admitting: Sports Medicine

## 2014-01-29 ENCOUNTER — Other Ambulatory Visit: Payer: Self-pay

## 2014-01-29 MED ORDER — LEVOTHYROXINE SODIUM 150 MCG PO TABS: 150.0000 ug | ORAL_TABLET | Freq: Every day | ORAL | Status: DC

## 2014-02-20 ENCOUNTER — Other Ambulatory Visit (HOSPITAL_COMMUNITY): Payer: Self-pay | Admitting: Psychiatry

## 2014-02-26 ENCOUNTER — Other Ambulatory Visit: Payer: Self-pay | Admitting: Sports Medicine

## 2014-03-31 ENCOUNTER — Other Ambulatory Visit: Payer: Self-pay | Admitting: Sports Medicine

## 2014-04-21 ENCOUNTER — Other Ambulatory Visit (HOSPITAL_COMMUNITY): Payer: Self-pay | Admitting: Psychiatry

## 2014-04-22 ENCOUNTER — Other Ambulatory Visit: Payer: Self-pay | Admitting: Sports Medicine

## 2014-04-28 ENCOUNTER — Other Ambulatory Visit: Payer: Self-pay | Admitting: Sports Medicine

## 2014-05-08 ENCOUNTER — Ambulatory Visit (INDEPENDENT_AMBULATORY_CARE_PROVIDER_SITE_OTHER): Payer: BLUE CROSS/BLUE SHIELD | Admitting: Medical

## 2014-05-08 ENCOUNTER — Emergency Department (HOSPITAL_BASED_OUTPATIENT_CLINIC_OR_DEPARTMENT_OTHER)
Admission: EM | Admit: 2014-05-08 | Discharge: 2014-05-08 | Disposition: A | Discharge status: Home / Self Care | Payer: BLUE CROSS/BLUE SHIELD | Attending: Emergency Medicine | Admitting: Emergency Medicine

## 2014-05-08 ENCOUNTER — Emergency Department (HOSPITAL_BASED_OUTPATIENT_CLINIC_OR_DEPARTMENT_OTHER): Payer: BLUE CROSS/BLUE SHIELD

## 2014-05-08 ENCOUNTER — Encounter (HOSPITAL_BASED_OUTPATIENT_CLINIC_OR_DEPARTMENT_OTHER): Payer: Self-pay | Admitting: *Deleted

## 2014-05-08 ENCOUNTER — Encounter: Payer: Self-pay | Admitting: Medical

## 2014-05-08 VITALS — BP 139/84 | HR 80 | Temp 98.1°F | Ht 65.0 in | Wt 210.8 lb

## 2014-05-08 DIAGNOSIS — R0602 Shortness of breath: Secondary | ICD-10-CM | POA: Insufficient documentation

## 2014-05-08 DIAGNOSIS — J441 Chronic obstructive pulmonary disease with (acute) exacerbation: Secondary | ICD-10-CM | POA: Diagnosis not present

## 2014-05-08 DIAGNOSIS — Z872 Personal history of diseases of the skin and subcutaneous tissue: Secondary | ICD-10-CM | POA: Diagnosis not present

## 2014-05-08 DIAGNOSIS — Z8719 Personal history of other diseases of the digestive system: Secondary | ICD-10-CM | POA: Insufficient documentation

## 2014-05-08 DIAGNOSIS — R42 Dizziness and giddiness: Secondary | ICD-10-CM | POA: Diagnosis not present

## 2014-05-08 DIAGNOSIS — J449 Chronic obstructive pulmonary disease, unspecified: Secondary | ICD-10-CM

## 2014-05-08 DIAGNOSIS — Z79899 Other long term (current) drug therapy: Secondary | ICD-10-CM | POA: Diagnosis not present

## 2014-05-08 DIAGNOSIS — Z87448 Personal history of other diseases of urinary system: Secondary | ICD-10-CM | POA: Insufficient documentation

## 2014-05-08 DIAGNOSIS — R0789 Other chest pain: Secondary | ICD-10-CM

## 2014-05-08 DIAGNOSIS — R079 Chest pain, unspecified: Secondary | ICD-10-CM | POA: Diagnosis present

## 2014-05-08 DIAGNOSIS — Z87891 Personal history of nicotine dependence: Secondary | ICD-10-CM | POA: Diagnosis not present

## 2014-05-08 DIAGNOSIS — E785 Hyperlipidemia, unspecified: Secondary | ICD-10-CM | POA: Insufficient documentation

## 2014-05-08 DIAGNOSIS — Z8669 Personal history of other diseases of the nervous system and sense organs: Secondary | ICD-10-CM | POA: Insufficient documentation

## 2014-05-08 DIAGNOSIS — M791 Myalgia: Secondary | ICD-10-CM | POA: Diagnosis not present

## 2014-05-08 DIAGNOSIS — E079 Disorder of thyroid, unspecified: Secondary | ICD-10-CM | POA: Insufficient documentation

## 2014-05-08 LAB — CBC WITH DIFFERENTIAL/PLATELET
BASOS ABS: 0.1 10*3/uL (ref 0.0–0.1)
Basophils Relative: 1 % (ref 0–1)
Eosinophils Absolute: 0 10*3/uL (ref 0.0–0.7)
Eosinophils Relative: 1 % (ref 0–5)
HCT: 38.3 % (ref 36.0–46.0)
HEMOGLOBIN: 13.3 g/dL (ref 12.0–15.0)
Lymphocytes Relative: 40 % (ref 12–46)
Lymphs Abs: 1.8 10*3/uL (ref 0.7–4.0)
MCH: 31.6 pg (ref 26.0–34.0)
MCHC: 34.7 g/dL (ref 30.0–36.0)
MCV: 91 fL (ref 78.0–100.0)
Monocytes Absolute: 0.3 10*3/uL (ref 0.1–1.0)
Monocytes Relative: 6 % (ref 3–12)
NEUTROS ABS: 2.3 10*3/uL (ref 1.7–7.7)
NEUTROS PCT: 52 % (ref 43–77)
Platelets: 154 10*3/uL (ref 150–400)
RBC: 4.21 MIL/uL (ref 3.87–5.11)
RDW: 13.1 % (ref 11.5–15.5)
WBC: 4.4 10*3/uL (ref 4.0–10.5)

## 2014-05-08 LAB — TROPONIN I: Troponin I: <0.03 ng/mL (ref <0.031)

## 2014-05-08 LAB — BASIC METABOLIC PANEL
ANION GAP: 9 (ref 5–15)
BUN: 12 mg/dL (ref 6–20)
CALCIUM: 8.7 mg/dL — AB (ref 8.9–10.3)
CO2: 26 mmol/L (ref 22–32)
CREATININE: 0.86 mg/dL (ref 0.44–1.00)
Chloride: 104 mmol/L (ref 101–111)
GFR calc Af Amer: >60 mL/min (ref >60)
GFR calc non Af Amer: >60 mL/min (ref >60)
GLUCOSE: 106 mg/dL — AB (ref 70–99)
Potassium: 3.6 mmol/L (ref 3.5–5.1)
Sodium: 139 mmol/L (ref 135–145)

## 2014-05-08 MED ORDER — ALBUTEROL SULFATE HFA 108 (90 BASE) MCG/ACT IN AERS
2.0000 | INHALATION_SPRAY | Freq: Four times a day (QID) | RESPIRATORY_TRACT | Status: DC
  Administered 2014-05-08: 2 via RESPIRATORY_TRACT
  Filled 2014-05-08 (×2): qty 6.7

## 2014-05-08 MED ORDER — PREDNISONE 10 MG PO TABS: 40.0000 mg | ORAL_TABLET | Freq: Every day | ORAL | Status: DC

## 2014-05-08 MED ORDER — IPRATROPIUM-ALBUTEROL 0.5-2.5 (3) MG/3ML IN SOLN
3.0000 mL | RESPIRATORY_TRACT | Status: DC
  Administered 2014-05-08: 3 mL via RESPIRATORY_TRACT
  Filled 2014-05-08: qty 3

## 2014-05-08 MED ORDER — PREDNISONE 50 MG PO TABS
60.0000 mg | ORAL_TABLET | Freq: Once | ORAL | Status: AC
  Administered 2014-05-08: 60 mg via ORAL
  Filled 2014-05-08 (×2): qty 1

## 2014-05-08 NOTE — ED Notes (Signed)
Chest tightness. She was sent to be evaluated from her MD's office after having sob unrelieved by Albuterol nebulizer.

## 2014-05-08 NOTE — Patient Instructions (Addendum)
Shortness of breath /tight chest feeling. Duo neb tx. Post neb tx dizziness. But no tachycardia. Tight chest  sensation did not clear up post neb. Ekg appeared nsr. Pt sister heart attack at age 56.   Pt woud likely benfit stat cxr, troponin and d-dimer.  Follow up as need post ED eval.

## 2014-05-08 NOTE — Assessment & Plan Note (Addendum)
/  tight chest feeling. Duo neb tx. Post neb tx dizziness. But no tachycardia. Tight chest  sensation did not clear up post neb. Ekg appeared nsr. Pt sister heart attack at age 56.   Pt woud likely benfit stat cxr, troponin and d-dimer.  Follow up as need post ED eval.

## 2014-05-08 NOTE — Discharge Instructions (Signed)
Asthma °Asthma is a condition of the lungs in which the airways tighten and narrow. Asthma can make it hard to breathe. Asthma cannot be cured, but medicine and lifestyle changes can help control it. Asthma may be started (triggered) by: °· Animal skin flakes (dander). °· Dust. °· Cockroaches. °· Pollen. °· Mold. °· Smoke. °· Cleaning products. °· Hair sprays or aerosol sprays. °· Paint fumes or strong smells. °· Cold air, weather changes, and winds. °· Crying or laughing hard. °· Stress. °· Certain medicines or drugs. °· Foods, such as dried fruit, potato chips, and sparkling grape juice. °· Infections or conditions (colds, flu). °· Exercise. °· Certain medical conditions or diseases. °· Exercise or tiring activities. °HOME CARE  °· Take medicine as told by your doctor. °· Use a peak flow meter as told by your doctor. A peak flow meter is a tool that measures how well the lungs are working. °· Record and keep track of the peak flow meter's readings. °· Understand and use the asthma action plan. An asthma action plan is a written plan for taking care of your asthma and treating your attacks. °· To help prevent asthma attacks: °¨ Do not smoke. Stay away from secondhand smoke. °¨ Change your heating and air conditioning filter often. °¨ Limit your use of fireplaces and wood stoves. °¨ Get rid of pests (such as roaches and mice) and their droppings. °¨ Throw away plants if you see mold on them. °¨ Clean your floors. Dust regularly. Use cleaning products that do not smell. °¨ Have someone vacuum when you are not home. Use a vacuum cleaner with a HEPA filter if possible. °¨ Replace carpet with wood, tile, or vinyl flooring. Carpet can trap animal skin flakes and dust. °¨ Use allergy-proof pillows, mattress covers, and box spring covers. °¨ Wash bed sheets and blankets every week in hot water and dry them in a dryer. °¨ Use blankets that are made of polyester or cotton. °¨ Clean bathrooms and kitchens with bleach. If  possible, have someone repaint the walls in these rooms with mold-resistant paint. Keep out of the rooms that are being cleaned and painted. °¨ Wash hands often. °GET HELP IF: °· You have make a whistling sound when breaking (wheeze), have shortness of breath, or have a cough even if taking medicine to prevent attacks. °· The colored mucus you cough up (sputum) is thicker than usual. °· The colored mucus you cough up changes from clear or white to yellow, green, gray, or bloody. °· You have problems from the medicine you are taking such as: °¨ A rash. °¨ Itching. °¨ Swelling. °¨ Trouble breathing. °· You need reliever medicines more than 2-3 times a week. °· Your peak flow measurement is still at 50-79% of your personal best after following the action plan for 1 hour. °· You have a fever. °GET HELP RIGHT AWAY IF:  °· You seem to be worse and are not responding to medicine during an asthma attack. °· You are short of breath even at rest. °· You get short of breath when doing very little activity. °· You have trouble eating, drinking, or talking. °· You have chest pain. °· You have a fast heartbeat. °· Your lips or fingernails start to turn blue. °· You are light-headed, dizzy, or faint. °· Your peak flow is less than 50% of your personal best. °MAKE SURE YOU:  °· Understand these instructions. °· Will watch your condition. °· Will get help right away if you   are not doing well or get worse. Document Released: 06/08/2007 Document Revised: 05/06/2013 Document Reviewed: 07/19/2012 [address] [address] [facility] Patient Information 2015 [place], [place]. This information is not intended to replace advice given to you by your health care provider. Make sure you discuss any questions you have with your health care provider.  Use albuterol inhaler 2 puffs every 6 hours at least for the next 7 days then as needed. Take the prednisone as directed. Make an appointment to follow-up with your primary care doctor. Return for any new or worse  symptoms.

## 2014-05-08 NOTE — Progress Notes (Signed)
Subjective:    Patient ID: Jennifer SchleinColleen M [name], female    DOB: 10/23/1960, 54 y.o.   MRN: [id number]  HPI  Pt in feeling states yesterday around 11 am she felt some shortness of breath. She was tightness of chest yesterday and dizziness. She can't remember how long it lasted. Pt does have history of copd and bronchitis.  In the past she has used inhalers. Pt states recenlty that qvar not helping as much. Pt not coughing at night.  Pt states today feeling like can't enough oxygen in. Pt feels slight tightness in chest today. Like can't take full deep breath. No pain in her legs.   Pt has hx of smoking age 54 yo until 54 yo. Smoked about a pack a day.  On walking down hall. O2 Sat did decrease form 97% to 93-94%.  No chest pressure, no jaw pain, no shoulder pain, no nausea or vomiting.   Pt thought she was going to see pulmonologist today. Then scheduled with me??  Review of Systems  Constitutional: Negative for fever, chills and fatigue.  Respiratory: Positive for chest tightness and shortness of breath. Negative for cough, choking and wheezing.   Cardiovascular: Negative for chest pain and palpitations.  Gastrointestinal: Negative for abdominal pain.  Musculoskeletal: Negative for back pain.  Neurological: Positive for dizziness. Negative for seizures, syncope, speech difficulty, weakness, light-headedness and headaches.       After neb tx.  Hematological: Negative for adenopathy. Does not bruise/bleed easily.   Past Medical History  Diagnosis Date  . Discoid lupus   . Thyroid disease   . Perimenopausal   . Fibromyalgia   . CFS (chronic fatigue syndrome)   . Alcohol abuse   . Sleep apnea   . Asthmatic bronchitis 2013  . GERD (gastroesophageal reflux disease) 2013  . Hyperlipidemia     History   Social History  . Marital Status: Married    Spouse Name: N/A  . Number of Children: 4  . Years of Education: N/A   Occupational History  .     Social History Main Topics  .  Smoking status: Former Smoker -- 0.30 packs/day for 36 years    Types: Cigarettes  . Smokeless tobacco: Never Used     Comment: Started smoking at age 54.  Marland Kitchen. Alcohol Use: No     Comment: quit 01-2006/prior alcoholic-in AA  . Drug Use: No  . Sexual Activity: No   Other Topics Concern  . Not on file   Social History Narrative    Past Surgical History  Procedure Laterality Date  . Tubal ligation      bilateral  . Uvuloplasty    . Abdominal hysterectomy  09/2007    TAH with bladder sling  . Orto surgery plate in leg    . Nasal septum surgery      Family History  Problem Relation Age of Onset  . Hypertension Father   . Alcohol abuse Father   . Heart disease Sister 45    heart attack  . Heart attack Sister   . Depression Brother   . Alcohol abuse Brother     In remission  . Cancer Mother 337    colon  . Depression Brother   . Alcohol abuse Maternal Aunt   . Alcohol abuse Paternal Uncle     in remission  . Bipolar disorder Daughter [id number]    Suicide attempt    Allergies  Allergen Reactions  . Acetaminophen Other (See Comments)  Pt states she is unable to take this bc of Hashimoto Thyroiditis  . Prednisone     Hallucinations October 2014  . Hydrocodone-Acetaminophen Other (See Comments)    agitation  . Oxycodone Itching and Other (See Comments)    Hallucinating, sweating, itching Pt states she can take this in small doses    Current Outpatient Prescriptions on File Prior to Visit  Medication Sig Dispense Refill  . alendronate (FOSAMAX) 70 MG tablet Take 70 mg by mouth once a week. Take with a full glass of water on an empty stomach.    . gabapentin (NEURONTIN) 800 MG tablet Take 1.5 tablets at night 45 tablet 2  . lamoTRIgine (LAMICTAL) 100 MG tablet Take 1 tablet (100 mg total) by mouth daily. 30 tablet 2  . levothyroxine (SYNTHROID, LEVOTHROID) 150 MCG tablet TAKE 1 TABLET BY MOUTH EVERY DAY.**DUE FOR FOLLOW UP AND LAB WORK* 30 tablet 0  . LINZESS 145 MCG CAPS  capsule TAKE 1 CAPSULE BY MOUTH EVERY DAY FOR A WEEK. IF INSUFFICIENT RESPONSE TAKE 2 CAPSULES BY MOUTH EVERY DAY 30 capsule 0  . mirabegron ER (MYRBETRIQ) 50 MG TB24 tablet Take 1 tablet (50 mg total) by mouth daily. 30 tablet 11  . montelukast (SINGULAIR) 10 MG tablet TAKE 1 TABLET BY MOUTH AT BEDTIME 90 tablet 1  . PARoxetine (PAXIL) 40 MG tablet Take 1 tablet (40 mg total) by mouth every morning. 30 tablet 2  . QVAR 40 MCG/ACT inhaler INHALE 2 PUFFS INTO THE LUNGS DAILY AS NEEDED AS DIRECTED 8.7 g 4  . Tapentadol HCl (NUCYNTA) 75 MG TABS Take 50 mg by mouth every 4 (four) hours as needed.     . traZODone (DESYREL) 100 MG tablet Take two tablets at night. 60 tablet 2  . atorvastatin (LIPITOR) 40 MG tablet TAKE 1 TABLET BY MOUTH DAILY 30 tablet 0  . [DISCONTINUED] mometasone (NASONEX) 50 MCG/ACT nasal spray Place 2 sprays into the nose daily. 17 g 12  . [DISCONTINUED] omeprazole (PRILOSEC) 20 MG capsule     . [DISCONTINUED] oxybutynin (DITROPAN-XL) 5 MG 24 hr tablet Take 1 tablet (5 mg total) by mouth daily. 30 tablet 3  . [DISCONTINUED] sucralfate (CARAFATE) 1 G tablet Take 2 tablets (2 g total) by mouth 2 (two) times daily before a meal. preferbly an hour before eating 120 tablet 6   No current facility-administered medications on file prior to visit.    BP 139/84 mmHg  Pulse 80  Temp(Src) 98.1 F (36.7 C) (Oral)  Ht  (1.651 m)  Wt 210 lb 12.8 oz (95.618 kg)  BMI 35.08 kg/m2  SpO2 96%       Objective:   Physical Exam  General  Mental Status - Alert. General Appearance - Well groomed. Not in acute distress.  Skin Rashes- No Rashes.  HEENT Head- Normal. Ear Auditory Canal - Left- Normal. Right - Normal.Tympanic Membrane- Left- Normal. Right- Normal. Eye Sclera/Conjunctiva- Left- Normal. Right- Normal. Nose & Sinuses Nasal Mucosa- Left-  Boggy and Congested. Right-  Boggy and  Congested.Bilateral maxillary and frontal sinus pressure. Mouth & Throat Lips: Upper Lip-  Normal: no dryness, cracking, pallor, cyanosis, or vesicular eruption. Lower Lip-Normal: no dryness, cracking, pallor, cyanosis or vesicular eruption. Buccal Mucosa- Bilateral- No Aphthous ulcers. Oropharynx- No Discharge or Erythema. Tonsils: Characteristics- Bilateral- No Erythema or Congestion. Size/Enlargement- Bilateral- No enlargement. Discharge- bilateral-None.  Neck Neck- Supple. No Masses.   Chest and Lung Exam Auscultation: Breath Sounds:- even unlabored. Mild shallow respirations.  Cardiovascular Auscultation:Rythm-  Regular, rate and rhythm. Murmurs & Other Heart Sounds:Ausculatation of the heart reveal- No Murmurs.  Lymphatic Head & Neck General Head & Neck Lymphatics: Bilateral: Description- No Localized lymphadenopathy.       Assessment & Plan:

## 2014-05-08 NOTE — ED Provider Notes (Signed)
CSN: [id number]     Arrival date & time 05/08/14  4098 History  This chart was scribed for Jennifer Mulders, MD by Jennifer [name], ED Scribe. This patient was seen in room MH03/MH03 and the patient's care was started at 8:06 PM.    Chief Complaint  Patient presents with  . Chest Pain   Patient is a 54 y.o. female presenting with chest pain. The history is provided by the patient. No language interpreter was used.  Chest Pain Pain location:  L chest and R chest Pain quality: tightness   Pain radiates to:  Does not radiate Pain radiates to the back: no   Pain severity:  Moderate Onset quality:  Gradual Duration:  2 days Timing:  Constant Progression:  Waxing and waning Chronicity:  Recurrent Context: breathing   Relieved by:  Nothing Worsened by:  Nothing tried Ineffective treatments: Albuterol neb. Associated symptoms: cough, dizziness and shortness of breath   Associated symptoms: no abdominal pain, no back pain, no fever, no headache, no lower extremity edema and no nausea   Risk factors comment:  COPD (chronic bronchitis)    HPI Comments: Jennifer [name] is a 54 y.o. female with past medical history of COPD (chronic bronchitis) who presents to the Emergency Department complaining of 2 days of chest tightness. Patient reports her symptoms began yesterday with general malaise and chest discomfort; today, her symptoms continued, worsening around 16:00 with SOB and dizziness. She explains, "I felt like I wasn't getting enough oxygen." She states she was seen by a PA at her pulmonologist's office today, where she received a breathing treatment without relief; she was referred to the ED at that time. Patient also reports recent productive cough. She denies fevers, abdominal pain, rhinorrhea, rash.   She takes 2 puffs of QVAR 2x daily. She states she has been on prednisone previously for similar symptoms.   Past Medical History  Diagnosis Date  . Discoid lupus   . Thyroid disease   .  Perimenopausal   . Fibromyalgia   . CFS (chronic fatigue syndrome)   . Alcohol abuse   . Sleep apnea   . Asthmatic bronchitis 2013  . GERD (gastroesophageal reflux disease) 2013  . Hyperlipidemia    Past Surgical History  Procedure Laterality Date  . Tubal ligation      bilateral  . Uvuloplasty    . Abdominal hysterectomy  09/2007    TAH with bladder sling  . Orto surgery plate in leg    . Nasal septum surgery     Family History  Problem Relation Age of Onset  . Hypertension Father   . Alcohol abuse Father   . Heart disease Sister 45    heart attack  . Heart attack Sister   . Depression Brother   . Alcohol abuse Brother     In remission  . Cancer Mother 44    colon  . Depression Brother   . Alcohol abuse Maternal Aunt   . Alcohol abuse Paternal Uncle     in remission  . Bipolar disorder Daughter 1    Suicide attempt   History  Substance Use Topics  . Smoking status: Former Smoker -- 0.30 packs/day for 36 years    Types: Cigarettes  . Smokeless tobacco: Never Used     Comment: Started smoking at age 27.  Jennifer [name] Alcohol Use: No     Comment: quit 01-2006/prior alcoholic-in AA   OB History    No data available  Review of Systems  Constitutional: Negative for fever and chills.  HENT: Negative for rhinorrhea and sore throat.   Eyes: Negative for visual disturbance.  Respiratory: Positive for cough, chest tightness and shortness of breath.   Cardiovascular: Positive for chest pain. Negative for leg swelling.  Gastrointestinal: Negative for nausea, abdominal pain and diarrhea.  Genitourinary: Negative for dysuria, frequency and hematuria.  Musculoskeletal: Positive for myalgias. Negative for back pain and neck pain.  Skin: Negative for rash.  Neurological: Positive for dizziness. Negative for headaches.  Hematological: Does not bruise/bleed easily.  Psychiatric/Behavioral: Negative for confusion.      Allergies  Acetaminophen; Prednisone;  Hydrocodone-acetaminophen; and Oxycodone  Home Medications   Prior to Admission medications   Medication Sig Start Date End Date Taking? Authorizing Provider  alendronate (FOSAMAX) 70 MG tablet Take 70 mg by mouth once a week. Take with a full glass of water on an empty stomach.    Historical Provider, MD  atorvastatin (LIPITOR) 40 MG tablet TAKE 1 TABLET BY MOUTH DAILY 04/22/14   Monica Becton, MD  gabapentin (NEURONTIN) 800 MG tablet Take 1.5 tablets at night 10/29/13   Thresa Ross, MD  lamoTRIgine (LAMICTAL) 100 MG tablet Take 1 tablet (100 mg total) by mouth daily. 10/29/13   Thresa Ross, MD  levothyroxine (SYNTHROID, LEVOTHROID) 150 MCG tablet TAKE 1 TABLET BY MOUTH EVERY DAY.**DUE FOR FOLLOW UP AND LAB WORK* 04/28/14   Monica Becton, MD  LINZESS 145 MCG CAPS capsule TAKE 1 CAPSULE BY MOUTH EVERY DAY FOR A WEEK. IF INSUFFICIENT RESPONSE TAKE 2 CAPSULES BY MOUTH EVERY DAY 01/27/14   Monica Becton, MD  mirabegron ER (MYRBETRIQ) 50 MG TB24 tablet Take 1 tablet (50 mg total) by mouth daily. 06/28/13   Monica Becton, MD  montelukast (SINGULAIR) 10 MG tablet TAKE 1 TABLET BY MOUTH AT BEDTIME 12/19/13   Monica Becton, MD  PARoxetine (PAXIL) 40 MG tablet Take 1 tablet (40 mg total) by mouth every morning. 10/29/13   Thresa Ross, MD  predniSONE (DELTASONE) 10 MG tablet Take 4 tablets (40 mg total) by mouth daily. 05/08/14   Jennifer Mulders, MD  QVAR 40 MCG/ACT inhaler INHALE 2 PUFFS INTO THE LUNGS DAILY AS NEEDED AS DIRECTED 01/13/14   Monica Becton, MD  Tapentadol HCl (NUCYNTA) 75 MG TABS Take 50 mg by mouth every 4 (four) hours as needed.     Historical Provider, MD  traZODone (DESYREL) 100 MG tablet Take two tablets at night. 10/29/13   Thresa Ross, MD   BP 126/60 mmHg  Pulse 73  Temp(Src) 98.3 F (36.8 C) (Oral)  Resp 18  Ht  (1.651 m)  Wt 210 lb (95.255 kg)  BMI 34.95 kg/m2  SpO2 96% Physical Exam  Constitutional: She is oriented to  person, place, and time. She appears well-developed and well-nourished.  HENT:  Head: Normocephalic and atraumatic.  Moist mucous membranes  Eyes: EOM are normal. Pupils are equal, round, and reactive to light. No scleral icterus.  Neck: No tracheal deviation present.  Cardiovascular: Normal rate, regular rhythm and normal heart sounds.  Exam reveals no gallop and no friction rub.   No murmur heard. Pulmonary/Chest: Effort normal. No respiratory distress. She has no wheezes. She has no rales.  Air movement decreased bilaterally  Abdominal: Soft. Bowel sounds are normal. She exhibits no distension. There is no tenderness. There is no rebound and no guarding.  Musculoskeletal: She exhibits no edema.  Neurological: She is alert and oriented to  person, place, and time. No cranial nerve deficit.  Skin: Skin is warm and dry.  Psychiatric: She has a normal mood and affect. Her behavior is normal.  Nursing note and vitals reviewed.   ED Course  Procedures   DIAGNOSTIC STUDIES: Oxygen Saturation is 97% on RA, normal by my interpretation.    COORDINATION OF CARE: 8:15 PM Discussed treatment plan with pt at bedside and pt agreed to plan.   Labs Review Labs Reviewed  BASIC METABOLIC PANEL - Abnormal; Notable for the following:    Glucose, Bld 106 (*)    Calcium 8.7 (*)    All other components within normal limits  CBC WITH DIFFERENTIAL/PLATELET  TROPONIN I   Results for orders placed or performed during the hospital encounter of 05/08/14  CBC with Differential  Result Value Ref Range   WBC 4.4 4.0 - 10.5 K/uL   RBC 4.21 3.87 - 5.11 MIL/uL   Hemoglobin 13.3 12.0 - 15.0 g/dL   HCT 16.138.3 09.636.0 - 04.546.0 %   MCV 91.0 78.0 - 100.0 fL   MCH 31.6 26.0 - 34.0 pg   MCHC 34.7 30.0 - 36.0 g/dL   RDW 40.913.1 81.111.5 - 91.415.5 %   Platelets 154 150 - 400 K/uL   Neutrophils Relative % 52 43 - 77 %   Neutro Abs 2.3 1.7 - 7.7 K/uL   Lymphocytes Relative 40 12 - 46 %   Lymphs Abs 1.8 0.7 - 4.0 K/uL    Monocytes Relative 6 3 - 12 %   Monocytes Absolute 0.3 0.1 - 1.0 K/uL   Eosinophils Relative 1 0 - 5 %   Eosinophils Absolute 0.0 0.0 - 0.7 K/uL   Basophils Relative 1 0 - 1 %   Basophils Absolute 0.1 0.0 - 0.1 K/uL  Basic metabolic panel  Result Value Ref Range   Sodium 139 135 - 145 mmol/L   Potassium 3.6 3.5 - 5.1 mmol/L   Chloride 104 101 - 111 mmol/L   CO2 26 22 - 32 mmol/L   Glucose, Bld 106 (H) 70 - 99 mg/dL   BUN 12 6 - 20 mg/dL   Creatinine, Ser 7.820.86 0.44 - 1.00 mg/dL   Calcium 8.7 (L) 8.9 - 10.3 mg/dL   GFR calc non Af Amer >60 >60 mL/min   GFR calc Af Amer >60 >60 mL/min   Anion gap 9 5 - 15  Troponin I  Result Value Ref Range   Troponin I <0.03 <0.031 ng/mL    Imaging Review Dg Chest 2 View  05/08/2014   CLINICAL DATA:  Shortness of breath and chest tightness  EXAM: CHEST  2 VIEW  COMPARISON:  11/14/2013  FINDINGS: Normal heart size and mediastinal contours. No acute infiltrate or edema. No effusion or pneumothorax. No acute osseous findings.  IMPRESSION: No active cardiopulmonary disease.   Electronically Signed   By: Marnee SpringJonathon  Watts M.D.   On: 05/08/2014 21:05     EKG Interpretation   Date/Time:  Thursday May 08 2014 18:45:31 EDT Ventricular Rate:  75 PR Interval:  174 QRS Duration: 72 QT Interval:  408 QTC Calculation: 455 R Axis:   56 Text Interpretation:  Sinus rhythm with occasional Premature ventricular  complexes Nonspecific T wave abnormality Abnormal ECG No previous ECGs  available Confirmed by Zollie Ellery  MD, Ebbie Cherry [phone]([id number]) on 05/08/2014 8:02:59 PM      MDM   Final diagnoses:  SOB (shortness of breath)  Chronic obstructive pulmonary disease, unspecified COPD, unspecified chronic bronchitis type  Patient with history of asthma or COPD. Patient improved here with the nebulizer treatment. Chest x-ray negative for any evidence of pneumonia or pulmonary edema pneumothorax. Patient will be started on a short course of steroids. Patient is not really  allergic just makes her unable to sleep well. Patient will use albuterol inhaler for the next 7 days then as needed. Minimal take the steroids for the next 5 days. Patient will follow-up with her regular doctor. From a cardiac standpoint patient's EKG had no significant acute changes and troponin was negative. Symptoms been ongoing for the past 2 days. Clinically not concerned about an acute cardiac event.  I personally performed the services described in this documentation, which was scribed in my presence. The recorded information has been reviewed and is accurate.       Jennifer MuldersScott Yuvaan Olander, MD 05/08/14 [phone]

## 2014-05-08 NOTE — Progress Notes (Signed)
Pre visit review using our clinic review tool, if applicable. No additional management support is needed unless otherwise documented below in the visit note. 

## 2014-05-08 NOTE — Progress Notes (Signed)
Patient states that her breathing is better after nebulizer treatment.

## 2014-05-11 ENCOUNTER — Other Ambulatory Visit (HOSPITAL_COMMUNITY): Payer: Self-pay | Admitting: Psychiatry

## 2014-05-12 NOTE — Telephone Encounter (Signed)
Received fax from the pharmacy for Trazodone 100mg . Per Dr. Gilmore LarocheAkhtar, request for Trazodone 100mg  is denied. Pt will need to schedule appt for medication refill. Pt was last seen in 10/15.  L/M pt to return call to office to schedule appt.

## 2014-05-13 ENCOUNTER — Telehealth (HOSPITAL_COMMUNITY): Payer: Self-pay | Admitting: *Deleted

## 2014-05-13 MED ORDER — TRAZODONE HCL 100 MG PO TABS: ORAL_TABLET | ORAL | Status: DC

## 2014-05-13 NOTE — Telephone Encounter (Signed)
Pt called for a refill for Trazodone 100mg . Per Dr. Gilmore LarocheAkhtar, pt is authorized for a refill for Trazodone 100mg , Qty 30. Prescription was sent to pharmacy. Called and informed pt prescription is only a 15 day supply. Pt states and shows understanding.  Pt has a f/u appt on 5/19.

## 2014-05-22 ENCOUNTER — Ambulatory Visit (HOSPITAL_COMMUNITY): Payer: Self-pay | Admitting: Psychiatry

## 2014-05-25 ENCOUNTER — Other Ambulatory Visit: Payer: Self-pay | Admitting: Sports Medicine

## 2014-05-25 ENCOUNTER — Other Ambulatory Visit (HOSPITAL_COMMUNITY): Payer: Self-pay | Admitting: Psychiatry

## 2014-05-29 ENCOUNTER — Encounter (HOSPITAL_COMMUNITY): Payer: Self-pay | Admitting: Psychiatry

## 2014-05-29 ENCOUNTER — Ambulatory Visit (INDEPENDENT_AMBULATORY_CARE_PROVIDER_SITE_OTHER): Payer: Self-pay | Admitting: Psychiatry

## 2014-05-29 VITALS — BP 124/66 | HR 60 | Ht 65.0 in | Wt 215.0 lb

## 2014-05-29 DIAGNOSIS — F1021 Alcohol dependence, in remission: Secondary | ICD-10-CM

## 2014-05-29 DIAGNOSIS — G4700 Insomnia, unspecified: Secondary | ICD-10-CM

## 2014-05-29 DIAGNOSIS — F411 Generalized anxiety disorder: Secondary | ICD-10-CM

## 2014-05-29 DIAGNOSIS — M797 Fibromyalgia: Secondary | ICD-10-CM

## 2014-05-29 DIAGNOSIS — F332 Major depressive disorder, recurrent severe without psychotic features: Secondary | ICD-10-CM

## 2014-05-29 MED ORDER — LAMOTRIGINE 100 MG PO TABS
100.0000 mg | ORAL_TABLET | Freq: Every day | ORAL | Status: DC
Start: 2014-05-29 — End: 2014-10-09

## 2014-05-29 MED ORDER — GABAPENTIN 800 MG PO TABS: ORAL_TABLET | ORAL | Status: DC

## 2014-05-29 MED ORDER — TRAZODONE HCL 100 MG PO TABS: ORAL_TABLET | ORAL | Status: DC

## 2014-05-29 MED ORDER — PAROXETINE HCL 40 MG PO TABS: 40.0000 mg | ORAL_TABLET | ORAL | Status: DC

## 2014-05-29 NOTE — Progress Notes (Signed)
Patient ID: Jennifer [name], female   DOB: March 12, 1960, 54 y.o.   MRN: [id number]   [facility] Follow-up Outpatient Visit  Jennifer [name] 1960/08/04  Date:06/12/2013  HPI Comments: SUBJECTIVE: Jennifer [name] is a 54 year old female with a diagnosis of major Depressive Disorder. 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   The patient is referred for psychiatric services for medication management.    . Location; Patient reports she is doing reasonable mood wise. More so concerned about her physical health, had a recent flare up of COPD and was on steroids. Says her fibromyalgia exacerbates her tiredness. Takes gabapentin . Quality: Patient's not feeling hopeless or helpless. She does not want to change her mood stabilizers but want to increase trazadone. Says she needs  for sleep. She does use her CPap machine.  No rash or reported side effect. Keeps herself busy during the day.  She is taking gabapentin  one at night only. No relapse on Alcohol and remains sober.  . Severity: Depression: 7/10 (0=Very depressed; 5=Neutral; 10=Very Happy)  Anxiety- 2/10 (0=no anxiety; 5= moderate/tolerable anxiety; 10= panic attacks)  . Duration" Since age 73.  . Timing: Mood is worse some years during Christmas if she cannot see her children.  . Context: Patient reports her main stressor has to do pain and flare-ups of fibromyalgia or coPd..  . Associated signs and symptoms : Denies any recent episodes consistent with mania, particularly decreased need for sleep with increased energy, grandiosity, impulsivity, hyperverbal and pressured speech, or increased productivity. Denies any  recent symptoms consistent with psychosis, particularly auditory or visual hallucinations, thought broadcasting/insertion/withdrawal, or ideas of reference. Also denies excessive worry to the point of physical symptoms as well as any panic attacks. Denies any history of trauma or symptoms  consistent with PTSD such as flashbacks, nightmares, hypervigilance, feelings of numbness or inability to connect with others.   Review of Systems  Constitutional: Negative for fever.  Musculoskeletal: Positive for myalgias.  Skin: Negative for rash.  Neurological: Negative for headaches.   Weight 206 lbs. Height  pulse 90.   Physical Exam  Vitals reviewed.  Constitutional: She appears well-developed and well-nourished. No distress.  Skin: She is not diaphoretic.  Musculoskeletal: Gait & Station: normal Patient leans: N/A   Traumatic Brain Injury: No   Past Medical History: Reviewed.  Past Medical History   Diagnosis  Date   .  Discoid lupus    .  Thyroid disease    .  Perimenopausal    .  Fibromyalgia    .  CFS (chronic fatigue syndrome)    .  Sleep apnea      Allergies: Reviewed.  Allergies   Allergen  Reactions   .  Acetaminophen      Pt states she is unable to take this bc of Hashimoto Thyroid   .  Hydrocodone-Acetaminophen      REACTION: agitation   .  Oxycodone      Hallucinating, sweating, itching    Current Medications: Reviewed.  Current Outpatient Prescriptions on File Prior to Visit  Medication Sig Dispense Refill  . alendronate (FOSAMAX) 70 MG tablet Take 70 mg by mouth once a week. Take with a full glass of water on an empty stomach.    Marland Kitchen atorvastatin (LIPITOR) 40 MG tablet TAKE 1 TABLET BY MOUTH EVERY DAY**PT NEEDS FOLLOW UP APPT FOR FURTHER REFILLS** 30 tablet 0  . levothyroxine (SYNTHROID, LEVOTHROID) 150 MCG tablet TAKE 1 TABLET BY MOUTH EVERY DAY.** DUE  FOR FOLLOW UP AND LAB WORK* 30 tablet 0  . LINZESS 145 MCG CAPS capsule TAKE 1 CAPSULE BY MOUTH EVERY DAY FOR A WEEK. IF INSUFFICIENT RESPONSE TAKE 2 CAPSULES BY MOUTH EVERY DAY 30 capsule 0  . mirabegron ER (MYRBETRIQ) 50 MG TB24 tablet Take 1 tablet (50 mg total) by mouth daily. 30 tablet 11  . montelukast (SINGULAIR) 10 MG tablet TAKE 1 TABLET BY MOUTH AT BEDTIME 90 tablet 1  . QVAR 40  MCG/ACT inhaler INHALE 2 PUFFS INTO THE LUNGS DAILY AS NEEDED AS DIRECTED 8.7 g 4  . Tapentadol HCl (NUCYNTA) 75 MG TABS Take 50 mg by mouth every 4 (four) hours as needed.     . [DISCONTINUED] mometasone (NASONEX) 50 MCG/ACT nasal spray Place 2 sprays into the nose daily. 17 g 12  . [DISCONTINUED] omeprazole (PRILOSEC) 20 MG capsule     . [DISCONTINUED] oxybutynin (DITROPAN-XL) 5 MG 24 hr tablet Take 1 tablet (5 mg total) by mouth daily. 30 tablet 3  . [DISCONTINUED] sucralfate (CARAFATE) 1 G tablet Take 2 tablets (2 g total) by mouth 2 (two) times daily before a meal. preferbly an hour before eating 120 tablet 6   No current facility-administered medications on file prior to visit.       Family History: Reviewed.  Family History   Problem  Relation  Age of Onset   .  Hypertension  Father    .  Heart disease  Sister  45      heart attack    .  Depression  Brother    .  Cancer  Mother  31      colon    Psychiatric Specialty Exam: General Appearance: Fairly Groomed  Patent attorney::  Good  Speech:  Clear and Coherent and Normal Rate  Volume:  Normal  Mood:  "good"  Affect:  Appropriate  Thought Process:  Loose  Orientation:  Full (Time, Place, and Person)  Thought Content:  WDL  Suicidal Thoughts:  No  Homicidal Thoughts:  No  Memory:  Immediate;   Good Recent;   Good Remote;   Good  Judgement:  Good  Insight:  Fair  Psychomotor Activity:  Normal  Concentration:  Fair  Recall:  Poor  Akathisia:  No  Handed:  Right  Fund of knowledge-average to above average  Language-Intact  AIMS (if indicated):     Assets:  Manufacturing systems engineer Desire for Improvement Financial Resources/Insurance Housing Intimacy Leisure Time Physical Health Resilience Social Support Talents/Skills Transportation Vocational/Educational  Language intact   Fund of knowledge is good      Assessment:  Major Depression, Recurrent severe-stable Alcohol Dependence in full sustained  remission-stable AXIS I  Major Depression, Recurrent severe, Alcohol Dependence in full sustained remission . fibromyalgia     Treatment Plan/Recommendations:   1. Affirm with the patient that the medications are taken as ordered. Patient expressed understanding of how their medications were to be used.  2. Continue the following psychiatric medications as written prior to this appointment with the following changes:  Continue current prescription of Paxil, lamictal and trazadone. increse trazadone to  qhs. Prescriptions sent   Discussed sleep hygiene. Her  sleep has improved says it is worse in summer. 3. Therapy: brief supportive therapy provided. Discussed psychosocial stressors. Continue current services. More than 50% of the visit was spent on individual therapy/counseling. 4. Risks and benefits, side effects and alternatives discussed with patient, she was given an opportunity to ask questions about his/her medication, illness, and  treatment. All current psychiatric medications have been reviewed and discussed with the patient and adjusted as clinically appropriate. The patient has been provided an accurate and updated list of the medications being now prescribed.  5. Patient told to call clinic if any problems occur. Patient advised to go to ER if she should develop SI/HI, side effects, or if symptoms worsen. Has crisis numbers to call if needed.  6. No labs warranted at this time.  7. The patient was encouraged to keep all PCP and specialty clinic appointments.  8. Patient was instructed to return to clinic in 2-3 month.  9.The patient expressed understanding of the above and agrees with the plan.  10.follow up 3 months.    Thresa RossAKHTAR, Grayden Burley, MD

## 2014-05-30 NOTE — Telephone Encounter (Signed)
Pt refilled on 5/26.

## 2014-06-17 ENCOUNTER — Telehealth: Payer: Self-pay | Admitting: *Deleted

## 2014-06-17 ENCOUNTER — Encounter: Payer: Self-pay | Admitting: Family Medicine

## 2014-06-17 ENCOUNTER — Ambulatory Visit (INDEPENDENT_AMBULATORY_CARE_PROVIDER_SITE_OTHER): Payer: BLUE CROSS/BLUE SHIELD | Admitting: Family Medicine

## 2014-06-17 VITALS — BP 134/78 | HR 93 | Temp 98.1°F | Wt 210.0 lb

## 2014-06-17 DIAGNOSIS — J449 Chronic obstructive pulmonary disease, unspecified: Secondary | ICD-10-CM | POA: Diagnosis not present

## 2014-06-17 DIAGNOSIS — R5383 Other fatigue: Secondary | ICD-10-CM

## 2014-06-17 MED ORDER — BECLOMETHASONE DIPROPIONATE 80 MCG/ACT IN AERS: 2.0000 | INHALATION_SPRAY | Freq: Two times a day (BID) | RESPIRATORY_TRACT | Status: DC

## 2014-06-17 NOTE — Progress Notes (Signed)
CC: Jennifer [name] is a 54 y.o. female is here for Fatigue   Subjective: HPI:  Initially scheduled for acute visit for facial swelling however this resolved by the time her appointment occurred.  She would like to talk about fatigue that she's been experiencing for matter of years. Over the past 3 weeks it seems to have deteriorated. He is currently moderate to severe in severity. Today's the first day that she left her house in the last 3 weeks. She reports difficulty with getting out of bed or even thinking about doing laundry or other household chores. Nothing particularly symptomatic symptoms better or worse but she does believe that fibromyalgia and lupus are heavily influencing this. She denies any shortness of breath or focal weakness. Nothing seems to make symptoms better. Onset was gradual and symptoms have been persistent. Review of systems positive for unintentional sweating, unintentional weight gain over the last months.  Complains of frequent coughing despite using Qvar on a daily basis. She's had use albuterol multiple times these past couple weeks for coughing. Cough is nonproductive without blood and sputum. She denies any chest pain or irregular heartbeat. She denies any dysphasia or nasal congestion.   Review Of Systems Outlined In HPI  Past Medical History  Diagnosis Date  . Discoid lupus   . Thyroid disease   . Perimenopausal   . Fibromyalgia   . CFS (chronic fatigue syndrome)   . Alcohol abuse   . Sleep apnea   . Asthmatic bronchitis 2013  . GERD (gastroesophageal reflux disease) 2013  . Hyperlipidemia     Past Surgical History  Procedure Laterality Date  . Tubal ligation      bilateral  . Uvuloplasty    . Abdominal hysterectomy  09/2007    TAH with bladder sling  . Orto surgery plate in leg    . Nasal septum surgery     Family History  Problem Relation Age of Onset  . Hypertension Father   . Alcohol abuse Father   . Heart disease Sister 45    heart  attack  . Heart attack Sister   . Depression Brother   . Alcohol abuse Brother     In remission  . Cancer Mother 61    colon  . Depression Brother   . Alcohol abuse Maternal Aunt   . Alcohol abuse Paternal Uncle     in remission  . Bipolar disorder Daughter 32    Suicide attempt    History   Social History  . Marital Status: Married    Spouse Name: N/A  . Number of Children: 4  . Years of Education: N/A   Occupational History  .     Social History Main Topics  . Smoking status: Former Smoker -- 0.30 packs/day for 36 years    Types: Cigarettes  . Smokeless tobacco: Never Used     Comment: Started smoking at age 23.  Marland Kitchen Alcohol Use: No     Comment: quit 01-2006/prior alcoholic-in AA  . Drug Use: No  . Sexual Activity: No   Other Topics Concern  . Not on file   Social History Narrative     Objective: BP 134/78 mmHg  Pulse 93  Temp(Src) 98.1 F (36.7 C) (Oral)  Wt 210 lb (95.255 kg)  SpO2 95%  General: Alert and Oriented, No Acute Distress HEENT: Pupils equal, round, reactive to light. Conjunctivae clear.  Moist mucous membranes pharynx unremarkable. Lungs: Clear to auscultation bilaterally, no wheezing/ronchi/rales.  Comfortable work of  breathing. Good air movement. Cardiac: Regular rate and rhythm. Normal S1/S2.  No murmurs, rubs, nor gallops.   Extremities: No peripheral edema.  Strong peripheral pulses.  Mental Status: Mildly depressed and tearful. No anxiety, nor agitation. Skin: Warm and dry.  Assessment & Plan: Jennifer [name] was seen today for fatigue.  Diagnoses and all orders for this visit:  Obstructive chronic bronchitis without exacerbation Orders: -     Vit D  25 hydroxy (rtn osteoporosis monitoring) -     TSH -     COMPLETE METABOLIC PANEL WITH GFR -     beclomethasone (QVAR) 80 MCG/ACT inhaler; Inhale 2 puffs into the lungs 2 (two) times daily.  Other fatigue Orders: -     Vit D  25 hydroxy (rtn osteoporosis monitoring) -     TSH -      COMPLETE METABOLIC PANEL WITH GFR -     beclomethasone (QVAR) 80 MCG/ACT inhaler; Inhale 2 puffs into the lungs 2 (two) times daily.   COPD: Uncontrolled chronic condition increasing Qvar to max dose. Fatigue: Rule out deficient thyroid supplementation, vitamin D deficiency or metabolic abnormality. Ultimate plan will be based on these results.  Return if symptoms worsen or fail to improve.

## 2014-06-17 NOTE — Telephone Encounter (Signed)
Pt inquired about switching providers from Dr. Benjamin Stain to Dr. Ivan Anchors. After speaking with Dr. Ivan Anchors he would like for patient to remain with her current PCP.

## 2014-06-18 ENCOUNTER — Telehealth: Payer: Self-pay | Admitting: Family Medicine

## 2014-06-18 DIAGNOSIS — R739 Hyperglycemia, unspecified: Secondary | ICD-10-CM

## 2014-06-18 LAB — TSH: TSH: 1.184 u[IU]/mL (ref 0.350–4.500)

## 2014-06-18 LAB — VITAMIN D 25 HYDROXY (VIT D DEFICIENCY, FRACTURES): VIT D 25 HYDROXY: 27 ng/mL — AB (ref 30–100)

## 2014-06-18 LAB — COMPLETE METABOLIC PANEL WITH GFR
ALT: 24 U/L (ref 0–35)
AST: 19 U/L (ref 0–37)
Albumin: 4.6 g/dL (ref 3.5–5.2)
Alkaline Phosphatase: 49 U/L (ref 39–117)
BUN: 11 mg/dL (ref 6–23)
CO2: 27 mEq/L (ref 19–32)
CREATININE: 0.98 mg/dL (ref 0.50–1.10)
Calcium: 9.3 mg/dL (ref 8.4–10.5)
Chloride: 103 mEq/L (ref 96–112)
GFR, EST NON AFRICAN AMERICAN: 65 mL/min
GFR, Est African American: 75 mL/min
GLUCOSE: 126 mg/dL — AB (ref 70–99)
Potassium: 4.6 mEq/L (ref 3.5–5.3)
Sodium: 140 mEq/L (ref 135–145)
Total Bilirubin: 0.6 mg/dL (ref 0.2–1.2)
Total Protein: 6.8 g/dL (ref 6.0–8.3)

## 2014-06-18 MED ORDER — VITAMIN D (ERGOCALCIFEROL) 1.25 MG (50000 UNIT) PO CAPS: 50000.0000 [IU] | ORAL_CAPSULE | ORAL | Status: DC

## 2014-06-18 NOTE — Telephone Encounter (Signed)
Sue Lush, Will you please let patient know that her thyroid supplementation appears to be adequate however her vitamin D level was quite low and could be causing her fatigue.  I'd recommend starting a weekly vitamin D supplement to help raise her Vitamin D level.  Also, her blood sugar was moderately elevated so I'd recommend checking a three month average blood sugar called an A1c test.  If the lab is unable to add this on a lab slip is in your inbox.  I would recommend she f/u with Dr. Karie Schwalbe in about a month to recheck symptoms.

## 2014-06-18 NOTE — Telephone Encounter (Signed)
Pt.notified

## 2014-06-19 LAB — HEMOGLOBIN A1C
Hgb A1c MFr Bld: 5.6 % (ref <5.7)
Mean Plasma Glucose: 114 mg/dL (ref <117)

## 2014-06-27 ENCOUNTER — Other Ambulatory Visit: Payer: Self-pay | Admitting: Sports Medicine

## 2014-07-15 ENCOUNTER — Ambulatory Visit (INDEPENDENT_AMBULATORY_CARE_PROVIDER_SITE_OTHER): Payer: BLUE CROSS/BLUE SHIELD | Admitting: Sports Medicine

## 2014-07-15 ENCOUNTER — Encounter: Payer: Self-pay | Admitting: Sports Medicine

## 2014-07-15 VITALS — BP 125/86 | HR 81 | Temp 98.3°F | Wt 210.0 lb

## 2014-07-15 DIAGNOSIS — R0602 Shortness of breath: Secondary | ICD-10-CM | POA: Diagnosis not present

## 2014-07-15 DIAGNOSIS — N951 Menopausal and female climacteric states: Secondary | ICD-10-CM | POA: Diagnosis not present

## 2014-07-15 DIAGNOSIS — F332 Major depressive disorder, recurrent severe without psychotic features: Secondary | ICD-10-CM

## 2014-07-15 DIAGNOSIS — J449 Chronic obstructive pulmonary disease, unspecified: Secondary | ICD-10-CM

## 2014-07-15 DIAGNOSIS — Z23 Encounter for immunization: Secondary | ICD-10-CM

## 2014-07-15 DIAGNOSIS — R6889 Other general symptoms and signs: Secondary | ICD-10-CM

## 2014-07-15 DIAGNOSIS — J4489 Other specified chronic obstructive pulmonary disease: Secondary | ICD-10-CM

## 2014-07-15 DIAGNOSIS — E039 Hypothyroidism, unspecified: Secondary | ICD-10-CM

## 2014-07-15 MED ORDER — FOLIC ACID 1 MG PO TABS: 1.0000 mg | ORAL_TABLET | Freq: Every day | ORAL | Status: DC

## 2014-07-15 MED ORDER — ESTRADIOL 1 MG PO TABS: 1.0000 mg | ORAL_TABLET | Freq: Every day | ORAL | Status: DC

## 2014-07-15 NOTE — Progress Notes (Signed)
  Subjective:    CC: Follow-up  HPI: Sweating: We have treated her in the past for vasomotor instability due to perimenopause, she did well on estrogen low-dose, but has since discontinued this. She is unsure why.  Difficult in breathing: No true COPD on a past spirometry test, she has seen her pulmonologist. They did send her to the emergency department, d-dimer and cardiac workup was negative. She simply has shortness of breath was mild chest tightness, this is worsened with anxiety, she does have a subjective cough and wheeze.  Major depression: Managed by psychiatry  Forgetfulness: Minimal, does not interfere much with her daily life, she is amenable to discuss this further with mental health.  Hypothyroidism: Stable  Past medical history, Surgical history, Family history not pertinant except as noted below, Social history, Allergies, and medications have been entered into the medical record, reviewed, and no changes needed.   Review of Systems: No fevers, chills, night sweats, weight loss, chest pain, or shortness of breath.   Objective:    General: Well Developed, well nourished, and in no acute distress.  Neuro: Alert and oriented x3, extra-ocular muscles intact, sensation grossly intact.  HEENT: Normocephalic, atraumatic, pupils equal round reactive to light, neck supple, no masses, no lymphadenopathy, thyroid nonpalpable.  Skin: Warm and dry, no rashes. Cardiac: Regular rate and rhythm, no murmurs rubs or gallops, no lower extremity edema.  Respiratory: Clear to auscultation bilaterally. Not using accessory muscles, speaking in full sentences.  Impression and Recommendations:    I spent 40 minutes with this patient, greater than 50% was face-to-face time counseling regarding the above diagnoses

## 2014-07-15 NOTE — Assessment & Plan Note (Signed)
Well controlled, no changes 

## 2014-07-15 NOTE — Assessment & Plan Note (Signed)
Return for pre-and post bronchodilator spirometry. Nebulizer treatments in the past have not improved shortness of breath and chest tightness, and he suspect there is a psychiatric element here. Ultimately she may need a catheterization however unlikely, chest pain is seemingly noncardiac and nonexertional.  D-dimer was negative.

## 2014-07-15 NOTE — Assessment & Plan Note (Signed)
Return for repeat pre-and post bronchodilator spirometry. Nebulizer treatments in the past have not improved shortness of breath and chest tightness, and he suspect there is a psychiatric element here. Ultimately she may need a catheterization however unlikely, chest pain is seemingly noncardiac and nonexertional.  D-dimer was negative.

## 2014-07-15 NOTE — Assessment & Plan Note (Signed)
Improved significantly with low-dose estrogen supplementation in the past but has since discontinued. Continuing to have occasional sweats. Restarting low-dose hormone replacement. Return in one month.

## 2014-07-15 NOTE — Assessment & Plan Note (Signed)
Referral back downstairs to psychiatry, she probably needs a mental status exam.

## 2014-07-15 NOTE — Assessment & Plan Note (Signed)
Overall doing well, continue follow-up with psychiatry

## 2014-07-16 LAB — TSH: TSH: 0.667 u[IU]/mL (ref 0.350–4.500)

## 2014-07-16 LAB — CBC
HCT: 41.5 % (ref 36.0–46.0)
Hemoglobin: 14.1 g/dL (ref 12.0–15.0)
MCH: 31.1 pg (ref 26.0–34.0)
MCHC: 34 g/dL (ref 30.0–36.0)
MCV: 91.6 fL (ref 78.0–100.0)
MPV: 9.4 fL (ref 8.6–12.4)
Platelets: 172 10*3/uL (ref 150–400)
RBC: 4.53 MIL/uL (ref 3.87–5.11)
RDW: 14.4 % (ref 11.5–15.5)
WBC: 4.8 10*3/uL (ref 4.0–10.5)

## 2014-07-16 LAB — SEDIMENTATION RATE: Sed Rate: 1 mm/hr (ref 0–30)

## 2014-07-16 LAB — FOLATE: Folate: 16.1 ng/mL

## 2014-07-16 LAB — RPR

## 2014-07-16 LAB — VITAMIN B12: Vitamin B-12: 467 pg/mL (ref 211–911)

## 2014-07-24 ENCOUNTER — Other Ambulatory Visit: Payer: Self-pay | Admitting: Sports Medicine

## 2014-07-28 ENCOUNTER — Other Ambulatory Visit (HOSPITAL_COMMUNITY): Payer: Self-pay | Admitting: Psychiatry

## 2014-07-28 ENCOUNTER — Other Ambulatory Visit: Payer: Self-pay | Admitting: Sports Medicine

## 2014-07-30 ENCOUNTER — Telehealth (HOSPITAL_COMMUNITY): Payer: Self-pay | Admitting: *Deleted

## 2014-07-30 MED ORDER — TRAZODONE HCL 100 MG PO TABS: ORAL_TABLET | ORAL | Status: DC

## 2014-07-30 NOTE — Telephone Encounter (Signed)
PT called for a refill for Trazodone . Per Dr. Gilmore Laroche, pt is authorized for a refill for Trazodone , Qty 7. PT has a f/u appt on 7/29. PT was informed the refill will only be enough medication until next appt on 7/29. PT verbalizes understanding.

## 2014-07-31 ENCOUNTER — Other Ambulatory Visit (HOSPITAL_COMMUNITY): Payer: Self-pay | Admitting: Psychiatry

## 2014-08-01 ENCOUNTER — Ambulatory Visit (INDEPENDENT_AMBULATORY_CARE_PROVIDER_SITE_OTHER): Payer: BLUE CROSS/BLUE SHIELD | Admitting: Psychiatry

## 2014-08-01 ENCOUNTER — Other Ambulatory Visit (HOSPITAL_COMMUNITY): Payer: Self-pay | Admitting: Psychiatry

## 2014-08-01 ENCOUNTER — Encounter (HOSPITAL_COMMUNITY): Payer: Self-pay | Admitting: Psychiatry

## 2014-08-01 VITALS — BP 112/80 | HR 90 | Ht 65.0 in | Wt 211.0 lb

## 2014-08-01 DIAGNOSIS — F332 Major depressive disorder, recurrent severe without psychotic features: Secondary | ICD-10-CM

## 2014-08-01 DIAGNOSIS — G4700 Insomnia, unspecified: Secondary | ICD-10-CM

## 2014-08-01 DIAGNOSIS — F411 Generalized anxiety disorder: Secondary | ICD-10-CM

## 2014-08-01 DIAGNOSIS — F1021 Alcohol dependence, in remission: Secondary | ICD-10-CM | POA: Diagnosis not present

## 2014-08-01 DIAGNOSIS — M797 Fibromyalgia: Secondary | ICD-10-CM

## 2014-08-01 MED ORDER — TRAZODONE HCL 100 MG PO TABS: ORAL_TABLET | ORAL | Status: DC

## 2014-08-01 NOTE — Progress Notes (Signed)
Patient ID: Jennifer [name], female   DOB: September 25, 1960, 54 y.o.   MRN: [id number]   [facility] Follow-up Outpatient Visit  Jennifer [name] 03/14/1960  Date:06/12/2013  HPI Comments: SUBJECTIVE: [name] is a 54 year old female with a diagnosis of major Depressive Disorder. 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   The patient is referred for psychiatric services for medication management.    . Location; Patient reports she is pain at times because of fibromyalgia that effects her mood.  More so concerned about her physical health. Says her fibromyalgia exacerbates her tiredness. Takes gabapentin . Quality: Patient's not feeling hopeless or helpless. She does not want to change her mood stabilizers but want to increase trazadone. Says she needs 300mg  for sleep. She does use her CPap machine.  No rash or reported side effect. Keeps herself busy during the day.  She is taking gabapentin 800mg  one at night only., also helps her restless leg. No relapse on Alcohol and remains sober.  . Severity: Depression: 7/10 (0=Very depressed; 5=Neutral; 10=Very Happy)  Anxiety- 2/10 (0=no anxiety; 5= moderate/tolerable anxiety; 10= panic attacks)  . Duration" Since age 31.  . Timing: Mood is worse some at times of holidays or in the morning  . Context: Patient reports her main stressor has to do pain and flare-ups of fibromyalgia or coPd..  . Associated signs and symptoms : Denies any recent episodes consistent with mania, particularly decreased need for sleep with increased energy, grandiosity, impulsivity, hyperverbal and pressured speech, or increased productivity. Denies any  recent symptoms consistent with psychosis, particularly auditory or visual hallucinations, thought broadcasting/insertion/withdrawal, or ideas of reference. Also denies excessive worry to the point of physical symptoms as well as any panic attacks. Denies any history of trauma or symptoms consistent  with PTSD such as flashbacks, nightmares, hypervigilance, feelings of numbness or inability to connect with others.   Review of Systems  Constitutional: Negative for fever.  Musculoskeletal: Positive for myalgias.  Skin: Negative for rash.  Neurological: Negative for headaches.  Psychiatric/Behavioral: Negative for suicidal ideas and substance abuse.   Weight 206 lbs. Height 5\' 5"  pulse 90.   Physical Exam  Vitals reviewed.  Constitutional: She appears well-developed and well-nourished. No distress.  Skin: She is not diaphoretic.  Musculoskeletal: Gait & Station: normal Patient leans: N/A   Traumatic Brain Injury: No   Past Medical History: Reviewed.  Past Medical History   Diagnosis  Date   .  Discoid lupus    .  Thyroid disease    .  Perimenopausal    .  Fibromyalgia    .  CFS (chronic fatigue syndrome)    .  Sleep apnea      Allergies: Reviewed.  Allergies   Allergen  Reactions   .  Acetaminophen      Pt states she is unable to take this bc of Hashimoto Thyroid   .  Hydrocodone-Acetaminophen      REACTION: agitation   .  Oxycodone      Hallucinating, sweating, itching    Current Medications: Reviewed.  Current Outpatient Prescriptions on File Prior to Visit  Medication Sig Dispense Refill  . alendronate (FOSAMAX) 70 MG tablet Take 70 mg by mouth once a week. Take with a full glass of water on an empty stomach.    Marland Kitchen atorvastatin (LIPITOR) 40 MG tablet Take 1 tablet (40 mg total) by mouth daily. 30 tablet 1  . beclomethasone (QVAR) 80 MCG/ACT inhaler Inhale 2 puffs into the  lungs 2 (two) times daily. 1 Inhaler 12  . estradiol (ESTRACE) 1 MG tablet Take 1 tablet (1 mg total) by mouth daily. 30 tablet 3  . folic acid (FOLVITE) 1 MG tablet Take 1 tablet (1 mg total) by mouth daily. 90 tablet 3  . gabapentin (NEURONTIN) 800 MG tablet Take 1.5 tablets at night 45 tablet 2  . lamoTRIgine (LAMICTAL) 100 MG tablet Take 1 tablet (100 mg total) by mouth daily. 30 tablet 2   . levothyroxine (SYNTHROID, LEVOTHROID) 150 MCG tablet Take 1 tablet (150 mcg total) by mouth daily. 30 tablet 1  . LINZESS 145 MCG CAPS capsule TAKE 1 CAPSULE BY MOUTH EVERY DAY FOR A WEEK. IF INSUFFICIENT RESPONSE TAKE 2 CAPSULES BY MOUTH EVERY DAY 30 capsule 0  . montelukast (SINGULAIR) 10 MG tablet TAKE 1 TABLET BY MOUTH AT BEDTIME 90 tablet 1  . MYRBETRIQ 50 MG TB24 tablet TAKE 1 TABLET BY MOUTH EVERY DAY 30 tablet 0  . PARoxetine (PAXIL) 40 MG tablet Take 1 tablet (40 mg total) by mouth every morning. 30 tablet 2  . Tapentadol HCl (NUCYNTA) 75 MG TABS Take 50 mg by mouth every 4 (four) hours as needed.     . Vitamin D, Ergocalciferol, (DRISDOL) 50000 UNITS CAPS capsule Take 1 capsule (50,000 Units total) by mouth every 7 (seven) days. Recheck Vitamin D in 3 Months 12 capsule 0  . [DISCONTINUED] mometasone (NASONEX) 50 MCG/ACT nasal spray Place 2 sprays into the nose daily. 17 g 12  . [DISCONTINUED] omeprazole (PRILOSEC) 20 MG capsule     . [DISCONTINUED] oxybutynin (DITROPAN-XL) 5 MG 24 hr tablet Take 1 tablet (5 mg total) by mouth daily. 30 tablet 3  . [DISCONTINUED] sucralfate (CARAFATE) 1 G tablet Take 2 tablets (2 g total) by mouth 2 (two) times daily before a meal. preferbly an hour before eating 120 tablet 6   No current facility-administered medications on file prior to visit.       Family History: Reviewed.  Family History   Problem  Relation  Age of Onset   .  Hypertension  Father    .  Heart disease  Sister  45      heart attack    .  Depression  Brother    .  Cancer  Mother  60      colon    Psychiatric Specialty Exam: General Appearance: Fairly Groomed  Eye Contact::  Good  Speech:  Clear and Coherent and Normal Rate  Volume:  Normal  Mood: euthymic but in distress when in pain  Affect:  Appropriate  Thought Process:  Loose  Orientation:  Full (Time, Place, and Person)  Thought Content:  WDL  Suicidal Thoughts:  No  Homicidal Thoughts:  No  Memory:   Immediate;   Good Recent;   Good Remote;   Good  Judgement:  Good  Insight:  Fair  Psychomotor Activity:  Normal  Concentration:  Fair  Recall:  Poor  Akathisia:  No  Handed:  Right  Fund of knowledge-average to above average  Language-Intact  AIMS (if indicated):     Assets:  Manufacturing systems engineer Desire for Improvement Financial Resources/Insurance Housing Intimacy Leisure Time Physical Health Resilience Social Support Talents/Skills Transportation Vocational/Educational  Language intact   Fund of knowledge is good      Assessment:  Major Depression, Recurrent severe-stable Alcohol Dependence in full sustained remission-stable AXIS I  Major Depression, Recurrent severe, Alcohol Dependence in full sustained remission . Fibromyalgia.  Treatment Plan/Recommendations:   1. Affirm with the patient that the medications are taken as ordered. Patient expressed understanding of how their medications were to be used.  2. Continue the following psychiatric medications as written prior to this appointment with the following changes:  Avoid alcohol and continue abstinenece. Denies craving Gabapentin helps her pain and mood.  Continue current prescription of Paxil, lamictal and trazadone. increse trazadone to  qhs. Prescriptions for trazadone sent. Rest she has for now but can call.   Discussed sleep hygiene. Her  sleep has improved says it is worse in summer. 3. Therapy: brief supportive therapy provided. Discussed psychosocial stressors. Continue current services. More than 50% of the visit was spent on individual therapy/counseling. 4. Risks and benefits, side effects and alternatives discussed with patient, she was given an opportunity to ask questions about his/her medication, illness, and treatment. All current psychiatric medications have been reviewed and discussed with the patient and adjusted as clinically appropriate. The patient has been provided an accurate and  updated list of the medications being now prescribed.  5. Patient told to call clinic if any problems occur. Patient advised to go to ER if she should develop SI/HI, side effects, or if symptoms worsen. Has crisis numbers to call if needed.  6. No labs warranted at this time.  7. The patient was encouraged to keep all PCP and specialty clinic appointments.  8. Patient was instructed to return to clinic in 2-3 month.  9.The patient expressed understanding of the above and agrees with the plan.  10.follow up 3 months.    Thresa Ross, MD

## 2014-08-04 NOTE — Telephone Encounter (Signed)
PT was seen in the clinic on 7/29. Prescription for Trazodone , Qty 90 was sent to [facility].

## 2014-08-27 ENCOUNTER — Other Ambulatory Visit (HOSPITAL_COMMUNITY): Payer: Self-pay | Admitting: Psychiatry

## 2014-08-27 ENCOUNTER — Other Ambulatory Visit: Payer: Self-pay | Admitting: Sports Medicine

## 2014-08-27 NOTE — Telephone Encounter (Signed)
Pt will need to schedule an appt with clinic.

## 2014-09-07 ENCOUNTER — Other Ambulatory Visit: Payer: Self-pay | Admitting: Family Medicine

## 2014-09-24 ENCOUNTER — Other Ambulatory Visit: Payer: Self-pay | Admitting: Sports Medicine

## 2014-10-02 ENCOUNTER — Other Ambulatory Visit (HOSPITAL_COMMUNITY): Payer: Self-pay | Admitting: Psychiatry

## 2014-10-03 NOTE — Telephone Encounter (Signed)
Received fax from [facility] for Trazodone . Per Dr. Gilmore Laroche, pt medication request is denied. Pt will need to schedule appt with office.

## 2014-10-09 ENCOUNTER — Ambulatory Visit (INDEPENDENT_AMBULATORY_CARE_PROVIDER_SITE_OTHER): Payer: BLUE CROSS/BLUE SHIELD | Admitting: Psychiatry

## 2014-10-09 ENCOUNTER — Encounter (HOSPITAL_COMMUNITY): Payer: Self-pay | Admitting: Psychiatry

## 2014-10-09 VITALS — BP 140/90 | HR 81 | Ht 65.0 in | Wt 213.0 lb

## 2014-10-09 DIAGNOSIS — F1021 Alcohol dependence, in remission: Secondary | ICD-10-CM

## 2014-10-09 DIAGNOSIS — M797 Fibromyalgia: Secondary | ICD-10-CM

## 2014-10-09 DIAGNOSIS — G4700 Insomnia, unspecified: Secondary | ICD-10-CM

## 2014-10-09 DIAGNOSIS — F332 Major depressive disorder, recurrent severe without psychotic features: Secondary | ICD-10-CM | POA: Diagnosis not present

## 2014-10-09 DIAGNOSIS — F411 Generalized anxiety disorder: Secondary | ICD-10-CM

## 2014-10-09 MED ORDER — TRAZODONE HCL 100 MG PO TABS: ORAL_TABLET | ORAL | Status: DC

## 2014-10-09 MED ORDER — PAROXETINE HCL 40 MG PO TABS: 40.0000 mg | ORAL_TABLET | ORAL | Status: DC

## 2014-10-09 MED ORDER — LAMOTRIGINE 100 MG PO TABS: 100.0000 mg | ORAL_TABLET | Freq: Every day | ORAL | Status: DC

## 2014-10-09 NOTE — Progress Notes (Signed)
Patient ID: Jennifer [name], female   DOB: [birth year], 54 y.o.   MRN: [id number]   [facility] Follow-up Outpatient Visit  Jennifer [name] 03-12-1960  Date:06/12/2013  HPI Comments: SUBJECTIVE: Jennifer [name] is a 54 year old female with a diagnosis of major Depressive Disorder. 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   The patient is referred for psychiatric services for medication management.    Patient pain upsets her mood and depression. She is diagnosed with fibromyalgia medications. Adjusted and now she is taking baclofen. Her summary was bad hot weather exacerbates her pain and fibromyalgia Patient's not feeling hopeless or helpless. She does not want to change her mood stabilizers and wants to continue also  trazadone. Says she needs  for sleep. She does use her CPap machine.  No rash or reported side effect. Keeps herself busy during the day.  She is taking gabapentin  one at night only., also helps her restless leg. No relapse on Alcohol and remains sober.  . Severity: Depression: 7/10 (0=Very depressed; 5=Neutral; 10=Very Happy)  Anxiety- 2/10 (0=no anxiety; 5= moderate/tolerable anxiety; 10= panic attacks) Modifying factor: her supportive husband.  . Duration" Since age 45.  . Timing: Mood is worse some at times of holidays or in the morning  . Context: Patient reports her main stressor has to do pain and flare-ups of fibromyalgia or coPd..    Review of Systems  Constitutional: Negative for fever.  Cardiovascular: Negative for chest pain.  Musculoskeletal: Positive for myalgias.  Skin: Negative for rash.  Neurological: Negative for tremors and headaches.  Psychiatric/Behavioral: Negative for suicidal ideas and substance abuse.   Weight 206 lbs. Height  pulse 90.   Physical Exam  Vitals reviewed.  Constitutional: She appears well-developed and well-nourished. No distress.  Skin: She is not diaphoretic.  Musculoskeletal: Gait  & Station: normal Patient leans: N/A   Traumatic Brain Injury: No   Past Medical History: Reviewed.  Past Medical History   Diagnosis  Date   .  Discoid lupus    .  Thyroid disease    .  Perimenopausal    .  Fibromyalgia    .  CFS (chronic fatigue syndrome)    .  Sleep apnea      Allergies: Reviewed.  Allergies   Allergen  Reactions   .  Acetaminophen      Pt states she is unable to take this bc of Hashimoto Thyroid   .  Hydrocodone-Acetaminophen      REACTION: agitation   .  Oxycodone      Hallucinating, sweating, itching    Current Medications: Reviewed.  Current Outpatient Prescriptions on File Prior to Visit  Medication Sig Dispense Refill  . alendronate (FOSAMAX) 70 MG tablet Take 70 mg by mouth once a week. Take with a full glass of water on an empty stomach.    Marland Kitchen atorvastatin (LIPITOR) 40 MG tablet TAKE 1 TABLET BY MOUTH DAILY 30 tablet 0  . beclomethasone (QVAR) 80 MCG/ACT inhaler Inhale 2 puffs into the lungs 2 (two) times daily. 1 Inhaler 12  . estradiol (ESTRACE) 1 MG tablet Take 1 tablet (1 mg total) by mouth daily. 30 tablet 3  . folic acid (FOLVITE) 1 MG tablet Take 1 tablet (1 mg total) by mouth daily. 90 tablet 3  . gabapentin (NEURONTIN) 800 MG tablet Take 1.5 tablets at night 45 tablet 2  . levothyroxine (SYNTHROID, LEVOTHROID) 150 MCG tablet TAKE 1 TABLET BY MOUTH DAILY 30 tablet 0  .  LINZESS 145 MCG CAPS capsule TAKE 1 CAPSULE BY MOUTH EVERY DAY FOR A WEEK. IF INSUFFICIENT RESPONSE TAKE 2 CAPSULES BY MOUTH EVERY DAY 30 capsule 0  . montelukast (SINGULAIR) 10 MG tablet TAKE 1 TABLET BY MOUTH AT BEDTIME 90 tablet 1  . MYRBETRIQ 50 MG TB24 tablet TAKE 1 TABLET BY MOUTH EVERY DAY 30 tablet 0  . MYRBETRIQ 50 MG TB24 tablet TAKE 1 TABLET BY MOUTH EVERY DAY 30 tablet 0  . Tapentadol HCl (NUCYNTA) 75 MG TABS Take 100 mg by mouth every 4 (four) hours as needed.     . Vitamin D, Ergocalciferol, (DRISDOL) 50000 UNITS CAPS capsule TAKE 1 CAPSULE BY MOUTH EVERY 7  DAYS. RECHECK VIT.D LEVEL IN 3 MONTHS 12 capsule 0  . [DISCONTINUED] mometasone (NASONEX) 50 MCG/ACT nasal spray Place 2 sprays into the nose daily. 17 g 12  . [DISCONTINUED] omeprazole (PRILOSEC) 20 MG capsule     . [DISCONTINUED] oxybutynin (DITROPAN-XL) 5 MG 24 hr tablet Take 1 tablet (5 mg total) by mouth daily. 30 tablet 3  . [DISCONTINUED] sucralfate (CARAFATE) 1 G tablet Take 2 tablets (2 g total) by mouth 2 (two) times daily before a meal. preferbly an hour before eating 120 tablet 6   No current facility-administered medications on file prior to visit.       Family History: Reviewed.  Family History   Problem  Relation  Age of Onset   .  Hypertension  Father    .  Heart disease  Sister  45      heart attack    .  Depression  Brother    .  Cancer  Mother  17      colon    Psychiatric Specialty Exam: General Appearance: Fairly Groomed  Eye Contact::  Good  Speech:  Clear and Coherent and Normal Rate  Volume:  Normal  Mood: euthymic but in distress when in pain  Affect:  Appropriate  Thought Process:  Loose  Orientation:  Full (Time, Place, and Person)  Thought Content:  WDL  Suicidal Thoughts:  No  Homicidal Thoughts:  No  Memory:  Immediate;   Good Recent;   Good Remote;   Good  Judgement:  Good  Insight:  Fair  Psychomotor Activity:  Normal  Concentration:  Fair  Recall:  Poor  Akathisia:  No  Handed:  Right  Fund of knowledge-average to above average  Language-Intact  AIMS (if indicated):     Assets:  Manufacturing systems engineer Desire for Improvement Financial Resources/Insurance Housing Intimacy Leisure Time Physical Health Resilience Social Support Talents/Skills Transportation Vocational/Educational  Language intact   Fund of knowledge is good      Assessment:  Major Depression, Recurrent severe-stable Alcohol Dependence in full sustained remission-stable AXIS I  Major Depression, Recurrent severe, Alcohol Dependence in full sustained  remission . Fibromyalgia.     Treatment Plan/Recommendations:   1. Affirm with the patient that the medications are taken as ordered. Patient expressed understanding of how their medications were to be used.  2. Continue the following psychiatric medications as written prior to this appointment with the following changes:  Avoid alcohol and continue abstinenece. Denies craving. Sober now for 9 years  Gabapentin helps her pain and mood.  Continue current prescription of Paxil, lamictal and trazadone.trazadone  300mg  qhs. Prescriptions  Sent. .   Discussed sleep hygiene. Her  sleep has improved says it is worse in summer. 3. Therapy: brief supportive therapy provided. Discussed psychosocial stressors. Continue current services. More  than 50% of the visit was spent on individual therapy/counseling and coorcdination of care.  4. Risks and benefits, side effects and alternatives discussed with patient, she was given an opportunity to ask questions about his/her medication, illness, and treatment. All current psychiatric medications have been reviewed and discussed with the patient and adjusted as clinically appropriate. The patient has been provided an accurate and updated list of the medications being now prescribed.  5. Patient told to call clinic if any problems occur. Patient advised to go to ER if she should develop SI/HI, side effects, or if symptoms worsen. Has crisis numbers to call if needed.  6. No labs warranted at this time.  7. The patient was encouraged to keep all PCP and specialty clinic appointments.  8. Patient was instructed to return to clinic in 2-3 month.  9.The patient expressed understanding of the above and agrees with the plan.  10.follow up 3 months.   time spent: 25 minutes   Thresa Ross, MD

## 2014-10-26 ENCOUNTER — Other Ambulatory Visit: Payer: Self-pay | Admitting: Sports Medicine

## 2014-11-03 ENCOUNTER — Ambulatory Visit (INDEPENDENT_AMBULATORY_CARE_PROVIDER_SITE_OTHER): Payer: BLUE CROSS/BLUE SHIELD | Admitting: Sports Medicine

## 2014-11-03 ENCOUNTER — Encounter: Payer: Self-pay | Admitting: Sports Medicine

## 2014-11-03 DIAGNOSIS — N8110 Cystocele, unspecified: Secondary | ICD-10-CM | POA: Diagnosis not present

## 2014-11-03 LAB — POCT URINALYSIS DIPSTICK
Bilirubin, UA: NEGATIVE
Blood, UA: NEGATIVE
Glucose, UA: NEGATIVE
Ketones, UA: NEGATIVE
Leukocytes, UA: NEGATIVE
Nitrite, UA: NEGATIVE
Protein, UA: NEGATIVE
Spec Grav, UA: 1.02
Urobilinogen, UA: 0.2
pH, UA: 5.5

## 2014-11-03 NOTE — Progress Notes (Signed)
  Subjective:    CC: urinary incontinence  HPI: This is a pleasant 54 -year-old female, she has known both urge and stress incontinence, her urge symptoms have been wonderfully controlled with Myrbetriq, unfortunately with Valsalva she still leaks a bit. She has been doing her kegel exercises but has not yet done formal pelvic floor physical therapy.  Past medical history, Surgical history, Family history not pertinant except as noted below, Social history, Allergies, and medications have been entered into the medical record, reviewed, and no changes needed.   Review of Systems: No fevers, chills, night sweats, weight loss, chest pain, or shortness of breath.   Objective:    General: Well Developed, well nourished, and in no acute distress.  Neuro: Alert and oriented x3, extra-ocular muscles intact, sensation grossly intact.  HEENT: Normocephalic, atraumatic, pupils equal round reactive to light, neck supple, no masses, no lymphadenopathy, thyroid nonpalpable.  Skin: Warm and dry, no rashes. Cardiac: Regular rate and rhythm, no murmurs rubs or gallops, no lower extremity edema.  Respiratory: Clear to auscultation bilaterally. Not using accessory muscles, speaking in full sentences.  Impression and Recommendations:   I spent 25 minutes with this patient, greater than 50% was face-to-face time counseling regarding the above diagnoses

## 2014-11-03 NOTE — Assessment & Plan Note (Signed)
Myrbetriq continues to be highly effective for urinary urges, she continues to have some stress-related symptoms and leakage. Checking a urinalysis today. Referral to pelvic floor rehabilitation, and continue Myrbetriq.

## 2014-11-26 ENCOUNTER — Other Ambulatory Visit: Payer: Self-pay | Admitting: Sports Medicine

## 2014-11-26 ENCOUNTER — Other Ambulatory Visit (HOSPITAL_COMMUNITY): Payer: Self-pay | Admitting: Psychiatry

## 2014-12-02 NOTE — Telephone Encounter (Signed)
[facility]. Per Dr. Gilmore LarocheAkhtar, medication request is denied. Pt will need to schedule an appt with the office. L/m for pt to return call to office to schedule appt. Pt was last seen on 10/06/14 with no f/up appt.

## 2014-12-07 ENCOUNTER — Other Ambulatory Visit: Payer: Self-pay | Admitting: Family Medicine

## 2014-12-10 ENCOUNTER — Other Ambulatory Visit (HOSPITAL_COMMUNITY): Payer: Self-pay | Admitting: Psychiatry

## 2014-12-10 ENCOUNTER — Telehealth (HOSPITAL_COMMUNITY): Payer: Self-pay | Admitting: *Deleted

## 2014-12-11 ENCOUNTER — Telehealth: Payer: Self-pay

## 2014-12-11 NOTE — Telephone Encounter (Signed)
Let me take a look at it to determine the type of alopecia.  Set up appt.

## 2014-12-11 NOTE — Telephone Encounter (Signed)
Pt states no longer having issues with initial concern but her hair is falling out and her scalp is sore and would like something for it. Please assist.

## 2014-12-11 NOTE — Telephone Encounter (Signed)
Please schedule patient an appointment  Thank you

## 2014-12-11 NOTE — Telephone Encounter (Signed)
Is she still taking hormone replacement?  Also I can add a cream to start with if she would like.

## 2014-12-11 NOTE — Telephone Encounter (Signed)
Pt called stating that she's "having a funny feeling in her private area" states this is a hormone problem that has been discussed before with you.

## 2014-12-11 NOTE — Telephone Encounter (Signed)
Received medication request for Trazodone 100mg .Pt will need to schedule an appt with the clinic. LVM to schedule f/u appt for medication management.

## 2014-12-11 NOTE — Telephone Encounter (Signed)
Received medication request for Trazodone 100mg . Per Dr. Gilmore LarocheAkhtar, medication request is denied. Pt will need to schedule appt with clinic.

## 2014-12-29 ENCOUNTER — Other Ambulatory Visit: Payer: Self-pay | Admitting: Sports Medicine

## 2015-01-08 ENCOUNTER — Other Ambulatory Visit (HOSPITAL_COMMUNITY): Payer: Self-pay | Admitting: Psychiatry

## 2015-01-13 NOTE — Telephone Encounter (Signed)
[facility] Lam[facility]al 100mg  and Paxil 40mg . Per Dr. Gilmore LarocheAkhtar, medication request is denied. Pt was last seen in 10/2014. Called pt to schedule appt. Pt states she is not feeling well and will call back to schedule appt. Informed pt, no refills will be issued until seen by provider. Pt verbalizes understanding.

## 2015-01-23 ENCOUNTER — Encounter (HOSPITAL_COMMUNITY): Payer: Self-pay | Admitting: Psychiatry

## 2015-01-23 ENCOUNTER — Ambulatory Visit (INDEPENDENT_AMBULATORY_CARE_PROVIDER_SITE_OTHER): Payer: BLUE CROSS/BLUE SHIELD | Admitting: Psychiatry

## 2015-01-23 VITALS — BP 116/70 | HR 96 | Ht 65.0 in | Wt 210.0 lb

## 2015-01-23 DIAGNOSIS — F1021 Alcohol dependence, in remission: Secondary | ICD-10-CM | POA: Diagnosis not present

## 2015-01-23 DIAGNOSIS — M797 Fibromyalgia: Secondary | ICD-10-CM

## 2015-01-23 DIAGNOSIS — F411 Generalized anxiety disorder: Secondary | ICD-10-CM

## 2015-01-23 DIAGNOSIS — F332 Major depressive disorder, recurrent severe without psychotic features: Secondary | ICD-10-CM

## 2015-01-23 DIAGNOSIS — G4700 Insomnia, unspecified: Secondary | ICD-10-CM

## 2015-01-23 MED ORDER — PAROXETINE HCL 40 MG PO TABS: 40.0000 mg | ORAL_TABLET | ORAL | Status: DC

## 2015-01-23 MED ORDER — TRAZODONE HCL 100 MG PO TABS: ORAL_TABLET | ORAL | Status: DC

## 2015-01-23 MED ORDER — LAMOTRIGINE 100 MG PO TABS: 100.0000 mg | ORAL_TABLET | Freq: Every day | ORAL | Status: DC

## 2015-01-23 NOTE — Progress Notes (Signed)
Patient ID: Jennifer [name], female   DOB: March 08, 1960, 55 y.o.   MRN: [id number]   [facility] Follow-up Outpatient Visit  Jennifer [name] [birth year]  Date:  01/23/2015  HPI Comments: SUBJECTIVE: Jennifer [name] is a 55 year old female with a diagnosis of major Depressive Disorder. 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   The patient returns for psychiatric services for medication management.    Patient pain upsets her mood and depression. She is diagnosed with fibromyalgia medications.  Bad or cold weather exacerbates her pain. She has to spend more time in bed due to pain and that affects her sleep at night. She is requesting more sleep. But I cautioned that she needs to stay up sleep specialist was considering she has been diagnosed with mild sleep apnea and she has not followed up  No rash or reported side effect. Keeps herself busy during the day to avoid pain . Modifying factor: supportive husband No relapse on Alcohol and remains sober.  . Severity: Depression: 7/10 (0=Very depressed; 5=Neutral; 10=Very Happy)  Anxiety- 2/10 (0=no anxiety; 5= moderate/tolerable anxiety; 10= panic attacks) Modifying factor: her supportive husband.  . Duration" Since age 30.  . Timing: Mood is worse some at times of holidays or in the morning  . Context: Patient reports her main stressor has to do pain and flare-ups of fibromyalgia or coPd..    Review of Systems  Constitutional: Negative for fever.  Cardiovascular: Negative for chest pain.  Musculoskeletal: Positive for myalgias.  Skin: Negative for rash.  Neurological: Negative for tremors and headaches.  Psychiatric/Behavioral: Negative for suicidal ideas and substance abuse. The patient has insomnia.    Weight 206 lbs. Height  pulse 90.   Physical Exam  Vitals reviewed.  Constitutional: She appears well-developed and well-nourished. No distress.  Skin: She is not diaphoretic.  Musculoskeletal: Gait &  Station: normal Patient leans: N/A   Traumatic Brain Injury: No   Past Medical History: Reviewed.  Past Medical History   Diagnosis  Date   .  Discoid lupus    .  Thyroid disease    .  Perimenopausal    .  Fibromyalgia    .  CFS (chronic fatigue syndrome)    .  Sleep apnea      Allergies: Reviewed.  Allergies   Allergen  Reactions   .  Acetaminophen      Pt states she is unable to take this bc of Hashimoto Thyroid   .  Hydrocodone-Acetaminophen      REACTION: agitation   .  Oxycodone      Hallucinating, sweating, itching    Current Medications: Reviewed.  Current Outpatient Prescriptions on File Prior to Visit  Medication Sig Dispense Refill  . atorvastatin (LIPITOR) 40 MG tablet TAKE 1 TABLET BY MOUTH DAILY 30 tablet 0  . atorvastatin (LIPITOR) 40 MG tablet TAKE 1 TABLET BY MOUTH DAILY 30 tablet 0  . baclofen (LIORESAL) 10 MG tablet TAKE 1 TABLET TWICE DAILY AS NEEDED (REPLACES TIZANIDINE)  0  . beclomethasone (QVAR) 80 MCG/ACT inhaler Inhale 2 puffs into the lungs 2 (two) times daily. 1 Inhaler 12  . folic acid (FOLVITE) 1 MG tablet Take 1 tablet (1 mg total) by mouth daily. 90 tablet 3  . gabapentin (NEURONTIN) 800 MG tablet Take 1.5 tablets at night 45 tablet 2  . levothyroxine (SYNTHROID, LEVOTHROID) 150 MCG tablet TAKE 1 TABLET BY MOUTH DAILY 30 tablet 0  . levothyroxine (SYNTHROID, LEVOTHROID) 150 MCG tablet TAKE  1 TABLET BY MOUTH DAILY 30 tablet 0  . levothyroxine (SYNTHROID, LEVOTHROID) 150 MCG tablet TAKE 1 TABLET BY MOUTH DAILY 30 tablet 0  . montelukast (SINGULAIR) 10 MG tablet TAKE 1 TABLET BY MOUTH AT BEDTIME 90 tablet 1  . MYRBETRIQ 50 MG TB24 tablet TAKE 1 TABLET BY MOUTH EVERY DAY 30 tablet 0  . MYRBETRIQ 50 MG TB24 tablet TAKE 1 TABLET BY MOUTH EVERY DAY 30 tablet 0  . MYRBETRIQ 50 MG TB24 tablet TAKE 1 TABLET BY MOUTH EVERY DAY 30 tablet 0  . Tapentadol HCl (NUCYNTA) 100 MG TABS Take 100 mg by mouth every 6 (six) hours as needed. TAKE 100 MG EVERY 6 HOURS  AS NEEDED    . Vitamin D, Ergocalciferol, (DRISDOL) 50000 UNITS CAPS capsule TAKE 1 CAPSULE BY MOUTH EVERY 7 DAYS. RECHECK VIT.D LEVEL IN 3 MONTHS 12 capsule 0  . atorvastatin (LIPITOR) 40 MG tablet TAKE 1 TABLET BY MOUTH DAILY 30 tablet 0  . [DISCONTINUED] mometasone (NASONEX) 50 MCG/ACT nasal spray Place 2 sprays into the nose daily. 17 g 12  . [DISCONTINUED] omeprazole (PRILOSEC) 20 MG capsule     . [DISCONTINUED] oxybutynin (DITROPAN-XL) 5 MG 24 hr tablet Take 1 tablet (5 mg total) by mouth daily. 30 tablet 3  . [DISCONTINUED] sucralfate (CARAFATE) 1 G tablet Take 2 tablets (2 g total) by mouth 2 (two) times daily before a meal. preferbly an hour before eating 120 tablet 6   No current facility-administered medications on file prior to visit.       Family History: Reviewed.  Family History   Problem  Relation  Age of Onset   .  Hypertension  Father    .  Heart disease  Sister  45      heart attack    .  Depression  Brother    .  Cancer  Mother  39      colon    Psychiatric Specialty Exam: General Appearance: Fairly Groomed  Eye Contact::  Good  Speech:  Clear and Coherent and Normal Rate  Volume:  Normal  Mood: euthymic but in distress when in pain  Affect:  Appropriate  Thought Process:  Loose  Orientation:  Full (Time, Place, and Person)  Thought Content:  WDL  Suicidal Thoughts:  No  Homicidal Thoughts:  No  Memory:  Immediate;   Good Recent;   Good Remote;   Good  Judgement:  Good  Insight:  Fair  Psychomotor Activity:  Normal  Concentration:  Fair  Recall:  Poor  Akathisia:  No  Handed:  Right  Fund of knowledge-average to above average  Language-Intact  AIMS (if indicated):     Assets:  Manufacturing systems engineer Desire for Improvement Financial Resources/Insurance Housing Intimacy Leisure Time Physical Health Resilience Social Support Talents/Skills Transportation Vocational/Educational  Language intact   Fund of knowledge is good       Assessment:  Major Depression, Recurrent severe-stable Alcohol Dependence in full sustained remission-stable AXIS I  Major Depression, Recurrent severe, Alcohol Dependence in full sustained remission . Fibromyalgia.     Treatment Plan/Recommendations:   1. Affirm with the patient that the medications are taken as ordered. Patient expressed understanding of how their medications were to be used.  2. Continue the following psychiatric medications as written prior to this appointment with the following changes:  Avoid alcohol and continue abstinenece. Denies craving. Sober now for 9 years  Follow up with sleep specialist in regard to if adding more sleep aid as  her sleep apnea needs to be re evalutaed first.  Continue current prescription of Paxil, lamictal and trazadone.trazadone   qhs. Prescriptions  Sent. .   Discussed sleep hygiene. Her  sleep has improved says it is worse in summer. 3. Therapy: brief supportive therapy provided. Discussed psychosocial stressors. Continue current services. More than 50% of the visit was spent on individual therapy/counseling and coorcdination of care.  4. Risks and benefits, side effects and alternatives discussed with patient, she was given an opportunity to ask questions about his/her medication, illness, and treatment. All current psychiatric medications have been reviewed and discussed with the patient and adjusted as clinically appropriate. The patient has been provided an accurate and updated list of the medications being now prescribed.  5. Patient told to call clinic if any problems occur. Patient advised to go to ER if she should develop SI/HI, side effects, or if symptoms worsen. Has crisis numbers to call if needed.  6. No labs warranted at this time. Follow up with primary care 7. The patient was encouraged to keep all PCP and specialty clinic appointments.  8. Patient was instructed to return to clinic in 2-3 month.  9.The patient  expressed understanding of the above and agrees with the plan.  10.follow up 3 months.   time spent: 25 minutes   Thresa Ross, MD

## 2015-01-27 ENCOUNTER — Ambulatory Visit (INDEPENDENT_AMBULATORY_CARE_PROVIDER_SITE_OTHER): Payer: BLUE CROSS/BLUE SHIELD | Admitting: Sports Medicine

## 2015-01-27 ENCOUNTER — Encounter: Payer: Self-pay | Admitting: Sports Medicine

## 2015-01-27 DIAGNOSIS — N8110 Cystocele, unspecified: Secondary | ICD-10-CM | POA: Diagnosis not present

## 2015-01-27 LAB — POCT URINALYSIS DIPSTICK
Blood, UA: NEGATIVE
Glucose, UA: NEGATIVE
Leukocytes, UA: NEGATIVE
Nitrite, UA: NEGATIVE
Spec Grav, UA: >=1.03
Urobilinogen, UA: 0.2
pH, UA: 5.5

## 2015-01-27 NOTE — Addendum Note (Signed)
Addended by: Baird Kay on: 01/27/2015 03:10 PM   Modules accepted: Orders

## 2015-01-27 NOTE — Assessment & Plan Note (Signed)
Exam is overall unremarkable. She does have a bit of bladder prolapse. She is post-hysterectomy and bladder sling. Obtain urinalysis, further follow-up with OB/GYN.

## 2015-01-27 NOTE — Progress Notes (Signed)
  Subjective:    CC: vaginal discomfort  HPI: For the past several days this pleasant 55 year old female who is post-hysterectomy and bladder sling has had a sensation of fullness and swelling in her genital region, no dysuria, minimal suprapubic pain, no constitutional symptoms, no vaginal discharge.  Past medical history, Surgical history, Family history not pertinant except as noted below, Social history, Allergies, and medications have been entered into the medical record, reviewed, and no changes needed.   Review of Systems: No fevers, chills, night sweats, weight loss, chest pain, or shortness of breath.   Objective:    General: Well Developed, well nourished, and in no acute distress.  Neuro: Alert and oriented x3, extra-ocular muscles intact, sensation grossly intact.  HEENT: Normocephalic, atraumatic, pupils equal round reactive to light, neck supple, no masses, no lymphadenopathy, thyroid nonpalpable.  Skin: Warm and dry, no rashes. Cardiac: Regular rate and rhythm, no murmurs rubs or gallops, no lower extremity edema.  Respiratory: Clear to auscultation bilaterally. Not using accessory muscles, speaking in full sentences. Pelvic: Vulva, vault, cervix, unremarkable. No palpable adnexal masses, minimal suprapubic tenderness. No guarding, no rigidity. No discharge.  Possibly some mild bladder prolapse at the anterior wall.  Impression and Recommendations:

## 2015-01-28 ENCOUNTER — Other Ambulatory Visit: Payer: Self-pay | Admitting: Sports Medicine

## 2015-02-06 ENCOUNTER — Other Ambulatory Visit: Payer: Self-pay

## 2015-02-06 MED ORDER — GABAPENTIN 800 MG PO TABS: ORAL_TABLET | ORAL | Status: DC

## 2015-02-06 NOTE — Telephone Encounter (Signed)
Pt has a history of RLS and would like to know if you would be willing to refill this for her as well as put her in a referral to a specialist.

## 2015-02-12 ENCOUNTER — Ambulatory Visit (INDEPENDENT_AMBULATORY_CARE_PROVIDER_SITE_OTHER): Payer: BLUE CROSS/BLUE SHIELD | Admitting: Obstetrics & Gynecology

## 2015-02-12 ENCOUNTER — Encounter: Payer: Self-pay | Admitting: Obstetrics & Gynecology

## 2015-02-12 VITALS — BP 151/88 | HR 101 | Resp 16 | Ht 65.0 in | Wt 211.0 lb

## 2015-02-12 DIAGNOSIS — N3945 Continuous leakage: Secondary | ICD-10-CM | POA: Diagnosis not present

## 2015-02-12 DIAGNOSIS — R102 Pelvic and perineal pain: Secondary | ICD-10-CM | POA: Diagnosis not present

## 2015-02-12 DIAGNOSIS — R32 Unspecified urinary incontinence: Secondary | ICD-10-CM | POA: Diagnosis not present

## 2015-02-12 NOTE — Progress Notes (Signed)
   Subjective:    Patient ID: Jennifer [name], female    DOB: Nov 20, 1960, 55 y.o.   MRN: [id number]  HPI 55 yo WW is here with the issue of some vaginal pain for the last few years. She was referred here by Dr.Thekkacandam due to this and some mild cystocele. She had a hysterectomy and sling in 2009 in [place] (She cannot remember her doctor's name). 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   Review of Systems She is abstinent.    Objective:   Physical Exam [id number] Abd- benign, obese EG- mild atrophy, no lesions Cuff with good support, minimal cystocele and rectocele with strong Valsalva No palpable mesh No masses palpable with bimanual exam       Assessment & Plan:  Incontinence/vaginal pain- refer to urology

## 2015-03-01 ENCOUNTER — Other Ambulatory Visit: Payer: Self-pay | Admitting: Sports Medicine

## 2015-03-02 ENCOUNTER — Other Ambulatory Visit: Payer: Self-pay | Admitting: *Deleted

## 2015-03-02 DIAGNOSIS — E785 Hyperlipidemia, unspecified: Secondary | ICD-10-CM

## 2015-03-02 NOTE — Telephone Encounter (Signed)
Needs lipid panel before future refill, hasn't had labs since 2014

## 2015-03-02 NOTE — Telephone Encounter (Signed)
Needs lipid panel; no future refills until labs draws. Has not had lipids checked since 2014

## 2015-03-05 ENCOUNTER — Other Ambulatory Visit: Payer: Self-pay | Admitting: Family Medicine

## 2015-03-16 ENCOUNTER — Ambulatory Visit (INDEPENDENT_AMBULATORY_CARE_PROVIDER_SITE_OTHER): Payer: BLUE CROSS/BLUE SHIELD | Admitting: Sports Medicine

## 2015-03-16 VITALS — BP 123/80 | HR 83 | Resp 18 | Wt 212.5 lb

## 2015-03-16 DIAGNOSIS — M5412 Radiculopathy, cervical region: Secondary | ICD-10-CM

## 2015-03-16 DIAGNOSIS — H8110 Benign paroxysmal vertigo, unspecified ear: Secondary | ICD-10-CM | POA: Insufficient documentation

## 2015-03-16 DIAGNOSIS — H8113 Benign paroxysmal vertigo, bilateral: Secondary | ICD-10-CM | POA: Diagnosis not present

## 2015-03-16 DIAGNOSIS — G2581 Restless legs syndrome: Secondary | ICD-10-CM | POA: Diagnosis not present

## 2015-03-16 MED ORDER — SCOPOLAMINE 1 MG/3DAYS TD PT72: 1.0000 | MEDICATED_PATCH | TRANSDERMAL | Status: DC

## 2015-03-16 MED ORDER — ROPINIROLE HCL 1 MG PO TABS: 1.0000 mg | ORAL_TABLET | Freq: Every day | ORAL | Status: DC

## 2015-03-16 NOTE — Assessment & Plan Note (Signed)
Adding Requip

## 2015-03-16 NOTE — Progress Notes (Signed)
  Subjective:    CC: Follow-up  HPI: Benign positional vertigo: Worsened with head movements, agreeable to do vestibular rehabilitation. No presyncopal symptoms.  Cervical degenerative disc disease: Persistent pain, proceeding with a right-sided C6-C7 interlaminar epidural.  Restless leg syndrome: Insufficient control with gabapentin.  Past medical history, Surgical history, Family history not pertinant except as noted below, Social history, Allergies, and medications have been entered into the medical record, reviewed, and no changes needed.   Review of Systems: No fevers, chills, night sweats, weight loss, chest pain, or shortness of breath.   Objective:    General: Well Developed, well nourished, and in no acute distress.  Neuro: Alert and oriented x3, extra-ocular muscles intact, sensation grossly intact.  HEENT: Normocephalic, atraumatic, pupils equal round reactive to light, neck supple, no masses, no lymphadenopathy, thyroid nonpalpable.  Skin: Warm and dry, no rashes. Cardiac: Regular rate and rhythm, no murmurs rubs or gallops, no lower extremity edema.  Respiratory: Clear to auscultation bilaterally. Not using accessory muscles, speaking in full sentences.  Impression and Recommendations:    I spent 25 minutes with this patient, greater than 50% was face-to-face time counseling regarding the above diagnoses

## 2015-03-16 NOTE — Assessment & Plan Note (Signed)
Vestibular rehabilitation, scopolamine patch

## 2015-03-16 NOTE — Assessment & Plan Note (Signed)
Did well after an epidural in 2014, repeat epidural

## 2015-03-22 ENCOUNTER — Other Ambulatory Visit (HOSPITAL_COMMUNITY): Payer: Self-pay | Admitting: Psychiatry

## 2015-03-23 NOTE — Telephone Encounter (Signed)
Received medication request from pharmacy for refill for Trazodone 100mg , #90. Per Dr. Gilmore LarocheAkhtar, pt is authorized for a refill for Trazodone 100mg , #90. Prescription was sent to pharmacy. Pt is schedule for a f/u appt on 04/23/15. Called and informed pt of prescription status. Pt verbalizes understanding.

## 2015-03-29 ENCOUNTER — Other Ambulatory Visit: Payer: Self-pay | Admitting: Sports Medicine

## 2015-03-30 ENCOUNTER — Telehealth: Payer: Self-pay

## 2015-03-30 DIAGNOSIS — G2581 Restless legs syndrome: Secondary | ICD-10-CM

## 2015-03-30 MED ORDER — ROPINIROLE HCL 3 MG PO TABS: 3.0000 mg | ORAL_TABLET | Freq: Every day | ORAL | Status: DC

## 2015-03-30 NOTE — Telephone Encounter (Signed)
We are at a low dose, increase to 3 mg at bedtime, take three 1mg  pills until she runs out and I have called in new 3 mg pills

## 2015-03-30 NOTE — Telephone Encounter (Signed)
Pt left VM stating Requip is not helping with RLS please advise.

## 2015-03-30 NOTE — Telephone Encounter (Signed)
Pt states that medication actually makes her feel worse at night. Pt also asked if you saw the other medications she takes at bedtime. States she will try the 3mg  and will let us know how it works. Please advise.

## 2015-03-31 NOTE — Telephone Encounter (Signed)
If 3 milligrams doesn't work then we will try Mirapex.

## 2015-03-31 NOTE — Telephone Encounter (Signed)
Informed of information and will try the 3 mg and give us a call if it does not work.

## 2015-04-16 ENCOUNTER — Other Ambulatory Visit (HOSPITAL_COMMUNITY): Payer: Self-pay | Admitting: Psychiatry

## 2015-04-16 ENCOUNTER — Other Ambulatory Visit: Payer: Self-pay | Admitting: Sports Medicine

## 2015-04-20 NOTE — Telephone Encounter (Signed)
[facility] . Per Dr. Gilmore LarocheAkhtar, medication request for Paxil 40mg , #30 is authorized for refill. Rx was sent to pharmacy. Pt is schedule for a f/u appt on 04/23/15. Called and informed pt of rx status. Pt verbalizes understanding.

## 2015-04-21 ENCOUNTER — Other Ambulatory Visit (HOSPITAL_COMMUNITY): Payer: Self-pay | Admitting: Psychiatry

## 2015-04-21 DIAGNOSIS — Z79891 Long term (current) use of opiate analgesic: Secondary | ICD-10-CM | POA: Diagnosis not present

## 2015-04-23 ENCOUNTER — Ambulatory Visit (HOSPITAL_COMMUNITY): Payer: Self-pay | Admitting: Psychiatry

## 2015-04-23 NOTE — Telephone Encounter (Signed)
[facility]ills on Lamotrigine and Trazodone. Per Dr.Akhtar, medication requests for Lamotrigine 100mg , #30 and Trazodone 100mg , #90 are authorized. RX were sent to pharmacy. Called and informed pt of rx status. Pt verbalizes understanding.

## 2015-04-26 ENCOUNTER — Other Ambulatory Visit: Payer: Self-pay | Admitting: Sports Medicine

## 2015-05-06 ENCOUNTER — Other Ambulatory Visit: Payer: Self-pay | Admitting: Sports Medicine

## 2015-05-20 ENCOUNTER — Other Ambulatory Visit (HOSPITAL_COMMUNITY): Payer: Self-pay | Admitting: Psychiatry

## 2015-05-20 ENCOUNTER — Other Ambulatory Visit: Payer: Self-pay | Admitting: Sports Medicine

## 2015-05-20 NOTE — Telephone Encounter (Signed)
[facility], Trazodone, and Lamictal. Per Dr. Gilmore LarocheAkhtar, medication request is denied. Pt will need to schedule appt with clinic. LVM for pt to return call to clinic.

## 2015-05-27 ENCOUNTER — Other Ambulatory Visit: Payer: Self-pay | Admitting: Sports Medicine

## 2015-05-30 ENCOUNTER — Other Ambulatory Visit: Payer: Self-pay | Admitting: Sports Medicine

## 2015-06-02 ENCOUNTER — Other Ambulatory Visit: Payer: Self-pay | Admitting: Sports Medicine

## 2015-06-16 ENCOUNTER — Other Ambulatory Visit: Payer: Self-pay | Admitting: Sports Medicine

## 2015-06-17 ENCOUNTER — Other Ambulatory Visit (HOSPITAL_COMMUNITY): Payer: Self-pay | Admitting: Psychiatry

## 2015-06-19 ENCOUNTER — Other Ambulatory Visit (HOSPITAL_COMMUNITY): Payer: Self-pay | Admitting: Psychiatry

## 2015-06-22 ENCOUNTER — Ambulatory Visit: Payer: Self-pay | Admitting: Sports Medicine

## 2015-06-22 NOTE — Telephone Encounter (Signed)
Return telephone to patient. Pt would like a refill for Paxil and Lamictal. Per Dr. Gilmore LarocheAkhtar, medication request is denied. Pt will need to schedule with clinic before refills are issued. Pt was last seen on 01/23/15, cancel appt on 04/23/15. Pt was offered an earlier appt, but decline and wanted to wait until 6/20. Informed pt of refill status and explain to pt why refills were not issued. Pt states she will be at her appt on 6/20.

## 2015-06-23 ENCOUNTER — Ambulatory Visit (INDEPENDENT_AMBULATORY_CARE_PROVIDER_SITE_OTHER): Payer: BLUE CROSS/BLUE SHIELD | Admitting: Psychiatry

## 2015-06-23 ENCOUNTER — Encounter (HOSPITAL_COMMUNITY): Payer: Self-pay | Admitting: Psychiatry

## 2015-06-23 VITALS — BP 128/72 | HR 99 | Ht 65.0 in | Wt 204.0 lb

## 2015-06-23 DIAGNOSIS — G4700 Insomnia, unspecified: Secondary | ICD-10-CM | POA: Diagnosis not present

## 2015-06-23 DIAGNOSIS — F332 Major depressive disorder, recurrent severe without psychotic features: Secondary | ICD-10-CM

## 2015-06-23 DIAGNOSIS — F411 Generalized anxiety disorder: Secondary | ICD-10-CM

## 2015-06-23 DIAGNOSIS — M797 Fibromyalgia: Secondary | ICD-10-CM | POA: Diagnosis not present

## 2015-06-23 MED ORDER — TRAZODONE HCL 100 MG PO TABS: ORAL_TABLET | ORAL | Status: DC

## 2015-06-23 MED ORDER — PAROXETINE HCL 40 MG PO TABS: ORAL_TABLET | ORAL | Status: DC

## 2015-06-23 NOTE — Progress Notes (Signed)
Patient ID: Jennifer [name], female   DOB: 09/26/1960, 55 y.o.   MRN: [id number]   [facility] Follow-up Outpatient Visit  MERCADIES CO 03/24/1960  Date:  01/23/2015  HPI Comments: SUBJECTIVE: Jennifer [name] is a 55 year old female with a diagnosis of major Depressive Disorder. 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   The patient returns for psychiatric services and  medication management.    Continues to suffer from pain and fibromyalgia that exacerbates her mood but overall emotionally she is handling things well she has difficulty going out because of fatigue and also wants to avoid people or large crowds Apparently she ran low on Paxil and Lamictal for the last 1 week and is not taking it she does not feel that she has gone any worse and wants to consider at least stopping or cutting down one of the above medication Bad or cold weather exacerbates her pain.  Has not followed with sleep dr regard cpap and use or change in mask.  No rash or reported side effect. Keeps herself busy during the day to avoid pain . Modifying factor: supportive husband No relapse on Alcohol and remains sober.  . Severity: Depression: 7/10 (0=Very depressed; 5=Neutral; 10=Very Happy)  Anxiety- 2/10 (0=no anxiety; 5= moderate/tolerable anxiety; 10= panic attacks) Modifying factor: her supportive husband.  . Duration" Since age 2.  . Timing: Mood is worse some at times of holidays or in the morning  . Context: Patient reports her main stressor has to do pain and flare-ups of fibromyalgia or coPd..    Review of Systems  Constitutional: Negative for fever.  Cardiovascular: Negative for chest pain and palpitations.  Musculoskeletal: Positive for myalgias.  Skin: Negative for rash.  Neurological: Negative for tremors and headaches.  Psychiatric/Behavioral: Negative for suicidal ideas and substance abuse. The patient has insomnia.    Weight 206 lbs. Height 5\' 5"  pulse  90.   Physical Exam  Vitals reviewed.  Constitutional: She appears well-developed and well-nourished. No distress.  Skin: She is not diaphoretic.  Musculoskeletal: Gait & Station: normal Patient leans: N/A   Traumatic Brain Injury: No   Past Medical History: Reviewed.  Past Medical History   Diagnosis  Date   .  Discoid lupus    .  Thyroid disease    .  Perimenopausal    .  Fibromyalgia    .  CFS (chronic fatigue syndrome)    .  Sleep apnea      Allergies: Reviewed.  Allergies   Allergen  Reactions   .  Acetaminophen      Pt states she is unable to take this bc of Hashimoto Thyroid   .  Hydrocodone-Acetaminophen      REACTION: agitation   .  Oxycodone      Hallucinating, sweating, itching    Current Medications: Reviewed.  Current Outpatient Prescriptions on File Prior to Visit  Medication Sig Dispense Refill  . atorvastatin (LIPITOR) 40 MG tablet TAKE 1 TABLET BY MOUTH DAILY 30 tablet 0  . beclomethasone (QVAR) 80 MCG/ACT inhaler Inhale 2 puffs into the lungs 2 (two) times daily. 1 Inhaler 12  . gabapentin (NEURONTIN) 800 MG tablet TAKE 1 AND 1/2 TABLETS BY MOUTH AT NIGHT 45 tablet 0  . lamoTRIgine (LAMICTAL) 100 MG tablet TAKE 1 TABLET(100 MG) BY MOUTH DAILY 30 tablet 0  . levothyroxine (SYNTHROID, LEVOTHROID) 150 MCG tablet TAKE 1 TABLET BY MOUTH DAILY 30 tablet 0  . levothyroxine (SYNTHROID, LEVOTHROID) 150 MCG tablet TAKE  1 TABLET BY MOUTH DAILY 30 tablet 0  . levothyroxine (SYNTHROID, LEVOTHROID) 150 MCG tablet TAKE 1 TABLET BY MOUTH DAILY 30 tablet 0  . levothyroxine (SYNTHROID, LEVOTHROID) 150 MCG tablet TAKE 1 TABLET BY MOUTH DAILY 30 tablet 0  . montelukast (SINGULAIR) 10 MG tablet TAKE 1 TABLET BY MOUTH AT BEDTIME 90 tablet 1  . MYRBETRIQ 50 MG TB24 tablet TAKE 1 TABLET BY MOUTH EVERY DAY 30 tablet 0  . MYRBETRIQ 50 MG TB24 tablet TAKE 1 TABLET BY MOUTH EVERY DAY 30 tablet 0  . MYRBETRIQ 50 MG TB24 tablet TAKE 1 TABLET BY MOUTH EVERY DAY 30 tablet 0  .  MYRBETRIQ 50 MG TB24 tablet TAKE 1 TABLET BY MOUTH EVERY DAY 30 tablet 0  . MYRBETRIQ 50 MG TB24 tablet TAKE 1 TABLET BY MOUTH EVERY DAY 30 tablet 0  . rOPINIRole (REQUIP) 3 MG tablet Take 1 tablet (3 mg total) by mouth at bedtime. 30 tablet 3  . scopolamine (TRANSDERM-SCOP, 1.5 MG,) 1 MG/3DAYS Place 1 patch (1.5 mg total) onto the skin every 3 (three) days. 4 patch 3  . Tapentadol HCl (NUCYNTA) 100 MG TABS Take 100 mg by mouth every 6 (six) hours as needed. TAKE 100 MG EVERY 6 HOURS AS NEEDED    . tiZANidine (ZANAFLEX) 4 MG tablet TK 1 T PO QID PRN  1  . [DISCONTINUED] mometasone (NASONEX) 50 MCG/ACT nasal spray Place 2 sprays into the nose daily. 17 g 12  . [DISCONTINUED] omeprazole (PRILOSEC) 20 MG capsule     . [DISCONTINUED] oxybutynin (DITROPAN-XL) 5 MG 24 hr tablet Take 1 tablet (5 mg total) by mouth daily. 30 tablet 3  . [DISCONTINUED] sucralfate (CARAFATE) 1 G tablet Take 2 tablets (2 g total) by mouth 2 (two) times daily before a meal. preferbly an hour before eating 120 tablet 6   No current facility-administered medications on file prior to visit.       Family History: Reviewed.  Family History   Problem  Relation  Age of Onset   .  Hypertension  Father    .  Heart disease  Sister  45      heart attack    .  Depression  Brother    .  Cancer  Mother  9      colon    Psychiatric Specialty Exam: General Appearance: Fairly Groomed  Eye Contact::  Good  Speech:  Clear and Coherent and Normal Rate  Volume:  Normal  Mood: euthymic but in distress when in pain  Affect:  Appropriate  Thought Process:  Loose  Orientation:  Full (Time, Place, and Person)  Thought Content:  WDL  Suicidal Thoughts:  No  Homicidal Thoughts:  No  Memory:  Immediate;   Good Recent;   Good Remote;   Good  Judgement:  Good  Insight:  Fair  Psychomotor Activity:  Normal  Concentration:  Fair  Recall:  Poor  Akathisia:  No  Handed:  Right  Fund of knowledge-average to above average   Language-Intact  AIMS (if indicated):     Assets:  Manufacturing systems engineer Desire for Improvement Financial Resources/Insurance Housing Intimacy Leisure Time Physical Health Resilience Social Support Talents/Skills Transportation Vocational/Educational  Language intact   Fund of knowledge is good      Assessment:  Major Depression, Recurrent severe-stable Alcohol Dependence in full sustained remission-stable AXIS I  Major Depression, Recurrent severe, Alcohol Dependence in full sustained remission . Fibromyalgia.     Treatment Plan/Recommendations:   1.  Affirm with the patient that the medications are taken as ordered. Patient expressed understanding of how their medications were to be used.  2. Continue the following psychiatric medications as written prior to this appointment with the following changes:  Avoid alcohol and continue abstinenece. Denies craving. Sober now for 9 years  Follow up with sleep specialist in regard to if adding more sleep aid as her sleep apnea needs to be re evalutaed first.  Restart Paxil 20mg  increase back to 40mg  for depression and fibromyalgia  continue trazadone  300mg  qhs for insomnia. Prescriptions  Sent. .    Discussed sleep hygiene. Hold off from starting lamictal for now . Says will call if she wants the need.  3. Therapy: brief supportive therapy provided. Discussed psychosocial stressors. Continue current services. More than 50% of the visit was spent on individual therapy/counseling and coorcdination of care.  4. Risks and benefits, side effects and alternatives discussed with patient, she was given an opportunity to ask questions about his/her medication, illness, and treatment. All current psychiatric medications have been reviewed and discussed with the patient and adjusted as clinically appropriate. The patient has been provided an accurate and updated list of the medications being now prescribed.  5. Patient told to call clinic if  any problems occur. Patient advised to go to ER if she should develop SI/HI, side effects, or if symptoms worsen. Has crisis numbers to call if needed.  6. No labs warranted at this time. Follow up with primary care 7. The patient was encouraged to keep all PCP and specialty clinic appointments.  8. Patient was instructed to return to clinic in 2-3 month.  9.The patient expressed understanding of the above and agrees with the plan.  10.follow up 3 months.   time spent: 25 minutes   Thresa RossAKHTAR, Liz Pinho, MD

## 2015-06-24 NOTE — Telephone Encounter (Signed)
Pt was seen in clinic on 6/20.

## 2015-07-01 ENCOUNTER — Other Ambulatory Visit: Payer: Self-pay | Admitting: Sports Medicine

## 2015-07-02 ENCOUNTER — Telehealth (HOSPITAL_COMMUNITY): Payer: Self-pay | Admitting: *Deleted

## 2015-07-02 MED ORDER — LAMOTRIGINE 100 MG PO TABS: 100.0000 mg | ORAL_TABLET | Freq: Every day | ORAL | Status: DC

## 2015-07-02 NOTE — Telephone Encounter (Signed)
Pt called for a refill for Lamictal 100mg , #30. Per Dr. Gilmore LarocheAkhtar, refill is authorized. Rx was sent to pharmacy. Pt is schedule for a f/u appt on 9/12. Called and informed pt of rx status. Pt verbalizes understanding.

## 2015-07-14 ENCOUNTER — Other Ambulatory Visit: Payer: Self-pay | Admitting: Sports Medicine

## 2015-07-23 DIAGNOSIS — M47816 Spondylosis without myelopathy or radiculopathy, lumbar region: Secondary | ICD-10-CM | POA: Diagnosis not present

## 2015-07-23 DIAGNOSIS — G8929 Other chronic pain: Secondary | ICD-10-CM | POA: Diagnosis not present

## 2015-07-23 DIAGNOSIS — M4302 Spondylolysis, cervical region: Secondary | ICD-10-CM | POA: Diagnosis not present

## 2015-07-23 DIAGNOSIS — Z79891 Long term (current) use of opiate analgesic: Secondary | ICD-10-CM | POA: Diagnosis not present

## 2015-07-29 ENCOUNTER — Other Ambulatory Visit: Payer: Self-pay | Admitting: Sports Medicine

## 2015-07-29 ENCOUNTER — Telehealth: Payer: Self-pay | Admitting: Sports Medicine

## 2015-07-29 NOTE — Telephone Encounter (Signed)
Called patient and informed her to schedule her CPE and she states she will call back

## 2015-08-16 ENCOUNTER — Other Ambulatory Visit: Payer: Self-pay | Admitting: Sports Medicine

## 2015-08-16 ENCOUNTER — Other Ambulatory Visit (HOSPITAL_COMMUNITY): Payer: Self-pay | Admitting: Psychiatry

## 2015-08-17 ENCOUNTER — Other Ambulatory Visit (HOSPITAL_COMMUNITY): Payer: Self-pay | Admitting: Psychiatry

## 2015-08-17 NOTE — Telephone Encounter (Signed)
[facility] from [facility] requesting a refill for Paxil. Per Dr. Gilmore LarocheAkhtar, refill request is denied. Refill has been request too early. Medication refill for Paxil 40mg , #30 w/ 1 refill was sent to pharmacy on 06/23/15. Called and informed pt of refill status. Pt shows understanding. Pt is schedule for a f/u appt on 09/15/15.

## 2015-08-17 NOTE — Telephone Encounter (Signed)
Received fax from [facility] requesting a refill for Trazodone. Per Dr. Gilmore LarocheAkhtar, refill request is denied. Refill has been request too early. Medication refill for Trazodone 100mg , #90 w/ 1 refill was sent to pharmacy on 06/23/15. Called and informed pt of refill status. Pt shows understanding. Pt is schedule for a f/u appt on 09/15/15.

## 2015-08-30 ENCOUNTER — Other Ambulatory Visit (HOSPITAL_COMMUNITY): Payer: Self-pay | Admitting: Psychiatry

## 2015-08-30 ENCOUNTER — Other Ambulatory Visit: Payer: Self-pay | Admitting: Sports Medicine

## 2015-08-31 ENCOUNTER — Other Ambulatory Visit: Payer: Self-pay | Admitting: Sports Medicine

## 2015-09-02 NOTE — Telephone Encounter (Signed)
Pt called for a refill for Lamictal 100mg, #30. Per Dr. Akhtar, refill is authorized. Rx was sent to pharmacy. Pt is schedule for a f/u appt on 9/12. Called and informed pt of rx status. Pt verbalizes understanding.  

## 2015-09-03 ENCOUNTER — Other Ambulatory Visit (HOSPITAL_COMMUNITY): Payer: Self-pay | Admitting: *Deleted

## 2015-09-03 MED ORDER — PAROXETINE HCL 40 MG PO TABS: ORAL_TABLET | ORAL | 0 refills | Status: DC

## 2015-09-03 NOTE — Telephone Encounter (Signed)
Pt called to request a refill for Paxil 40mg . Per Dr. Gilmore LarocheAkhtar, pt is authorized for a refill for Paxil 40mg , #30. Rx was escrib[facility] to [facility]. Pt f/u appt is schedule on 9/12. Called and informed pt of refill status. PT verbalizes understanding.

## 2015-09-15 ENCOUNTER — Encounter (HOSPITAL_COMMUNITY): Payer: Self-pay | Admitting: Psychiatry

## 2015-09-15 ENCOUNTER — Ambulatory Visit (INDEPENDENT_AMBULATORY_CARE_PROVIDER_SITE_OTHER): Payer: BLUE CROSS/BLUE SHIELD | Admitting: Psychiatry

## 2015-09-15 ENCOUNTER — Other Ambulatory Visit: Payer: Self-pay | Admitting: Sports Medicine

## 2015-09-15 VITALS — BP 128/70 | HR 97 | Resp 14 | Ht 65.0 in | Wt 206.0 lb

## 2015-09-15 DIAGNOSIS — M797 Fibromyalgia: Secondary | ICD-10-CM

## 2015-09-15 DIAGNOSIS — G4700 Insomnia, unspecified: Secondary | ICD-10-CM

## 2015-09-15 DIAGNOSIS — F332 Major depressive disorder, recurrent severe without psychotic features: Secondary | ICD-10-CM

## 2015-09-15 DIAGNOSIS — F411 Generalized anxiety disorder: Secondary | ICD-10-CM

## 2015-09-15 MED ORDER — PAROXETINE HCL 40 MG PO TABS
ORAL_TABLET | ORAL | 1 refills | Status: DC
Start: 2015-09-15 — End: 2015-11-19

## 2015-09-15 MED ORDER — LAMOTRIGINE 100 MG PO TABS: 100.0000 mg | ORAL_TABLET | Freq: Every day | ORAL | 1 refills | Status: DC

## 2015-09-15 MED ORDER — TRAZODONE HCL 100 MG PO TABS: ORAL_TABLET | ORAL | 1 refills | Status: DC

## 2015-09-15 NOTE — Progress Notes (Signed)
Patient ID: Jennifer [name], female   DOB: [birth year], 55 y.o.   MRN: [id number]   [facility] Follow-up Outpatient Visit  Jennifer [name] 1960-08-21  Date:  01/23/2015  HPI Comments: SUBJECTIVE: Jennifer [name] is a 55 year old female with a diagnosis of major Depressive Disorder. 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   The patient returns for psychiatric services and  medication management.    Continues to suffer from pain and fibromyalgia that exacerbates her mood but overall emotionally last visit she did not want to start on Lamictal but apparently she did start Lamictal and forgot to take Paxil she started feeling down and depressed and started back on Paxil mood wise she does a reasonable as long as she is able to control her pain but then she mentions her aches and pain including her sleep problems adds up to her mood symptoms. She is also having restless legs that affects her sleep.   Bad or cold weather exacerbates her pain.  Has not followed with sleep dr regard cpap and use or change in mask.  No rash or reported side effect. Keeps herself busy during the day to avoid pain . Modifying factor: supportive husband No relapse on Alcohol and remains sober.  . Severity: Depression: 7/10 (0=Very depressed; 5=Neutral; 10=Very Happy)  Anxiety- 2/10 (0=no anxiety; 5= moderate/tolerable anxiety; 10= panic attacks) Modifying factor: her supportive husband.  . Duration" Since age 89.  . Timing: Mood is worse some at times of holidays or in the morning  . Context: Patient reports her main stressor has to do pain and flare-ups of fibromyalgia or coPd..    Review of Systems  Constitutional: Negative for fever.  Cardiovascular: Negative for chest pain and palpitations.  Musculoskeletal: Positive for myalgias.  Skin: Negative for rash.  Neurological: Negative for tremors and headaches.  Psychiatric/Behavioral: Positive for depression. Negative for substance abuse  and suicidal ideas. The patient has insomnia.    Weight 206 lbs. Height 5\' 5"  pulse 90.   Physical Exam  Vitals reviewed.  Constitutional: She appears well-developed and well-nourished. No distress.  Skin: She is not diaphoretic.  Musculoskeletal: Gait & Station: normal Patient leans: N/A   Traumatic Brain Injury: No   Past Medical History: Reviewed.  Past Medical History   Diagnosis  Date   .  Discoid lupus    .  Thyroid disease    .  Perimenopausal    .  Fibromyalgia    .  CFS (chronic fatigue syndrome)    .  Sleep apnea      Allergies: Reviewed.  Allergies   Allergen  Reactions   .  Acetaminophen      Pt states she is unable to take this bc of Hashimoto Thyroid   .  Hydrocodone-Acetaminophen      REACTION: agitation   .  Oxycodone      Hallucinating, sweating, itching    Current Medications: Reviewed.  Current Outpatient Prescriptions on File Prior to Visit  Medication Sig Dispense Refill  . atorvastatin (LIPITOR) 40 MG tablet TAKE 1 TABLET BY MOUTH DAILY 30 tablet 0  . atorvastatin (LIPITOR) 40 MG tablet TAKE 1 TABLET BY MOUTH DAILY 30 tablet 0  . beclomethasone (QVAR) 80 MCG/ACT inhaler Inhale 2 puffs into the lungs 2 (two) times daily. 1 Inhaler 12  . gabapentin (NEURONTIN) 800 MG tablet TAKE 1 AND 1/2 TABLETS BY MOUTH AT NIGHT 45 tablet 0  . levothyroxine (SYNTHROID, LEVOTHROID) 150 MCG tablet TAKE 1 TABLET  BY MOUTH DAILY 30 tablet 0  . montelukast (SINGULAIR) 10 MG tablet TAKE 1 TABLET BY MOUTH AT BEDTIME 90 tablet 1  . MYRBETRIQ 50 MG TB24 tablet TAKE 1 TABLET BY MOUTH EVERY DAY 30 tablet 0  . MYRBETRIQ 50 MG TB24 tablet TAKE 1 TABLET BY MOUTH EVERY DAY 30 tablet 0  . MYRBETRIQ 50 MG TB24 tablet TAKE 1 TABLET BY MOUTH EVERY DAY 30 tablet 0  . MYRBETRIQ 50 MG TB24 tablet TAKE 1 TABLET BY MOUTH EVERY DAY 30 tablet 0  . MYRBETRIQ 50 MG TB24 tablet TAKE 1 TABLET BY MOUTH EVERY DAY 30 tablet 0  . MYRBETRIQ 50 MG TB24 tablet TAKE 1 TABLET BY MOUTH EVERY DAY 30  tablet 0  . MYRBETRIQ 50 MG TB24 tablet TAKE 1 TABLET BY MOUTH EVERY DAY 30 tablet 0  . rOPINIRole (REQUIP) 3 MG tablet Take 1 tablet (3 mg total) by mouth at bedtime. 30 tablet 3  . scopolamine (TRANSDERM-SCOP, 1.5 MG,) 1 MG/3DAYS Place 1 patch (1.5 mg total) onto the skin every 3 (three) days. 4 patch 3  . Tapentadol HCl (NUCYNTA) 100 MG TABS Take 100 mg by mouth every 6 (six) hours as needed. TAKE 100 MG EVERY 6 HOURS AS NEEDED    . tiZANidine (ZANAFLEX) 4 MG tablet TK 1 T PO QID PRN  1  . [DISCONTINUED] mometasone (NASONEX) 50 MCG/ACT nasal spray Place 2 sprays into the nose daily. 17 g 12  . [DISCONTINUED] omeprazole (PRILOSEC) 20 MG capsule     . [DISCONTINUED] oxybutynin (DITROPAN-XL) 5 MG 24 hr tablet Take 1 tablet (5 mg total) by mouth daily. 30 tablet 3  . [DISCONTINUED] sucralfate (CARAFATE) 1 G tablet Take 2 tablets (2 g total) by mouth 2 (two) times daily before a meal. preferbly an hour before eating 120 tablet 6   No current facility-administered medications on file prior to visit.        Family History: Reviewed.  Family History   Problem  Relation  Age of Onset   .  Hypertension  Father    .  Heart disease  Sister  45      heart attack    .  Depression  Brother    .  Cancer  Mother  [id number]      colon    Psychiatric Specialty Exam: General Appearance: Fairly Groomed  Patent attorneyye Contact::  Good  Speech:  Clear and Coherent and Normal Rate  Volume:  Normal  Mood: pain distresses her  Affect:  Appropriate  Thought Process:  Loose  Orientation:  Full (Time, Place, and Person)  Thought Content:  WDL  Suicidal Thoughts:  No  Homicidal Thoughts:  No  Memory:  Immediate;   Good Recent;   Good Remote;   Good  Judgement:  Good  Insight:  Fair  Psychomotor Activity:  Normal  Concentration:  Fair  Recall:  Poor  Akathisia:  No  Handed:  Right  Fund of knowledge-average to above average  Language-Intact  AIMS (if indicated):     Assets:  Manufacturing systems engineerCommunication Skills Desire for  Improvement Financial Resources/Insurance Housing Intimacy Leisure Time Physical Health Resilience Social Support Talents/Skills Transportation Vocational/Educational  Language intact   Fund of knowledge is good      Assessment:  Major Depression, Recurrent severe-stable Alcohol Dependence in full sustained remission-stable AXIS I  Major Depression, Recurrent severe, Alcohol Dependence in full sustained remission . Fibromyalgia.     Treatment Plan/Recommendations:   1. Affirm with the patient that  the medications are taken as ordered. Patient expressed understanding of how their medications were to be used.  2. Continue the following psychiatric medications as written prior to this appointment with the following changes:  Avoid alcohol and continue abstinenece. Denies craving. Sober now for 9 years Her main focus is poor sleep , restless legs and immobility at times due to fibromyalgia.   Depression : continue paxil,  Mood symptoms: continue lamictal Sleep: avoid more then 200mg  trazadone since she has restless legs Reviewed sleep hygiene also take some fruit at evening   3. Therapy: brief supportive therapy provided. Discussed psychosocial stressors. Continue current services. More than 50% of the visit was spent on individual therapy/counseling and coorcdination of care.  4. Risks and benefits, side effects and alternatives discussed with patient, she was given an opportunity to ask questions about his/her medication, illness, and treatment. All current psychiatric medications have been reviewed and discussed with the patient and adjusted as clinically appropriate. The patient has been provided an accurate and updated list of the medications being now prescribed.  5. Patient told to call clinic if any problems occur. Patient advised to go to ER if she should develop SI/HI, side effects, or if symptoms worsen. Has crisis numbers to call if needed.  6. Labs as per PCP 7. The  patient was encouraged to keep all PCP and specialty clinic appointments.  8. Patient was instructed to return to clinic in 2-3 month.  9.The patient expressed understanding of the above and agrees with the plan.  10.follow up 2- 3 months.   time spent: 25 minutes   Thresa Ross, MD

## 2015-09-16 ENCOUNTER — Telehealth (HOSPITAL_COMMUNITY): Payer: Self-pay | Admitting: *Deleted

## 2015-09-16 NOTE — Telephone Encounter (Signed)
[facility] and [facility]razodone.

## 2015-09-22 ENCOUNTER — Telehealth (HOSPITAL_COMMUNITY): Payer: Self-pay | Admitting: *Deleted

## 2015-09-22 NOTE — Telephone Encounter (Signed)
Called for prior authorization of Trazodone and Lamotrigine, was on hold for 25 minutes then at 1702 was told they closed at 1700. Phone was automatically transferred to after hours crisis line at 1700.

## 2015-09-23 ENCOUNTER — Telehealth (HOSPITAL_COMMUNITY): Payer: Self-pay | Admitting: *Deleted

## 2015-09-23 NOTE — Telephone Encounter (Signed)
Called for prior authorization of Trazodone, was on hold 40 mins, then transferred to wrong number twice.

## 2015-09-24 ENCOUNTER — Telehealth (HOSPITAL_COMMUNITY): Payer: Self-pay | Admitting: *Deleted

## 2015-09-24 NOTE — Telephone Encounter (Signed)
Called for prior authorization to [facility], spoke with Gavin PoundDeborah who states Trazodone prior authorization not needed, pharmacy trying to refill too soon. Lamotrigine prior authorization also not needed, paid claim was received.

## 2015-09-28 ENCOUNTER — Other Ambulatory Visit: Payer: Self-pay | Admitting: Sports Medicine

## 2015-10-02 ENCOUNTER — Other Ambulatory Visit (HOSPITAL_COMMUNITY): Payer: Self-pay | Admitting: Psychiatry

## 2015-10-05 NOTE — Telephone Encounter (Signed)
[facility] from [facility] refills for Lamictal and Paxil. Per Dr. Gilmore LarocheAkhtar, refill request is denied. Pt request refills for Lamictal and Paxil too soon. Refills were sent to pharmacy on 9/12 with 1 refill. lvm for pt to contact office to schedule appt.

## 2015-10-14 ENCOUNTER — Encounter (HOSPITAL_COMMUNITY): Payer: Self-pay | Admitting: Psychiatry

## 2015-10-14 ENCOUNTER — Ambulatory Visit (INDEPENDENT_AMBULATORY_CARE_PROVIDER_SITE_OTHER): Payer: BLUE CROSS/BLUE SHIELD | Admitting: Psychiatry

## 2015-10-14 VITALS — BP 132/84 | HR 103 | Ht 65.0 in | Wt 207.0 lb

## 2015-10-14 DIAGNOSIS — F411 Generalized anxiety disorder: Secondary | ICD-10-CM

## 2015-10-14 DIAGNOSIS — M797 Fibromyalgia: Secondary | ICD-10-CM

## 2015-10-14 DIAGNOSIS — F332 Major depressive disorder, recurrent severe without psychotic features: Secondary | ICD-10-CM

## 2015-10-14 DIAGNOSIS — Z818 Family history of other mental and behavioral disorders: Secondary | ICD-10-CM

## 2015-10-14 DIAGNOSIS — F5102 Adjustment insomnia: Secondary | ICD-10-CM | POA: Diagnosis not present

## 2015-10-14 DIAGNOSIS — Z801 Family history of malignant neoplasm of trachea, bronchus and lung: Secondary | ICD-10-CM

## 2015-10-14 MED ORDER — LAMOTRIGINE 150 MG PO TABS
150.0000 mg | ORAL_TABLET | Freq: Every day | ORAL | 1 refills | Status: DC
Start: 2015-10-14 — End: 2015-11-19
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
## 2015-10-19 ENCOUNTER — Other Ambulatory Visit: Payer: Self-pay | Admitting: Sports Medicine

## 2015-10-22 ENCOUNTER — Telehealth (HOSPITAL_COMMUNITY): Payer: Self-pay | Admitting: *Deleted

## 2015-10-22 NOTE — Telephone Encounter (Signed)
Noted  

## 2015-10-22 NOTE — Telephone Encounter (Signed)
Prior authorization for Lamotrigine received. [facility] spoke with Chase PicketSharee who states that a paid claim was received 10/12 with next fill date of 11/2. No authorization is required.

## 2015-10-27 ENCOUNTER — Other Ambulatory Visit (HOSPITAL_COMMUNITY): Payer: Self-pay | Admitting: Psychiatry

## 2015-10-30 ENCOUNTER — Ambulatory Visit: Payer: Self-pay | Admitting: Physician Assistant

## 2015-11-02 NOTE — Telephone Encounter (Signed)
[facility] from [facility] requesting a refill for Trazodone. Per Dr. Gilmore LarocheAkhtar, refill request is denied. Pt has request refill too early.  Prescription was sent to pharmacy on 9/12 for Trazodone 100mg , #60 w/ 1 refill. Pt f/u appt is schedule on 11/07. Called and informed pt of refill status. Pt verbalizes understanding.

## 2015-11-03 ENCOUNTER — Other Ambulatory Visit: Payer: Self-pay | Admitting: Sports Medicine

## 2015-11-10 ENCOUNTER — Ambulatory Visit (HOSPITAL_COMMUNITY): Payer: Self-pay | Admitting: Psychiatry

## 2015-11-16 ENCOUNTER — Other Ambulatory Visit (HOSPITAL_COMMUNITY): Payer: Self-pay | Admitting: Psychiatry

## 2015-11-17 NOTE — Telephone Encounter (Signed)
[facility] from [facility] requesting a refill for Trazodone 100mg . Per Dr. Gilmore LarocheAkhtar, refill is authorize for Trazodone 100mg , #60. Prescription sent to pharmacy. Pt is schedule for f/u apt on 11/16. Called and informed pt of refill status. Pt verbalizes understanding.

## 2015-11-19 ENCOUNTER — Ambulatory Visit (INDEPENDENT_AMBULATORY_CARE_PROVIDER_SITE_OTHER): Payer: BLUE CROSS/BLUE SHIELD | Admitting: Psychiatry

## 2015-11-19 ENCOUNTER — Encounter (HOSPITAL_COMMUNITY): Payer: Self-pay | Admitting: Psychiatry

## 2015-11-19 VITALS — BP 122/70 | HR 98 | Resp 16 | Ht 65.0 in | Wt 207.0 lb

## 2015-11-19 DIAGNOSIS — F332 Major depressive disorder, recurrent severe without psychotic features: Secondary | ICD-10-CM | POA: Diagnosis not present

## 2015-11-19 DIAGNOSIS — Z888 Allergy status to other drugs, medicaments and biological substances status: Secondary | ICD-10-CM

## 2015-11-19 DIAGNOSIS — Z79899 Other long term (current) drug therapy: Secondary | ICD-10-CM

## 2015-11-19 DIAGNOSIS — F5102 Adjustment insomnia: Secondary | ICD-10-CM | POA: Diagnosis not present

## 2015-11-19 DIAGNOSIS — M797 Fibromyalgia: Secondary | ICD-10-CM | POA: Diagnosis not present

## 2015-11-19 DIAGNOSIS — F411 Generalized anxiety disorder: Secondary | ICD-10-CM

## 2015-11-19 MED ORDER — TRAZODONE HCL 100 MG PO TABS: ORAL_TABLET | ORAL | 1 refills | Status: DC

## 2015-11-19 MED ORDER — LAMOTRIGINE 200 MG PO TABS: 200.0000 mg | ORAL_TABLET | Freq: Every day | ORAL | 1 refills | Status: DC

## 2015-11-19 MED ORDER — PAROXETINE HCL 40 MG PO TABS: ORAL_TABLET | ORAL | 1 refills | Status: DC
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
## 2015-12-08 ENCOUNTER — Ambulatory Visit (INDEPENDENT_AMBULATORY_CARE_PROVIDER_SITE_OTHER): Payer: BLUE CROSS/BLUE SHIELD | Admitting: Licensed Clinical Social Worker

## 2015-12-08 ENCOUNTER — Other Ambulatory Visit (HOSPITAL_COMMUNITY): Payer: Self-pay | Admitting: Psychiatry

## 2015-12-08 ENCOUNTER — Other Ambulatory Visit: Payer: Self-pay | Admitting: Sports Medicine

## 2015-12-08 DIAGNOSIS — F411 Generalized anxiety disorder: Secondary | ICD-10-CM

## 2015-12-08 DIAGNOSIS — F332 Major depressive disorder, recurrent severe without psychotic features: Secondary | ICD-10-CM
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
## 2015-12-11 ENCOUNTER — Other Ambulatory Visit: Payer: Self-pay | Admitting: Sports Medicine

## 2015-12-11 ENCOUNTER — Other Ambulatory Visit (HOSPITAL_COMMUNITY): Payer: Self-pay | Admitting: Psychiatry

## 2015-12-16 ENCOUNTER — Telehealth: Payer: Self-pay

## 2015-12-16 DIAGNOSIS — G894 Chronic pain syndrome: Secondary | ICD-10-CM

## 2015-12-16 NOTE — Telephone Encounter (Signed)
Pt left VM asking for a referral to [facility]. Please assist.

## 2015-12-16 NOTE — Telephone Encounter (Signed)
[facility] a [facility] Lam[facility]al and Paxil. Per Dr. Gilmore LarocheAkhtar, refill requests are denied. Rx's for Lamictal 100mg , #30 w/ 1 refill and Paxil 40mg , #30 w/ 1 refill was sent to pharmacy on 11/16. Pt f/u apt is schedule on 1/8. Nothing further is needed at this time. lvm for pt to contact pt.

## 2015-12-16 NOTE — Telephone Encounter (Signed)
[facility] Lam[facility]al. Per Dr. Gilmore LarocheAkhtar, refill requests are denied. Rx's for Lamictal 100mg , #30 w/ 1 refill  was sent to pharmacy on 11/16. Pt f/u apt is schedule on 1/8. Nothing further is needed at this time. lvm for pt to contact pt.

## 2015-12-18 ENCOUNTER — Ambulatory Visit: Payer: Self-pay | Admitting: Sports Medicine

## 2015-12-22 ENCOUNTER — Ambulatory Visit (INDEPENDENT_AMBULATORY_CARE_PROVIDER_SITE_OTHER): Payer: BLUE CROSS/BLUE SHIELD | Admitting: Sports Medicine

## 2015-12-22 ENCOUNTER — Encounter: Payer: Self-pay | Admitting: Sports Medicine

## 2015-12-22 DIAGNOSIS — E039 Hypothyroidism, unspecified: Secondary | ICD-10-CM | POA: Diagnosis not present

## 2015-12-22 DIAGNOSIS — J449 Chronic obstructive pulmonary disease, unspecified: Secondary | ICD-10-CM

## 2015-12-22 DIAGNOSIS — M797 Fibromyalgia: Secondary | ICD-10-CM

## 2015-12-22 DIAGNOSIS — E785 Hyperlipidemia, unspecified: Secondary | ICD-10-CM | POA: Diagnosis not present

## 2015-12-22 DIAGNOSIS — J4489 Other specified chronic obstructive pulmonary disease: Secondary | ICD-10-CM

## 2015-12-22 DIAGNOSIS — Z299 Encounter for prophylactic measures, unspecified: Secondary | ICD-10-CM

## 2015-12-22 MED ORDER — AMITRIPTYLINE HCL 25 MG PO TABS: 25.0000 mg | ORAL_TABLET | Freq: Every day | ORAL | 3 refills | Status: DC

## 2015-12-22 MED ORDER — FLUTICASONE FUROATE-VILANTEROL 100-25 MCG/INH IN AEPB: 1.0000 | INHALATION_SPRAY | Freq: Every day | RESPIRATORY_TRACT | 11 refills | Status: DC

## 2015-12-22 MED ORDER — GABAPENTIN 800 MG PO TABS: 1600.0000 mg | ORAL_TABLET | Freq: Two times a day (BID) | ORAL | 3 refills | Status: DC

## 2015-12-22 NOTE — Assessment & Plan Note (Signed)
Note from COPD on recent spirometry, patient continues to complain of  low-grade chronic dyspnea. [facility], if this doesn't improve her symptoms we will do another pre-and postbronchodilator spirometry.  I do suspect a lot of her shortness of breath is supratentorial.

## 2015-12-22 NOTE — Assessment & Plan Note (Signed)
Due for mammogram, patient desires to have influenza vaccine after Christmas. Checking hep C and HIV.

## 2015-12-22 NOTE — Assessment & Plan Note (Signed)
Increasing gabapentin to 1600 mg twice a day. Adding Robaxin, amitriptyline 25 at bedtime. Allergic to narcotics and no response in the past to tramadol. She was discharged from her pain clinic so we will discontinue Nucynta.

## 2015-12-22 NOTE — Assessment & Plan Note (Addendum)
Recheck TSH.  TSH is a bit low so I am going to drop levothyroxine to 137 g.  Recheck in one month.

## 2015-12-22 NOTE — Progress Notes (Signed)
  Subjective:    CC: Follow-up  HPI: Fibromyalgia: Has been discharged from her pain clinic, currently only taking gabapentin 800 twice a day, Zanaflex which makes her too drowsy, she was taking Nucynta but understands we will not be continuing this.   Hypothyroidism: Due for a TSH recheck.  Recurrent bronchitis: With a negative pre-and postbronchodilator spirometry, currently taking Qvar, feels as though this is not strong enough, has a subjective getting air in and out.   Hyperlipidemia: Due for routine blood work.  Past medical history:  Negative.  See flowsheet/record as well for more information.  Surgical history: Negative.  See flowsheet/record as well for more information.  Family history: Negative.  See flowsheet/record as well for more information.  Social history: Negative.  See flowsheet/record as well for more information.  Allergies, and medications have been entered into the medical record, reviewed, and no changes needed.   Review of Systems: No fevers, chills, night sweats, weight loss, chest pain, or shortness of breath.   Objective:    General: Well Developed, well nourished, and in no acute distress.  Neuro: Alert and oriented x3, extra-ocular muscles intact, sensation grossly intact.  HEENT: Normocephalic, atraumatic, pupils equal round reactive to light, neck supple, no masses, no lymphadenopathy, thyroid nonpalpable.  Skin: Warm and dry, no rashes. Cardiac: Regular rate and rhythm, no murmurs rubs or gallops, no lower extremity edema.  Respiratory: Clear to auscultation bilaterally. Not using accessory muscles, speaking in full sentences.  Impression and Recommendations:    Fibromyalgia Increasing gabapentin to 1600 mg twice a day. Adding Robaxin, amitriptyline 25 at bedtime. Allergic to narcotics and no response in the past to tramadol. She was discharged from her pain clinic so we will discontinue Nucynta.  Hypothyroidism Recheck TSH  Obstructive  chronic bronchitis without exacerbation Note from COPD on recent spirometry, patient continues to complain of  low-grade chronic dyspnea. [facility], if this doesn't improve her symptoms we will do another pre-and postbronchodilator spirometry.  I do suspect a lot of her shortness of breath is supratentorial.   Hyperlipidemia with target low density lipoprotein (LDL) cholesterol less than 100 mg/dL Rechecking lipids and other screening labs.  Preventive measure Due for mammogram, patient desires to have influenza vaccine after Christmas. Checking hep C and HIV.  I spent 40 minutes with this patient, greater than 50% was face-to-face time counseling regarding the above diagnoses

## 2015-12-22 NOTE — Assessment & Plan Note (Signed)
Rechecking lipids and other screening labs.

## 2015-12-23 LAB — CBC
HCT: 42 % (ref 35.0–45.0)
Hemoglobin: 14.2 g/dL (ref 11.7–15.5)
MCH: 31.4 pg (ref 27.0–33.0)
MCHC: 33.8 g/dL (ref 32.0–36.0)
MCV: 92.9 fL (ref 80.0–100.0)
MPV: 9.5 fL (ref 7.5–12.5)
Platelets: 143 K/uL (ref 140–400)
RBC: 4.52 MIL/uL (ref 3.80–5.10)
RDW: 13.9 % (ref 11.0–15.0)
WBC: 3.7 K/uL — ABNORMAL LOW (ref 3.8–10.8)

## 2015-12-23 LAB — VITAMIN D 25 HYDROXY (VIT D DEFICIENCY, FRACTURES): Vit D, 25-Hydroxy: 34 ng/mL (ref 30–100)

## 2015-12-23 LAB — HEMOGLOBIN A1C
Hgb A1c MFr Bld: 5.3 % (ref <5.7)
Mean Plasma Glucose: 105 mg/dL

## 2015-12-23 LAB — LIPID PANEL
Cholesterol: 168 mg/dL (ref <200)
HDL: 55 mg/dL (ref >50)
LDL Cholesterol: 78 mg/dL (ref <100)
Total CHOL/HDL Ratio: 3.1 Ratio (ref <5.0)
Triglycerides: 174 mg/dL — ABNORMAL HIGH (ref <150)
VLDL: 35 mg/dL — ABNORMAL HIGH (ref <30)

## 2015-12-23 LAB — TSH: TSH: 0.1 m[IU]/L — ABNORMAL LOW

## 2015-12-23 LAB — COMPREHENSIVE METABOLIC PANEL
Alkaline Phosphatase: 49 U/L (ref 33–130)
CO2: 27 mmol/L (ref 20–31)
Calcium: 9.3 mg/dL (ref 8.6–10.4)
Chloride: 104 mmol/L (ref 98–110)
Glucose, Bld: 126 mg/dL — ABNORMAL HIGH (ref 65–99)
Potassium: 4 mmol/L (ref 3.5–5.3)

## 2015-12-23 LAB — COMPREHENSIVE METABOLIC PANEL WITH GFR
ALT: 22 U/L (ref 6–29)
AST: 19 U/L (ref 10–35)
Albumin: 4.1 g/dL (ref 3.6–5.1)
BUN: 11 mg/dL (ref 7–25)
Creat: 0.97 mg/dL (ref 0.50–1.05)
Sodium: 141 mmol/L (ref 135–146)
Total Bilirubin: 0.5 mg/dL (ref 0.2–1.2)
Total Protein: 6.5 g/dL (ref 6.1–8.1)

## 2015-12-23 LAB — HEPATITIS C ANTIBODY: HCV Ab: NEGATIVE

## 2015-12-23 LAB — HIV ANTIBODY (ROUTINE TESTING W REFLEX): HIV 1&2 Ab, 4th Generation: NONREACTIVE

## 2015-12-23 MED ORDER — LEVOTHYROXINE SODIUM 137 MCG PO TABS: 137.0000 ug | ORAL_TABLET | Freq: Every day | ORAL | 3 refills | Status: DC

## 2015-12-23 NOTE — Addendum Note (Signed)
Addended by: Monica BectonHEKKEKANDAM, Marcey Persad J on: 12/23/2015 10:48 AM   Modules accepted: Orders

## 2015-12-31 ENCOUNTER — Ambulatory Visit (INDEPENDENT_AMBULATORY_CARE_PROVIDER_SITE_OTHER): Payer: BLUE CROSS/BLUE SHIELD | Admitting: Sports Medicine

## 2015-12-31 ENCOUNTER — Other Ambulatory Visit (HOSPITAL_COMMUNITY): Payer: Self-pay | Admitting: Psychiatry

## 2015-12-31 ENCOUNTER — Encounter: Payer: Self-pay | Admitting: Sports Medicine

## 2015-12-31 DIAGNOSIS — M797 Fibromyalgia: Secondary | ICD-10-CM

## 2015-12-31 MED ORDER — METHOCARBAMOL 500 MG PO TABS: 500.0000 mg | ORAL_TABLET | Freq: Three times a day (TID) | ORAL | 0 refills | Status: DC

## 2015-12-31 NOTE — Assessment & Plan Note (Addendum)
With chronic pain syndrome. Continue gabapentin 1600 mg twice a day, continue Robaxin. Patient did not even get her amitriptyline, she will start this at bedtime, if insufficient relief we will certainly bump her up to 50 mg at bedtime. She is intolerant of narcotics and did not have a response to tramadol. She was discharged from previous pain clinic, so Nucynta has been discontinued however this did control her pain. I'm going to try and get her in with Dr. Oneal GroutGarvin to see if she can help out with non-chronic medical pain management.

## 2015-12-31 NOTE — Progress Notes (Signed)
  Subjective:    CC: Follow-up  HPI: Jennifer [name] is a 55 year old female, she has fibromyalgia and chronic pain syndrome, currently taking Paxil, gabapentin at 1600 mg twice a day, Robaxin, we also added amitriptyline but she forgot to take it.  She was discharged from a previous pain clinic, I am not sure why but she doesn't take any narcotics.  Past medical history:  Negative.  See flowsheet/record as well for more information.  Surgical history: Negative.  See flowsheet/record as well for more information.  Family history: Negative.  See flowsheet/record as well for more information.  Social history: Negative.  See flowsheet/record as well for more information.  Allergies, and medications have been entered into the medical record, reviewed, and no changes needed.   Review of Systems: No fevers, chills, night sweats, weight loss, chest pain, or shortness of breath.   Objective:    General: Well Developed, well nourished, and in no acute distress.  Neuro: Alert and oriented x3, extra-ocular muscles intact, sensation grossly intact.  HEENT: Normocephalic, atraumatic, pupils equal round reactive to light, neck supple, no masses, no lymphadenopathy, thyroid nonpalpable.  Skin: Warm and dry, no rashes. Cardiac: Regular rate and rhythm, no murmurs rubs or gallops, no lower extremity edema.  Respiratory: Clear to auscultation bilaterally. Not using accessory muscles, speaking in full sentences.  Impression and Recommendations:    Fibromyalgia With chronic pain syndrome. Continue gabapentin 1600 mg twice a day, continue Robaxin. Patient did not even get her amitriptyline, she will start this at bedtime, if insufficient relief we will certainly bump her up to 50 mg at bedtime. She is intolerant of narcotics and did not have a response to tramadol. She was discharged from previous pain clinic, so Nucynta has been discontinued however this did control her pain. I'm going to try and get her in with  Dr. Oneal GroutGarvin to see if she can help out with non-chronic medical pain management.  I spent 25 minutes with this patient, greater than 50% was face-to-face time counseling regarding the above diagnoses

## 2016-01-08 ENCOUNTER — Ambulatory Visit: Payer: Self-pay | Admitting: Sports Medicine

## 2016-01-10 ENCOUNTER — Other Ambulatory Visit: Payer: Self-pay | Admitting: Sports Medicine

## 2016-01-11 ENCOUNTER — Ambulatory Visit (HOSPITAL_COMMUNITY): Payer: Self-pay | Admitting: Licensed Clinical Social Worker

## 2016-01-11 ENCOUNTER — Other Ambulatory Visit: Payer: Self-pay | Admitting: Sports Medicine

## 2016-01-12 ENCOUNTER — Ambulatory Visit (INDEPENDENT_AMBULATORY_CARE_PROVIDER_SITE_OTHER): Payer: BLUE CROSS/BLUE SHIELD | Admitting: Psychiatry

## 2016-01-12 ENCOUNTER — Encounter (HOSPITAL_COMMUNITY): Payer: Self-pay | Admitting: Psychiatry

## 2016-01-12 VITALS — BP 146/84 | HR 86 | Resp 16 | Ht 65.0 in | Wt 209.0 lb

## 2016-01-12 DIAGNOSIS — Z818 Family history of other mental and behavioral disorders: Secondary | ICD-10-CM

## 2016-01-12 DIAGNOSIS — F5102 Adjustment insomnia: Secondary | ICD-10-CM

## 2016-01-12 DIAGNOSIS — Z888 Allergy status to other drugs, medicaments and biological substances status: Secondary | ICD-10-CM

## 2016-01-12 DIAGNOSIS — F332 Major depressive disorder, recurrent severe without psychotic features: Secondary | ICD-10-CM

## 2016-01-12 DIAGNOSIS — M797 Fibromyalgia: Secondary | ICD-10-CM | POA: Diagnosis not present

## 2016-01-12 DIAGNOSIS — Z79899 Other long term (current) drug therapy: Secondary | ICD-10-CM

## 2016-01-12 DIAGNOSIS — F411 Generalized anxiety disorder: Secondary | ICD-10-CM | POA: Diagnosis not present

## 2016-01-12 DIAGNOSIS — Z808 Family history of malignant neoplasm of other organs or systems: Secondary | ICD-10-CM

## 2016-01-12 DIAGNOSIS — Z8249 Family history of ischemic heart disease and other diseases of the circulatory system: Secondary | ICD-10-CM

## 2016-01-12 MED ORDER — PAROXETINE HCL 40 MG PO TABS: ORAL_TABLET | ORAL | 1 refills | Status: DC

## 2016-01-12 MED ORDER — LAMOTRIGINE 200 MG PO TABS: 200.0000 mg | ORAL_TABLET | Freq: Every day | ORAL | 1 refills | Status: DC

## 2016-01-12 MED ORDER — TRAZODONE HCL 100 MG PO TABS: ORAL_TABLET | ORAL | 1 refills | Status: DC

## 2016-01-12 NOTE — Progress Notes (Signed)
Patient ID: Jennifer [name], female   DOB: [birth year], 58 y.o.   MRN: [id number]   [facility] Follow-up Outpatient Visit  Jennifer [name] 1960-11-13  Date:  01/12/2016  HPI Comments: SUBJECTIVE: Jennifer [name] is a 56 year old female with a diagnosis of major Depressive Disorder. 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   The patient returns for psychiatric services and  medication management.   Patient had an incident with her son last year that led to concern and exacerbated her depression she is not able to see the kids and that continues to remain a stress. Last visit we increased the lamotrigine that has helped some but she is still not able to deal or see the grandkids She is not using any alcohol for last 12 years remains sober. Fibromyalgia does affect her sleep and mood symptoms she is on medications Cannot sleep well without trazadone  Bad or cold weather exacerbates her pain.  . Severity: Depression: .6.5/10 (0=Very depressed; 5=Neutral; 10=Very Happy)  Anxiety- 4/10 (0=no anxiety; 5= moderate/tolerable anxiety; 10= panic attacks) Modifying factor: her supportive husband.  . Duration" Since age 65.  . Timing: Mood is worse some at times of holidays or in the morning  . Context: Patient reports her main stressor has to do pain and flare-ups of fibromyalgia or coPd.Marland Kitchen Conflict with son    Review of Systems  Constitutional: Negative for fever.  Cardiovascular: Negative for chest pain.  Gastrointestinal: Negative for nausea.  Musculoskeletal: Positive for myalgias.  Skin: Negative for rash.  Neurological: Negative for tremors and headaches.  Psychiatric/Behavioral: Negative for substance abuse and suicidal ideas.   Weight 206 lbs. Height 5\' 5"  pulse 90.   Physical Exam  Vitals reviewed.  Constitutional: She appears well-developed and well-nourished. No distress.  Skin: She is not diaphoretic.  Musculoskeletal: Gait & Station: normal Patient  leans: N/A   Traumatic Brain Injury: No   Past Medical History: Reviewed.  Past Medical History   Diagnosis  Date   .  Discoid lupus    .  Thyroid disease    .  Perimenopausal    .  Fibromyalgia    .  CFS (chronic fatigue syndrome)    .  Sleep apnea      Allergies: Reviewed.  Allergies   Allergen  Reactions   .  Acetaminophen      Pt states she is unable to take this bc of Hashimoto Thyroid   .  Hydrocodone-Acetaminophen      REACTION: agitation   .  Oxycodone      Hallucinating, sweating, itching    Current Medications: Reviewed.  Current Outpatient Prescriptions on File Prior to Visit  Medication Sig Dispense Refill  . amitriptyline (ELAVIL) 25 MG tablet Take 1 tablet (25 mg total) by mouth at bedtime. 30 tablet 3  . atorvastatin (LIPITOR) 40 MG tablet TAKE 1 TABLET BY MOUTH DAILY 30 tablet 0  . atorvastatin (LIPITOR) 40 MG tablet TAKE 1 TABLET BY MOUTH DAILY 30 tablet 0  . fluticasone furoate-vilanterol (BREO ELLIPTA) 100-25 MCG/INH AEPB Inhale 1 puff into the lungs daily. 1 each 11  . gabapentin (NEURONTIN) 800 MG tablet Take 2 tablets (1,600 mg total) by mouth 2 (two) times daily. 120 tablet 3  . levothyroxine (SYNTHROID, LEVOTHROID) 137 MCG tablet Take 1 tablet (137 mcg total) by mouth daily. 30 tablet 3  . levothyroxine (SYNTHROID, LEVOTHROID) 150 MCG tablet TAKE 1 TABLET BY MOUTH DAILY 30 tablet 0  . LORazepam (ATIVAN) 0.5 MG  tablet Take 0.5 mg by mouth every 8 (eight) hours as needed.    . methocarbamol (ROBAXIN) 500 MG tablet Take 1 tablet (500 mg total) by mouth 3 (three) times daily. 90 tablet 0  . montelukast (SINGULAIR) 10 MG tablet TAKE 1 TABLET BY MOUTH AT BEDTIME 90 tablet 1  . MYRBETRIQ 50 MG TB24 tablet TAKE 1 TABLET BY MOUTH EVERY DAY 30 tablet 0  . MYRBETRIQ 50 MG TB24 tablet TAKE 1 TABLET BY MOUTH EVERY DAY 30 tablet 0  . rOPINIRole (REQUIP) 3 MG tablet Take 1 tablet (3 mg total) by mouth at bedtime. 30 tablet 3  . scopolamine (TRANSDERM-SCOP, 1.5 MG,)  1 MG/3DAYS Place 1 patch (1.5 mg total) onto the skin every 3 (three) days. 4 patch 3  . [DISCONTINUED] mometasone (NASONEX) 50 MCG/ACT nasal spray Place 2 sprays into the nose daily. 17 g 12  . [DISCONTINUED] omeprazole (PRILOSEC) 20 MG capsule     . [DISCONTINUED] oxybutynin (DITROPAN-XL) 5 MG 24 hr tablet Take 1 tablet (5 mg total) by mouth daily. 30 tablet 3  . [DISCONTINUED] sucralfate (CARAFATE) 1 G tablet Take 2 tablets (2 g total) by mouth 2 (two) times daily before a meal. preferbly an hour before eating 120 tablet 6   No current facility-administered medications on file prior to visit.        Family History: Reviewed.  Family History   Problem  Relation  Age of Onset   .  Hypertension  Father    .  Heart disease  Sister  45      heart attack    .  Depression  Brother    .  Cancer  Mother  37      colon    Psychiatric Specialty Exam: General Appearance: Fairly Groomed  Eye Contact::  Good  Speech:  Clear and Coherent and Normal Rate  Volume:  Normal  Mood: improved. Less stressed  Affect: constricted  Thought Process:  Loose  Orientation:  Full (Time, Place, and Person)  Thought Content:  WDL  Suicidal Thoughts:  No  Homicidal Thoughts:  No  Memory:  Immediate;   Good Recent;   Good Remote;   Good  Judgement:  Good  Insight:  Fair  Psychomotor Activity:  Normal  Concentration:  Fair  Recall:  Poor  Akathisia:  No  Handed:  Right  Fund of knowledge-average to above average  Language-Intact  AIMS (if indicated):     Assets:  Manufacturing systems engineer Desire for Improvement Financial Resources/Insurance Housing Intimacy Leisure Time Physical Health Resilience Social Support Talents/Skills Transportation Vocational/Educational  Language intact   Fund of knowledge is good      Assessment:  Major Depression, Recurrent severe-stable Alcohol Dependence in full sustained remission-stable AXIS I  Major Depression, Recurrent severe, Alcohol Dependence in  full sustained remission . Fibromyalgia.     Treatment Plan/Recommendations:   Major depression; continue lamotrigine 200 mg and Paxil. Mood is improved some Anxiety disorder; continue Paxil Insomnia; continue trazodone to be sleep hygiene Fibromyalgia; continue current medications follow with her primary care physician. Bad and cold weather does exacerbate her condition  Sleep: avoid more then 200mg  trazadone since she has restless legs Reviewed sleep hygiene also take some fruit at evening   Continued therapy to work on psychosocial issues. More than 50% time spent in counseling and coordination of care including patient education and review of side effects Patient may have to call at the end of 2nd month for refills I have given  for 2 months today FU 3 months.    Thresa RossAKHTAR, Caedin Mogan, MD

## 2016-01-13 ENCOUNTER — Ambulatory Visit: Payer: Self-pay

## 2016-01-15 ENCOUNTER — Telehealth: Payer: Self-pay

## 2016-01-15 DIAGNOSIS — G894 Chronic pain syndrome: Secondary | ICD-10-CM

## 2016-01-15 NOTE — Telephone Encounter (Signed)
Pt stated that you had suggested on that's down the street from here. Please assist.

## 2016-01-15 NOTE — Telephone Encounter (Signed)
She needs to pick one.  I will no longer search for pain doctors for patients discharged from their pain clinics.  Have her find one that will take her and I'll place the referral.

## 2016-01-15 NOTE — Telephone Encounter (Signed)
Pt left VM stating medications still aren't helping with her pain. Would like to know if you would send her to another pain specialists. Please advise.

## 2016-01-15 NOTE — Telephone Encounter (Signed)
We can try Dr. Burtis Junesarsha Garvin.

## 2016-01-25 ENCOUNTER — Ambulatory Visit (HOSPITAL_COMMUNITY): Payer: Self-pay | Admitting: Licensed Clinical Social Worker

## 2016-02-05 ENCOUNTER — Ambulatory Visit (INDEPENDENT_AMBULATORY_CARE_PROVIDER_SITE_OTHER): Payer: BLUE CROSS/BLUE SHIELD | Admitting: Sports Medicine

## 2016-02-05 ENCOUNTER — Encounter: Payer: Self-pay | Admitting: Sports Medicine

## 2016-02-05 DIAGNOSIS — M5030 Other cervical disc degeneration, unspecified cervical region: Secondary | ICD-10-CM

## 2016-02-05 DIAGNOSIS — J3089 Other allergic rhinitis: Secondary | ICD-10-CM | POA: Diagnosis not present

## 2016-02-05 DIAGNOSIS — Z23 Encounter for immunization: Secondary | ICD-10-CM

## 2016-02-05 MED ORDER — MONTELUKAST SODIUM 10 MG PO TABS: 10.0000 mg | ORAL_TABLET | Freq: Every day | ORAL | 3 refills | Status: DC

## 2016-02-05 MED ORDER — PREDNISONE 50 MG PO TABS: ORAL_TABLET | ORAL | 0 refills | Status: DC

## 2016-02-05 NOTE — Assessment & Plan Note (Signed)
With C4-C6 disc protrusions, she does have some abnormal reversal of the cervical lordosis at this level likely degenerative. The disks to come in contact with spinal cord here, but she doesn't really have severe stenosis. An epidural provided greater than 1 year of relief, continue gabapentin, I am going to order a new MRI, we will give her 5 days of prednisone for rapid relief, and she can return to see me after her MRI to go over results and plan the epidural, old MRI is 58 years old. Prednisone is on her allergy list, she's not really allergic to it but has difficulty with high doses.

## 2016-02-05 NOTE — Progress Notes (Signed)
  Subjective:    CC: Neck pain  HPI: Jennifer [name] returns, she is a 56 year old female with known cervical degenerative disc disease. MRI from 4 years ago showed degenerative changes from C4-C6.  We did a cervical epidural provided over 1 year of relief. Since then she's been at a pain clinic, she's taking gabapentin, Robaxin, amitriptyline. Unfortunately she's having a recurrence of neck pain and desires further evaluation.  Runny nose: Previously well controlled with Singulair, needs a refill.  Past medical history:  Negative.  See flowsheet/record as well for more information.  Surgical history: Negative.  See flowsheet/record as well for more information.  Family history: Negative.  See flowsheet/record as well for more information.  Social history: Negative.  See flowsheet/record as well for more information.  Allergies, and medications have been entered into the medical record, reviewed, and no changes needed.   Review of Systems: No fevers, chills, night sweats, weight loss, chest pain, or shortness of breath.   Objective:    General: Well Developed, well nourished, and in no acute distress.  Neuro: Alert and oriented x3, extra-ocular muscles intact, sensation grossly intact.  HEENT: Normocephalic, atraumatic, pupils equal round reactive to light, neck supple, no masses, no lymphadenopathy, thyroid nonpalpable.  Skin: Warm and dry, no rashes. Cardiac: Regular rate and rhythm, no murmurs rubs or gallops, no lower extremity edema.  Respiratory: Clear to auscultation bilaterally. Not using accessory muscles, speaking in full sentences. Neck: Negative spurling's Full neck range of motion Grip strength and sensation normal in bilateral hands Strength good C4 to T1 distribution No sensory change to C4 to T1 Reflexes normal  MRI from 2014 again reviewed and shows reversal of the normal cervical lordosis at C4-C6, there is degenerative disc disease here with mild to moderate cervical  central canal stenosis.  Impression and Recommendations:    DDD (degenerative disc disease), cervical With C4-C6 disc protrusions, she does have some abnormal reversal of the cervical lordosis at this level likely degenerative. The disks to come in contact with spinal cord here, but she doesn't really have severe stenosis. An epidural provided greater than 1 year of relief, continue gabapentin, I am going to order a new MRI, we will give her 5 days of prednisone for rapid relief, and she can return to see me after her MRI to go over results and plan the epidural, old MRI is 58 years old. Prednisone is on her allergy list, she's not really allergic to it but has difficulty with high doses.  Perennial allergic rhinitis Restarting Singulair.

## 2016-02-05 NOTE — Assessment & Plan Note (Signed)
Restarting Singulair.

## 2016-02-09 ENCOUNTER — Other Ambulatory Visit: Payer: Self-pay | Admitting: Sports Medicine

## 2016-02-10 ENCOUNTER — Other Ambulatory Visit (HOSPITAL_COMMUNITY): Payer: Self-pay | Admitting: Psychiatry

## 2016-02-14 NOTE — Telephone Encounter (Signed)
Medication refill- [facility] for [facility]razodone. Per Dr. Gilmore LarocheAkhtar, refill request is denied. Trazodone Rx was sent to pharmacy on 01/12/16 w/ 1 refills. Pt will need to schedule an apt. Lvm for pt to return call to office.

## 2016-02-22 ENCOUNTER — Ambulatory Visit (INDEPENDENT_AMBULATORY_CARE_PROVIDER_SITE_OTHER): Payer: BLUE CROSS/BLUE SHIELD

## 2016-02-22 DIAGNOSIS — M5030 Other cervical disc degeneration, unspecified cervical region: Secondary | ICD-10-CM

## 2016-02-22 DIAGNOSIS — Z79891 Long term (current) use of opiate analgesic: Secondary | ICD-10-CM | POA: Diagnosis not present

## 2016-02-22 DIAGNOSIS — M542 Cervicalgia: Secondary | ICD-10-CM | POA: Diagnosis not present

## 2016-02-22 DIAGNOSIS — M4802 Spinal stenosis, cervical region: Secondary | ICD-10-CM | POA: Diagnosis not present

## 2016-02-22 DIAGNOSIS — M2578 Osteophyte, vertebrae: Secondary | ICD-10-CM | POA: Diagnosis not present

## 2016-02-22 DIAGNOSIS — G8929 Other chronic pain: Secondary | ICD-10-CM | POA: Diagnosis not present

## 2016-02-28 ENCOUNTER — Other Ambulatory Visit (HOSPITAL_COMMUNITY): Payer: Self-pay | Admitting: Psychiatry

## 2016-02-29 NOTE — Telephone Encounter (Signed)
[facility] for Trazodone. Per Dr. Gilmore LarocheAkhtar, refill request is denied. Rx was sent to pharmacy on 01/12/16 with 1 additional refill. Pt has request refill too early. Pt's next apt is schedule on 03/08/16. Nothing further is needed at this time.

## 2016-03-08 ENCOUNTER — Ambulatory Visit
Admission: RE | Admit: 2016-03-08 | Discharge: 2016-03-08 | Disposition: A | Discharge status: Home / Self Care | Payer: BLUE CROSS/BLUE SHIELD | Source: Ambulatory Visit | Attending: Sports Medicine | Admitting: Sports Medicine

## 2016-03-08 DIAGNOSIS — M50222 Other cervical disc displacement at C5-C6 level: Secondary | ICD-10-CM | POA: Diagnosis not present

## 2016-03-08 MED ORDER — TRIAMCINOLONE ACETONIDE 40 MG/ML IJ SUSP (RADIOLOGY)
60.0000 mg | Freq: Once | INTRAMUSCULAR | Status: AC
  Administered 2016-03-08: 60 mg via EPIDURAL

## 2016-03-08 MED ORDER — IOPAMIDOL (ISOVUE-M 300) INJECTION 61%
1.0000 mL | Freq: Once | INTRAMUSCULAR | Status: AC | PRN
  Administered 2016-03-08: 1 mL via EPIDURAL

## 2016-03-08 NOTE — Discharge Instructions (Signed)

## 2016-03-09 ENCOUNTER — Other Ambulatory Visit: Payer: Self-pay | Admitting: Sports Medicine

## 2016-03-09 ENCOUNTER — Other Ambulatory Visit (HOSPITAL_COMMUNITY): Payer: Self-pay | Admitting: Psychiatry

## 2016-03-09 NOTE — Telephone Encounter (Signed)
[facility] refill for Paxil and Lamictal. Per Dr. Gilmore LarocheAkhtar, refill request is denied. Pt will need to schedule an apt with the clinic. Lvm for pt to schedule an apt with the clinic.

## 2016-03-21 ENCOUNTER — Telehealth: Payer: Self-pay

## 2016-03-21 NOTE — Telephone Encounter (Signed)
Pt left VM stating Myrbetriq is no longer covered. Would like to know what she can get in place of it. Please advise.

## 2016-03-21 NOTE — Telephone Encounter (Signed)
She may just need a new coupon.  Have her come pick one up and then run it through that.

## 2016-03-28 ENCOUNTER — Telehealth (HOSPITAL_COMMUNITY): Payer: Self-pay | Admitting: Psychiatry

## 2016-03-28 NOTE — Telephone Encounter (Signed)
Pt needs refill on paroxetine. Insurance has changed her medication process. She needs 1wk supply to get her though now and a 90 day supply.  Please advise.

## 2016-03-29 ENCOUNTER — Other Ambulatory Visit: Payer: Self-pay

## 2016-03-29 DIAGNOSIS — J3089 Other allergic rhinitis: Secondary | ICD-10-CM

## 2016-03-29 DIAGNOSIS — J449 Chronic obstructive pulmonary disease, unspecified: Secondary | ICD-10-CM

## 2016-03-29 DIAGNOSIS — M797 Fibromyalgia: Secondary | ICD-10-CM

## 2016-03-29 MED ORDER — FLUTICASONE FUROATE-VILANTEROL 100-25 MCG/INH IN AEPB: 1.0000 | INHALATION_SPRAY | Freq: Every day | RESPIRATORY_TRACT | 3 refills | Status: DC

## 2016-03-29 MED ORDER — MONTELUKAST SODIUM 10 MG PO TABS: 10.0000 mg | ORAL_TABLET | Freq: Every day | ORAL | 3 refills | Status: DC

## 2016-03-29 MED ORDER — GABAPENTIN 800 MG PO TABS: 1600.0000 mg | ORAL_TABLET | Freq: Two times a day (BID) | ORAL | 3 refills | Status: DC

## 2016-03-29 MED ORDER — ATORVASTATIN CALCIUM 40 MG PO TABS: 40.0000 mg | ORAL_TABLET | Freq: Every day | ORAL | 1 refills | Status: DC

## 2016-03-29 NOTE — Telephone Encounter (Signed)
Can send prescription 

## 2016-03-30 MED ORDER — PAROXETINE HCL 40 MG PO TABS: ORAL_TABLET | ORAL | 0 refills | Status: DC

## 2016-03-30 NOTE — Telephone Encounter (Signed)
Pt request a 90 day supply of Paxil sent to [facility]. Per Dr. Gilmore LarocheAkhtar, refill request is authorize for Paxil 40mg , #90. Rx sent to pharmacy.   Spoke w/ [facility]. Per pharmacist, pt has a refill on file for Paxil 40mg , #30. Per Dr. Gilmore LarocheAkhtar, verbal order given to authorize refill. Nothing further is needed at this time.   Called and informed pt of refill status. Pt is next for a follow apt on 04/18/16. Pt verbalizes understanding.

## 2016-04-07 ENCOUNTER — Other Ambulatory Visit: Payer: Self-pay | Admitting: Sports Medicine

## 2016-04-08 ENCOUNTER — Other Ambulatory Visit: Payer: Self-pay

## 2016-04-08 ENCOUNTER — Other Ambulatory Visit: Payer: Self-pay | Admitting: Sports Medicine

## 2016-04-08 ENCOUNTER — Other Ambulatory Visit (HOSPITAL_COMMUNITY): Payer: Self-pay | Admitting: *Deleted

## 2016-04-08 MED ORDER — LEVOTHYROXINE SODIUM 137 MCG PO TABS: 137.0000 ug | ORAL_TABLET | Freq: Every day | ORAL | 4 refills | Status: DC

## 2016-04-08 MED ORDER — TRAZODONE HCL 100 MG PO TABS: ORAL_TABLET | ORAL | 0 refills | Status: DC

## 2016-04-08 MED ORDER — LAMOTRIGINE 200 MG PO TABS: 200.0000 mg | ORAL_TABLET | Freq: Every day | ORAL | 0 refills | Status: DC

## 2016-04-08 MED ORDER — LEVOTHYROXINE SODIUM 150 MCG PO TABS: 150.0000 ug | ORAL_TABLET | Freq: Every day | ORAL | 1 refills | Status: DC

## 2016-04-08 NOTE — Telephone Encounter (Signed)
Received fax from [facility] requesting refills for Lamotrigine and Trazodone. Per Dr. Gilmore Laroche, refill request is authorize for Lamotrigine , #90 and Trazodone , #180. Prescriptions were sent to pharmacy. Pt's next apt is schedule on 04/18/16. Lvm informing pt of refill status and apt.

## 2016-04-13 DIAGNOSIS — G8929 Other chronic pain: Secondary | ICD-10-CM | POA: Diagnosis not present

## 2016-04-13 DIAGNOSIS — M4302 Spondylolysis, cervical region: Secondary | ICD-10-CM | POA: Diagnosis not present

## 2016-04-13 DIAGNOSIS — M47816 Spondylosis without myelopathy or radiculopathy, lumbar region: Secondary | ICD-10-CM | POA: Diagnosis not present

## 2016-04-17 ENCOUNTER — Other Ambulatory Visit: Payer: Self-pay | Admitting: Sports Medicine

## 2016-04-17 DIAGNOSIS — E039 Hypothyroidism, unspecified: Secondary | ICD-10-CM

## 2016-04-18 ENCOUNTER — Encounter (HOSPITAL_COMMUNITY): Payer: Self-pay | Admitting: Psychiatry

## 2016-04-18 ENCOUNTER — Ambulatory Visit (INDEPENDENT_AMBULATORY_CARE_PROVIDER_SITE_OTHER): Payer: BLUE CROSS/BLUE SHIELD | Admitting: Psychiatry

## 2016-04-18 ENCOUNTER — Other Ambulatory Visit: Payer: Self-pay | Admitting: Sports Medicine

## 2016-04-18 VITALS — BP 120/80 | HR 81 | Resp 16 | Ht 65.0 in | Wt 213.0 lb

## 2016-04-18 DIAGNOSIS — M797 Fibromyalgia: Secondary | ICD-10-CM

## 2016-04-18 DIAGNOSIS — F411 Generalized anxiety disorder: Secondary | ICD-10-CM

## 2016-04-18 DIAGNOSIS — F332 Major depressive disorder, recurrent severe without psychotic features: Secondary | ICD-10-CM

## 2016-04-18 DIAGNOSIS — F1021 Alcohol dependence, in remission: Secondary | ICD-10-CM

## 2016-04-18 DIAGNOSIS — Z818 Family history of other mental and behavioral disorders: Secondary | ICD-10-CM

## 2016-04-18 DIAGNOSIS — Z79899 Other long term (current) drug therapy: Secondary | ICD-10-CM | POA: Diagnosis not present

## 2016-04-18 NOTE — Progress Notes (Signed)
Patient ID: Jennifer [name], female   DOB: [birth year], 58 y.o.   MRN: [id number]   [facility] Follow-up Outpatient Visit  TKEYA STENCIL 1960-03-01  Date:  04/27/2016  HPI :  Ms. Wilcoxen is a 56 year old female with a diagnosis of major Depressive Disorder. 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   The patient returns for psychiatric services and  medication management.  Patient continues to not be able to communicate with her son she does miss her grandkids but overall her husband supportive she is tolerating medication and Lamictal for mood stabilization. She takes trazodone at night otherwise she can't sleep. She has fibromyalgia that exacerbates her mood symptoms or has flareup that can affect her mood was overall she's trying to keep herself busy she is not hopeless or suicidal but does have some down days with fatigue. No rash or side effects   Bad or cold weather exacerbates her pain.  . Severity: Depression: .7/10. 10 being  No depression Anxiety- 4/10 (0=no anxiety; 5= moderate/tolerable anxiety; 10= panic attacks) Modifying factor: supportive husband    Review of Systems  Constitutional: Negative for fever.  Cardiovascular: Negative for palpitations.  Gastrointestinal: Negative for nausea.  Musculoskeletal: Positive for myalgias.  Skin: Negative for rash.  Neurological: Negative for tremors and headaches.  Psychiatric/Behavioral: Negative for substance abuse and suicidal ideas.   Weight 206 lbs. Height  pulse 90.   Physical Exam  Vitals reviewed.  Constitutional: She appears well-developed and well-nourished. No distress.  Skin: She is not diaphoretic.     Past Medical History: Reviewed.  Past Medical History   Diagnosis  Date   .  Discoid lupus    .  Thyroid disease    .  Perimenopausal    .  Fibromyalgia    .  CFS (chronic fatigue syndrome)    .  Sleep apnea      Allergies: Reviewed.  Allergies   Allergen  Reactions   .   Acetaminophen      Pt states she is unable to take this bc of Hashimoto Thyroid   .  Hydrocodone-Acetaminophen      REACTION: agitation   .  Oxycodone      Hallucinating, sweating, itching    Current Medications: Reviewed.  Current Outpatient Prescriptions on File Prior to Visit  Medication Sig Dispense Refill  . amitriptyline (ELAVIL) 25 MG tablet Take 1 tablet (25 mg total) by mouth at bedtime. 30 tablet 3  . atorvastatin (LIPITOR) 40 MG tablet Take 1 tablet (40 mg total) by mouth daily. 90 tablet 1  . fluticasone furoate-vilanterol (BREO ELLIPTA) 100-25 MCG/INH AEPB Inhale 1 puff into the lungs daily. 90 each 3  . gabapentin (NEURONTIN) 800 MG tablet Take 2 tablets (1,600 mg total) by mouth 2 (two) times daily. 360 tablet 3  . lamoTRIgine (LAMICTAL) 200 MG tablet Take 1 tablet (200 mg total) by mouth daily. 90 tablet 0  . levothyroxine (SYNTHROID, LEVOTHROID) 137 MCG tablet Take 1 tablet (137 mcg total) by mouth daily. 90 tablet 4  . levothyroxine (SYNTHROID, LEVOTHROID) 137 MCG tablet TAKE 1 TABLET(137 MCG) BY MOUTH DAILY 30 tablet 0  . LORazepam (ATIVAN) 0.5 MG tablet Take 0.5 mg by mouth every 8 (eight) hours as needed.    . methocarbamol (ROBAXIN) 500 MG tablet Take 1 tablet (500 mg total) by mouth 3 (three) times daily. 90 tablet 0  . montelukast (SINGULAIR) 10 MG tablet Take 1 tablet (10 mg total) by mouth at  bedtime. 90 tablet 3  . MYRBETRIQ 50 MG TB24 tablet TAKE 1 TABLET BY MOUTH EVERY DAY 30 tablet 0  . PARoxetine (PAXIL) 40 MG tablet TAKE 1 TABLET(40 MG) BY MOUTH EVERY MORNING 90 tablet 0  . predniSONE (DELTASONE) 50 MG tablet One tab PO daily for 5 days. 5 tablet 0  . rOPINIRole (REQUIP) 3 MG tablet Take 1 tablet (3 mg total) by mouth at bedtime. 30 tablet 3  . scopolamine (TRANSDERM-SCOP, 1.5 MG,) 1 MG/3DAYS Place 1 patch (1.5 mg total) onto the skin every 3 (three) days. 4 patch 3  . traZODone (DESYREL) 100 MG tablet TAKE 2 TO 3 TABLETS BY MOUTH AT NIGHT AS NEEDED FOR SLEEP  180 tablet 0  . [DISCONTINUED] mometasone (NASONEX) 50 MCG/ACT nasal spray Place 2 sprays into the nose daily. 17 g 12  . [DISCONTINUED] omeprazole (PRILOSEC) 20 MG capsule     . [DISCONTINUED] oxybutynin (DITROPAN-XL) 5 MG 24 hr tablet Take 1 tablet (5 mg total) by mouth daily. 30 tablet 3  . [DISCONTINUED] sucralfate (CARAFATE) 1 G tablet Take 2 tablets (2 g total) by mouth 2 (two) times daily before a meal. preferbly an hour before eating 120 tablet 6   No current facility-administered medications on file prior to visit.        Family History: Reviewed.  Family History   Problem  Relation  Age of Onset   .  Hypertension  Father    .  Heart disease  Sister  45      heart attack    .  Depression  Brother    .  Cancer  Mother  35      colon    Psychiatric Specialty Exam: General Appearance: Fairly Groomed  Eye Contact::  Good  Speech:  Clear and Coherent and Normal Rate  Volume:  Normal  Mood: somewhat down but not hopeless  Affect: congruent  Thought Process:  Loose  Orientation:  Full (Time, Place, and Person)  Thought Content:  WDL  Suicidal Thoughts:  No  Homicidal Thoughts:  No  Memory:  Immediate;   Good Recent;   Good Remote;   Good  Judgement:  Good  Insight:  Fair  Psychomotor Activity:  Normal  Concentration:  Fair  Recall:  Poor  Akathisia:  No  Handed:  Right  Fund of knowledge-average to above average  Language-Intact  AIMS (if indicated):     Assets:  Manufacturing systems engineer Desire for Improvement Financial Resources/Insurance Housing Intimacy Leisure Time Physical Health Resilience Social Support Talents/Skills Transportation Vocational/Educational  Language intact   Fund of knowledge is good      Assessment:  Major Depression, Recurrent severe-stable Alcohol Dependence in full sustained remission-stable AXIS I  Major Depression, Recurrent severe, Alcohol Dependence in full sustained remission . Fibromyalgia.     Treatment  Plan/Recommendations:   Major depression; not worse. Continue lamictal and paxil. Has meds for now. Now on 90 day refills Anxiety disorder; varies depending on stressl continue paxil Insomnia; cannot sleep without trazadone. Will continue Fibromyalgia; continue current medications follow with her primary care physician. Bad and cold weather does exacerbate her condition  Reviewed side effects provided psychotherapy discussed to over the opportunity to attend therapy here or in his activities during the day. No rash or otherwise side effects reported. Follow up in 3-4 months. Has meds for now can call for refills.    Thresa Ross, MD

## 2016-05-10 ENCOUNTER — Ambulatory Visit (INDEPENDENT_AMBULATORY_CARE_PROVIDER_SITE_OTHER): Payer: BLUE CROSS/BLUE SHIELD

## 2016-05-10 ENCOUNTER — Ambulatory Visit (INDEPENDENT_AMBULATORY_CARE_PROVIDER_SITE_OTHER): Payer: BLUE CROSS/BLUE SHIELD | Admitting: Sports Medicine

## 2016-05-10 DIAGNOSIS — E039 Hypothyroidism, unspecified: Secondary | ICD-10-CM

## 2016-05-10 DIAGNOSIS — H8113 Benign paroxysmal vertigo, bilateral: Secondary | ICD-10-CM

## 2016-05-10 DIAGNOSIS — Z87891 Personal history of nicotine dependence: Secondary | ICD-10-CM | POA: Diagnosis not present

## 2016-05-10 DIAGNOSIS — R0602 Shortness of breath: Secondary | ICD-10-CM

## 2016-05-10 DIAGNOSIS — J4489 Other specified chronic obstructive pulmonary disease: Secondary | ICD-10-CM

## 2016-05-10 DIAGNOSIS — J449 Chronic obstructive pulmonary disease, unspecified: Secondary | ICD-10-CM

## 2016-05-10 MED ORDER — MIRABEGRON ER 50 MG PO TB24: 50.0000 mg | ORAL_TABLET | Freq: Every day | ORAL | 3 refills | Status: DC

## 2016-05-10 NOTE — Progress Notes (Signed)
  Subjective:    CC: Shortness of breath  HPI: This is a 56 year old female, for the past few days she's had an odd shortness of breath after waking up that resolves very quickly, she had a spirometry test about 5 years ago that was normal. She is obese. No other pulmonary symptoms, no chest pain with exertion.  Vertigo: Has felt this before, agreeable to do vestibular rehabilitation.   Hypothyroidism: Due to recheck TSH  Past medical history:  Negative.  See flowsheet/record as well for more information.  Surgical history: Negative.  See flowsheet/record as well for more information.  Family history: Negative.  See flowsheet/record as well for more information.  Social history: Negative.  See flowsheet/record as well for more information.  Allergies, and medications have been entered into the medical record, reviewed, and no changes needed.   Review of Systems: No fevers, chills, night sweats, weight loss, chest pain, or shortness of breath.   Objective:    General: Well Developed, well nourished, and in no acute distress.  Neuro: Alert and oriented x3, extra-ocular muscles intact, sensation grossly intact.  HEENT: Normocephalic, atraumatic, pupils equal round reactive to light, neck supple, no masses, no lymphadenopathy, thyroid nonpalpable.  Skin: Warm and dry, no rashes. Cardiac: Regular rate and rhythm, no murmurs rubs or gallops, no lower extremity edema.  Respiratory: Clear to auscultation bilaterally. Not using accessory muscles, speaking in full sentences.  Impression and Recommendations:    Obstructive chronic bronchitis without exacerbation Checking routine blood work, BNP, d-dimer. Chest x-ray. Repeating spirometry. We did discuss weight loss and gastric sleeve.  Benign paroxysmal positional vertigo Referral to vestibular rehabilitation  Hypothyroidism Rechecking TSH.

## 2016-05-10 NOTE — Assessment & Plan Note (Signed)
Checking routine blood work, BNP, d-dimer. Chest x-ray. Repeating spirometry. We did discuss weight loss and gastric sleeve.

## 2016-05-10 NOTE — Assessment & Plan Note (Signed)
Rechecking TSH 

## 2016-05-10 NOTE — Assessment & Plan Note (Signed)
Referral to vestibular rehabilitation

## 2016-05-11 LAB — COMPREHENSIVE METABOLIC PANEL WITH GFR
ALT: 19 U/L (ref 6–29)
Albumin: 4.3 g/dL (ref 3.6–5.1)
Alkaline Phosphatase: 52 U/L (ref 33–130)
CO2: 26 mmol/L (ref 20–31)
Calcium: 9 mg/dL (ref 8.6–10.4)
Creat: 1.01 mg/dL (ref 0.50–1.05)
Glucose, Bld: 100 mg/dL — ABNORMAL HIGH (ref 65–99)
Sodium: 143 mmol/L (ref 135–146)

## 2016-05-11 LAB — CBC
HCT: 44 % (ref 35.0–45.0)
Hemoglobin: 14.9 g/dL (ref 11.7–15.5)
MCH: 31 pg (ref 27.0–33.0)
MCHC: 33.9 g/dL (ref 32.0–36.0)
MCV: 91.7 fL (ref 80.0–100.0)
MPV: 9.6 fL (ref 7.5–12.5)
Platelets: 165 10*3/uL (ref 140–400)
RBC: 4.8 MIL/uL (ref 3.80–5.10)
RDW: 14 % (ref 11.0–15.0)
WBC: 4.6 K/uL (ref 3.8–10.8)

## 2016-05-11 LAB — COMPREHENSIVE METABOLIC PANEL
AST: 17 U/L (ref 10–35)
BUN: 9 mg/dL (ref 7–25)
Chloride: 108 mmol/L (ref 98–110)
Potassium: 4.1 mmol/L (ref 3.5–5.3)
Total Bilirubin: 0.5 mg/dL (ref 0.2–1.2)
Total Protein: 6.6 g/dL (ref 6.1–8.1)

## 2016-05-11 LAB — BRAIN NATRIURETIC PEPTIDE: Brain Natriuretic Peptide: 35 pg/mL (ref <100)

## 2016-05-11 LAB — TSH: TSH: 1.78 mIU/L

## 2016-07-01 ENCOUNTER — Other Ambulatory Visit (HOSPITAL_COMMUNITY): Payer: Self-pay | Admitting: Psychiatry

## 2016-07-01 MED ORDER — TRAZODONE HCL 100 MG PO TABS: ORAL_TABLET | ORAL | 0 refills | Status: DC

## 2016-07-01 NOTE — Telephone Encounter (Signed)
Pt needs refill on trazadone.  appt is 7/11

## 2016-07-01 NOTE — Telephone Encounter (Signed)
trazadone medication sent to pharmacy

## 2016-07-12 ENCOUNTER — Ambulatory Visit (INDEPENDENT_AMBULATORY_CARE_PROVIDER_SITE_OTHER): Payer: BLUE CROSS/BLUE SHIELD | Admitting: Psychiatry

## 2016-07-12 ENCOUNTER — Encounter (HOSPITAL_COMMUNITY): Payer: Self-pay | Admitting: Psychiatry

## 2016-07-12 VITALS — BP 126/70 | HR 81 | Resp 16 | Ht 65.0 in | Wt 215.0 lb

## 2016-07-12 DIAGNOSIS — G4700 Insomnia, unspecified: Secondary | ICD-10-CM

## 2016-07-12 DIAGNOSIS — F411 Generalized anxiety disorder: Secondary | ICD-10-CM | POA: Diagnosis not present

## 2016-07-12 DIAGNOSIS — F332 Major depressive disorder, recurrent severe without psychotic features: Secondary | ICD-10-CM

## 2016-07-12 DIAGNOSIS — M797 Fibromyalgia: Secondary | ICD-10-CM

## 2016-07-12 DIAGNOSIS — F1021 Alcohol dependence, in remission: Secondary | ICD-10-CM

## 2016-07-12 DIAGNOSIS — Z818 Family history of other mental and behavioral disorders: Secondary | ICD-10-CM

## 2016-07-12 MED ORDER — TRAZODONE HCL 100 MG PO TABS: ORAL_TABLET | ORAL | 0 refills | Status: DC

## 2016-07-12 MED ORDER — LAMOTRIGINE 200 MG PO TABS: 200.0000 mg | ORAL_TABLET | Freq: Every day | ORAL | 0 refills | Status: DC

## 2016-07-12 MED ORDER — PAROXETINE HCL 40 MG PO TABS
ORAL_TABLET | ORAL | 0 refills | Status: DC
Start: 2016-07-12 — End: 2016-10-20

## 2016-07-12 NOTE — Progress Notes (Addendum)
Patient ID: Jennifer SchleinColleen M [name], female   DOB: 07/12/1960, 56 y.o.   MRN: [id number]   [facility]  Jennifer [name] 09/17/1960  Date:  07/12/2016  HPI :  Jennifer [name] is a 56 year old female with a diagnosis of major Depressive Disorder. 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   The patient returns for psychiatric services and  medication management.   Patient visited her daughter and then she came back she felt depressed that happens when she comes back visiting her family. Overall she has a very good supportive husband. She still cannot talk with her son and the grandkids that does upset her but she tries to keep self distracted. She is tolerating medications  No rash reported  somewhat dysthymic  . Severity: Depression: 7/10  Anxiety- baseline  Modifying factor: supportive husband    Review of Systems  Constitutional: Negative for fever.  Cardiovascular: Negative for chest pain and palpitations.  Gastrointestinal: Negative for nausea.  Musculoskeletal: Positive for myalgias.  Skin: Negative for rash.  Neurological: Negative for tremors and headaches.  Psychiatric/Behavioral: Negative for substance abuse and suicidal ideas.   Weight 206 lbs. Height 5\' 5"  pulse 90.   Physical Exam  Vitals reviewed.  Constitutional: She appears well-developed and well-nourished. No distress.  Skin: She is not diaphoretic.     Past Medical History: Reviewed.  Past Medical History   Diagnosis  Date   .  Discoid lupus    .  Thyroid disease    .  Perimenopausal    .  Fibromyalgia    .  CFS (chronic fatigue syndrome)    .  Sleep apnea      Allergies: Reviewed.  Allergies   Allergen  Reactions   .  Acetaminophen      Pt states she is unable to take this bc of Hashimoto Thyroid   .  Hydrocodone-Acetaminophen      REACTION: agitation   .  Oxycodone      Hallucinating, sweating, itching    Current Medications: Reviewed.  Current  Outpatient Prescriptions on File Prior to Visit  Medication Sig Dispense Refill  . atorvastatin (LIPITOR) 40 MG tablet Take 1 tablet (40 mg total) by mouth daily. 90 tablet 1  . fluticasone furoate-vilanterol (BREO ELLIPTA) 100-25 MCG/INH AEPB Inhale 1 puff into the lungs daily. 90 each 3  . gabapentin (NEURONTIN) 800 MG tablet TAKE 2 TABLETS(1600 MG) BY MOUTH TWICE DAILY 120 tablet 0  . levothyroxine (SYNTHROID, LEVOTHROID) 137 MCG tablet TAKE 1 TABLET(137 MCG) BY MOUTH DAILY 30 tablet 0  . mirabegron ER (MYRBETRIQ) 50 MG TB24 tablet Take 1 tablet (50 mg total) by mouth daily. 90 tablet 3  . montelukast (SINGULAIR) 10 MG tablet Take 1 tablet (10 mg total) by mouth at bedtime. 90 tablet 3  . tapentadol (NUCYNTA) 50 MG tablet Take by mouth.    Marland Kitchen. tiZANidine (ZANAFLEX) 4 MG tablet     . [DISCONTINUED] mometasone (NASONEX) 50 MCG/ACT nasal spray Place 2 sprays into the nose daily. 17 g 12  . [DISCONTINUED] omeprazole (PRILOSEC) 20 MG capsule     . [DISCONTINUED] oxybutynin (DITROPAN-XL) 5 MG 24 hr tablet Take 1 tablet (5 mg total) by mouth daily. 30 tablet 3  . [DISCONTINUED] sucralfate (CARAFATE) 1 G tablet Take 2 tablets (2 g total) by mouth 2 (two) times daily before a meal. preferbly an hour before eating 120 tablet 6   No current facility-administered medications on file prior to visit.  Family History: Reviewed.  Family History   Problem  Relation  Age of Onset   .  Hypertension  Father    .  Heart disease  Sister  45      heart attack    .  Depression  Brother    .  Cancer  Mother  47      colon    Psychiatric Specialty Exam: General Appearance: Fairly Groomed  Patent attorney::  Good  Speech:  Clear and Coherent and Normal Rate  Volume:  Normal  Mood: somewhat sad  Affect: congruent  Thought Process:  Loose  Orientation:  Full (Time, Place, and Person)  Thought Content:  WDL  Suicidal Thoughts:  No  Homicidal Thoughts:  No  Memory:  Immediate;   Good Recent;    Good Remote;   Good  Judgement:  Good  Insight:  Fair  Psychomotor Activity:  Normal  Concentration:  Fair  Recall:  Poor  Akathisia:  No  Handed:  Right  Fund of knowledge-average to above average  Language-Intact  AIMS (if indicated):     Assets:  Manufacturing systems engineer Desire for Improvement Financial Resources/Insurance Housing Intimacy Leisure Time Physical Health Resilience Social Support Talents/Skills Transportation Vocational/Educational  Language intact   Fund of knowledge is good      Assessment:  Major Depression, Recurrent severe-stable Alcohol Dependence in full sustained remission-stable AXIS I  Major Depression, Recurrent severe, Alcohol Dependence in full sustained remission . Fibromyalgia.     Treatment Plan/Recommendations:   Major depression; somewhat sad but not worse. Continue meds, paxil. lamictal Anxiety disorder; fluctuates. Continue paxil Insomnia; does ok with trazadone Fibromyalgia; baseline. Also on neurontin. Continue paxil  Questions addressed, side effects reviewed, keep busy with acitivities FU 3 months.  No alcohol use. 90 day supply meds sent  Thresa Ross, MD

## 2016-07-13 ENCOUNTER — Ambulatory Visit (HOSPITAL_COMMUNITY): Payer: Self-pay | Admitting: Psychiatry

## 2016-08-09 DIAGNOSIS — M4802 Spinal stenosis, cervical region: Secondary | ICD-10-CM | POA: Diagnosis not present

## 2016-08-09 DIAGNOSIS — M533 Sacrococcygeal disorders, not elsewhere classified: Secondary | ICD-10-CM | POA: Diagnosis not present

## 2016-08-09 DIAGNOSIS — M5136 Other intervertebral disc degeneration, lumbar region: Secondary | ICD-10-CM | POA: Diagnosis not present

## 2016-08-09 DIAGNOSIS — Z5181 Encounter for therapeutic drug level monitoring: Secondary | ICD-10-CM | POA: Diagnosis not present

## 2016-08-09 DIAGNOSIS — Z79899 Other long term (current) drug therapy: Secondary | ICD-10-CM | POA: Diagnosis not present

## 2016-08-09 DIAGNOSIS — M5030 Other cervical disc degeneration, unspecified cervical region: Secondary | ICD-10-CM | POA: Diagnosis not present

## 2016-08-24 DIAGNOSIS — M545 Low back pain: Secondary | ICD-10-CM | POA: Diagnosis not present

## 2016-08-24 DIAGNOSIS — M2578 Osteophyte, vertebrae: Secondary | ICD-10-CM | POA: Diagnosis not present

## 2016-08-24 DIAGNOSIS — M47896 Other spondylosis, lumbar region: Secondary | ICD-10-CM | POA: Diagnosis not present

## 2016-08-24 DIAGNOSIS — M5136 Other intervertebral disc degeneration, lumbar region: Secondary | ICD-10-CM | POA: Diagnosis not present

## 2016-09-12 DIAGNOSIS — M5030 Other cervical disc degeneration, unspecified cervical region: Secondary | ICD-10-CM | POA: Diagnosis not present

## 2016-09-12 DIAGNOSIS — M5136 Other intervertebral disc degeneration, lumbar region: Secondary | ICD-10-CM | POA: Diagnosis not present

## 2016-09-12 DIAGNOSIS — M533 Sacrococcygeal disorders, not elsewhere classified: Secondary | ICD-10-CM | POA: Diagnosis not present

## 2016-09-12 DIAGNOSIS — M4802 Spinal stenosis, cervical region: Secondary | ICD-10-CM | POA: Diagnosis not present

## 2016-10-14 ENCOUNTER — Other Ambulatory Visit: Payer: Self-pay | Admitting: Sports Medicine

## 2016-10-14 ENCOUNTER — Other Ambulatory Visit (HOSPITAL_COMMUNITY): Payer: Self-pay | Admitting: Psychiatry

## 2016-10-16 ENCOUNTER — Other Ambulatory Visit (HOSPITAL_COMMUNITY): Payer: Self-pay | Admitting: Psychiatry

## 2016-10-18 DIAGNOSIS — M533 Sacrococcygeal disorders, not elsewhere classified: Secondary | ICD-10-CM | POA: Diagnosis not present

## 2016-10-18 DIAGNOSIS — M4802 Spinal stenosis, cervical region: Secondary | ICD-10-CM | POA: Diagnosis not present

## 2016-10-18 DIAGNOSIS — M5030 Other cervical disc degeneration, unspecified cervical region: Secondary | ICD-10-CM | POA: Diagnosis not present

## 2016-10-18 DIAGNOSIS — M5136 Other intervertebral disc degeneration, lumbar region: Secondary | ICD-10-CM | POA: Diagnosis not present

## 2016-10-20 ENCOUNTER — Ambulatory Visit (INDEPENDENT_AMBULATORY_CARE_PROVIDER_SITE_OTHER): Payer: BLUE CROSS/BLUE SHIELD | Admitting: Psychiatry

## 2016-10-20 ENCOUNTER — Encounter (HOSPITAL_COMMUNITY): Payer: Self-pay | Admitting: Psychiatry

## 2016-10-20 DIAGNOSIS — F1021 Alcohol dependence, in remission: Secondary | ICD-10-CM | POA: Diagnosis not present

## 2016-10-20 DIAGNOSIS — F332 Major depressive disorder, recurrent severe without psychotic features: Secondary | ICD-10-CM

## 2016-10-20 DIAGNOSIS — G4700 Insomnia, unspecified: Secondary | ICD-10-CM

## 2016-10-20 DIAGNOSIS — Z818 Family history of other mental and behavioral disorders: Secondary | ICD-10-CM | POA: Diagnosis not present

## 2016-10-20 DIAGNOSIS — F411 Generalized anxiety disorder: Secondary | ICD-10-CM

## 2016-10-20 DIAGNOSIS — M797 Fibromyalgia: Secondary | ICD-10-CM | POA: Diagnosis not present

## 2016-10-20 MED ORDER — TRAZODONE HCL 100 MG PO TABS: ORAL_TABLET | ORAL | 0 refills | Status: DC

## 2016-10-20 MED ORDER — PAROXETINE HCL 40 MG PO TABS: ORAL_TABLET | ORAL | 0 refills | Status: DC

## 2016-10-20 MED ORDER — LAMOTRIGINE 200 MG PO TABS: 200.0000 mg | ORAL_TABLET | Freq: Every day | ORAL | 0 refills | Status: DC

## 2016-10-20 NOTE — Progress Notes (Signed)
Patient ID: Jennifer SchleinColleen M [name], female   DOB: 06/13/1960, 56 y.o.   MRN: [id number]   [facility]  Jennifer SchleinColleen M [name] 09/18/1960  Date:  07/12/2016  HPI :  Jennifer [name] is a 56 year old female with a diagnosis of major Depressive Disorder. 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   The patient returns for psychiatric services and  medication management.   She enjoyed the visits to her daughter she remains worried about her son who is not communicating for the last 1 year and cannot see the grandkids. Fiber-optic keeps her awake at night that is her main concern other than that mood wise she does feel more balance on the current medications of lamotrigine   No rash reported   . Severity: Depression: same not worse. 7/10 Anxiety- baseline  Modifying factor: supportive husband    Review of Systems  Constitutional: Negative for fever.  Cardiovascular: Negative for chest pain and palpitations.  Gastrointestinal: Negative for nausea.  Musculoskeletal: Positive for myalgias.  Skin: Negative for itching.  Neurological: Negative for tremors and headaches.  Psychiatric/Behavioral: Negative for substance abuse and suicidal ideas.   Weight 206 lbs. Height 5\' 5"  pulse 90.   Physical Exam  Vitals reviewed.  Constitutional: She appears well-developed and well-nourished. No distress.  Skin: She is not diaphoretic.     Past Medical History: Reviewed.  Past Medical History   Diagnosis  Date   .  Discoid lupus    .  Thyroid disease    .  Perimenopausal    .  Fibromyalgia    .  CFS (chronic fatigue syndrome)    .  Sleep apnea      Allergies: Reviewed.  Allergies   Allergen  Reactions   .  Acetaminophen      Pt states she is unable to take this bc of Hashimoto Thyroid   .  Hydrocodone-Acetaminophen      REACTION: agitation   .  Oxycodone      Hallucinating, sweating, itching    Current Medications: Reviewed.  Current Outpatient  Prescriptions on File Prior to Visit  Medication Sig Dispense Refill  . atorvastatin (LIPITOR) 40 MG tablet TAKE 1 TABLET DAILY 90 tablet 1  . fluticasone furoate-vilanterol (BREO ELLIPTA) 100-25 MCG/INH AEPB Inhale 1 puff into the lungs daily. 90 each 3  . gabapentin (NEURONTIN) 800 MG tablet TAKE 2 TABLETS(1600 MG) BY MOUTH TWICE DAILY 120 tablet 0  . levothyroxine (SYNTHROID, LEVOTHROID) 137 MCG tablet TAKE 1 TABLET(137 MCG) BY MOUTH DAILY 30 tablet 0  . mirabegron ER (MYRBETRIQ) 50 MG TB24 tablet Take 1 tablet (50 mg total) by mouth daily. 90 tablet 3  . montelukast (SINGULAIR) 10 MG tablet Take 1 tablet (10 mg total) by mouth at bedtime. 90 tablet 3  . tapentadol (NUCYNTA) 50 MG tablet Take by mouth.    Marland Kitchen. tiZANidine (ZANAFLEX) 4 MG tablet     . [DISCONTINUED] mometasone (NASONEX) 50 MCG/ACT nasal spray Place 2 sprays into the [name] daily. 17 g 12  . [DISCONTINUED] omeprazole (PRILOSEC) 20 MG capsule     . [DISCONTINUED] oxybutynin (DITROPAN-XL) 5 MG 24 hr tablet Take 1 tablet (5 mg total) by mouth daily. 30 tablet 3  . [DISCONTINUED] sucralfate (CARAFATE) 1 G tablet Take 2 tablets (2 g total) by mouth 2 (two) times daily before a meal. preferbly an hour before eating 120 tablet 6   No current facility-administered medications on file prior to visit.  Family History: Reviewed.  Family History   Problem  Relation  Age of Onset   .  Hypertension  Father    .  Heart disease  Sister  45      heart attack    .  Depression  Brother    .  Cancer  Mother  71      colon    Psychiatric Specialty Exam: General Appearance: Fairly Groomed  Eye Contact::  Good  Speech:  Clear and Coherent and Normal Rate  Volume:  Normal  Mood: subdued but not dysphoric  Affect: congruent  Thought Process:  Loose  Orientation:  Full (Time, Place, and Person)  Thought Content:  WDL  Suicidal Thoughts:  No  Homicidal Thoughts:  No  Memory:  Immediate;   Good Recent;   Good Remote;   Good   Judgement:  Good  Insight:  Fair  Psychomotor Activity:  Normal  Concentration:  Fair  Recall:  Poor  Akathisia:  No  Handed:  Right  Fund of knowledge-average to above average  Language-Intact  AIMS (if indicated):     Assets:  Manufacturing systems engineer Desire for Improvement Financial Resources/Insurance Housing Intimacy Leisure Time Physical Health Resilience Social Support Talents/Skills Transportation Vocational/Educational  Language intact   Fund of knowledge is good      Assessment:  Major Depression, Recurrent severe-stable Alcohol Dependence in full sustained remission-stable AXIS I  Major Depression, Recurrent severe, Alcohol Dependence in full sustained remission . Fibromyalgia.     Treatment Plan/Recommendations:   Major depression; not worse. Continue paxil and lamictal Anxiety disorder; at times gets under more stress. Not worse continue paxil Insomnia; difficult to sleep at times. Wanted to increase trazadone.she is already averaging above 200mg . Will keep the same and reviewed sleep hygiene Fibromyalgia; baseline, on paxil on neurontin  Provided supportive therapy. Reviewed meds and concerns Fu 3 months. 90 day supply meds  Thresa Ross, MD

## 2016-10-27 DIAGNOSIS — M533 Sacrococcygeal disorders, not elsewhere classified: Secondary | ICD-10-CM | POA: Diagnosis not present

## 2016-10-27 DIAGNOSIS — Z79899 Other long term (current) drug therapy: Secondary | ICD-10-CM | POA: Diagnosis not present

## 2016-10-27 DIAGNOSIS — Z885 Allergy status to narcotic agent status: Secondary | ICD-10-CM | POA: Diagnosis not present

## 2016-10-27 DIAGNOSIS — M797 Fibromyalgia: Secondary | ICD-10-CM | POA: Diagnosis not present

## 2016-10-27 DIAGNOSIS — Z87891 Personal history of nicotine dependence: Secondary | ICD-10-CM | POA: Diagnosis not present

## 2016-10-27 DIAGNOSIS — M329 Systemic lupus erythematosus, unspecified: Secondary | ICD-10-CM | POA: Diagnosis not present

## 2016-10-27 DIAGNOSIS — J449 Chronic obstructive pulmonary disease, unspecified: Secondary | ICD-10-CM | POA: Diagnosis not present

## 2016-10-27 DIAGNOSIS — F419 Anxiety disorder, unspecified: Secondary | ICD-10-CM | POA: Diagnosis not present

## 2016-10-27 DIAGNOSIS — Z7951 Long term (current) use of inhaled steroids: Secondary | ICD-10-CM | POA: Diagnosis not present

## 2016-10-27 DIAGNOSIS — Z886 Allergy status to analgesic agent status: Secondary | ICD-10-CM | POA: Diagnosis not present

## 2016-11-09 ENCOUNTER — Encounter: Payer: Self-pay | Admitting: Sports Medicine

## 2016-11-09 ENCOUNTER — Ambulatory Visit (INDEPENDENT_AMBULATORY_CARE_PROVIDER_SITE_OTHER): Payer: BLUE CROSS/BLUE SHIELD

## 2016-11-09 ENCOUNTER — Ambulatory Visit: Payer: BLUE CROSS/BLUE SHIELD | Admitting: Sports Medicine

## 2016-11-09 DIAGNOSIS — J449 Chronic obstructive pulmonary disease, unspecified: Secondary | ICD-10-CM

## 2016-11-09 DIAGNOSIS — R062 Wheezing: Secondary | ICD-10-CM | POA: Diagnosis not present

## 2016-11-09 DIAGNOSIS — J4489 Other specified chronic obstructive pulmonary disease: Secondary | ICD-10-CM

## 2016-11-09 DIAGNOSIS — N8110 Cystocele, unspecified: Secondary | ICD-10-CM | POA: Diagnosis not present

## 2016-11-09 DIAGNOSIS — R05 Cough: Secondary | ICD-10-CM | POA: Diagnosis not present

## 2016-11-09 MED ORDER — PREDNISONE 50 MG PO TABS: ORAL_TABLET | ORAL | 0 refills | Status: DC

## 2016-11-09 MED ORDER — AZITHROMYCIN 250 MG PO TABS: ORAL_TABLET | ORAL | 0 refills | Status: DC

## 2016-11-09 MED ORDER — MIRABEGRON ER 50 MG PO TB24: 50.0000 mg | ORAL_TABLET | Freq: Every day | ORAL | 3 refills | Status: DC

## 2016-11-09 MED ORDER — IPRATROPIUM-ALBUTEROL 0.5-2.5 (3) MG/3ML IN SOLN: 3.0000 mL | Freq: Four times a day (QID) | RESPIRATORY_TRACT | 3 refills | Status: DC | PRN

## 2016-11-09 NOTE — Assessment & Plan Note (Signed)
Refilling Myrbetriq

## 2016-11-09 NOTE — Assessment & Plan Note (Signed)
Suspect some degree of asthma/reactive airways. Wheezing, coughing, chest tightness.   Prednisone, azithromycin, chest x-ray. Refilling her duo nebs.

## 2016-11-09 NOTE — Progress Notes (Signed)
  Subjective:    CC: Wheezing  HPI: This is a 56 year old female with a history of reactive airway diseases, no true COPD on lung function test, she has been well controlled on Brio.  For the past couple of weeks she had increasing cough, wheeze, shortness of breath and chest tightness.  No constitutional symptoms, no chest pain.  Pain is nonexertional.  In addition she needs a refill on her Myrbetriq for overactive bladder.  Past medical history:  Negative.  See flowsheet/record as well for more information.  Surgical history: Negative.  See flowsheet/record as well for more information.  Family history: Negative.  See flowsheet/record as well for more information.  Social history: Negative.  See flowsheet/record as well for more information.  Allergies, and medications have been entered into the medical record, reviewed, and no changes needed.   Review of Systems: No fevers, chills, night sweats, weight loss, chest pain, or shortness of breath.   Objective:    General: Well Developed, well nourished, and in no acute distress.  Neuro: Alert and oriented x3, extra-ocular muscles intact, sensation grossly intact.  HEENT: Normocephalic, atraumatic, pupils equal round reactive to light, neck supple, no masses, no lymphadenopathy, thyroid nonpalpable.  Skin: Warm and dry, no rashes. Cardiac: Regular rate and rhythm, no murmurs rubs or gallops, no lower extremity edema.  Respiratory: Diffuse expiratory wheezes, no crackles or rales. Not using accessory muscles, speaking in full sentences.  Impression and Recommendations:    Obstructive chronic bronchitis without exacerbation Suspect some degree of asthma/reactive airways. Wheezing, coughing, chest tightness.   Prednisone, azithromycin, chest x-ray. Refilling her duo nebs.   Female bladder prolapse with associated stress incontinence Refilling Myrbetriq  ___________________________________________ Ihor Austinhomas J. Benjamin Stainhekkekandam, M.D., ABFM.,  CAQSM. Primary Care and Sports Medicine [facility]  Adjunct Instructor of Family Medicine  [employer]

## 2016-12-09 DIAGNOSIS — M549 Dorsalgia, unspecified: Secondary | ICD-10-CM | POA: Diagnosis not present

## 2016-12-09 DIAGNOSIS — M5030 Other cervical disc degeneration, unspecified cervical region: Secondary | ICD-10-CM | POA: Diagnosis not present

## 2016-12-09 DIAGNOSIS — M533 Sacrococcygeal disorders, not elsewhere classified: Secondary | ICD-10-CM | POA: Diagnosis not present

## 2016-12-09 DIAGNOSIS — M5136 Other intervertebral disc degeneration, lumbar region: Secondary | ICD-10-CM | POA: Diagnosis not present

## 2017-01-06 ENCOUNTER — Telehealth: Payer: Self-pay | Admitting: Sports Medicine

## 2017-01-06 ENCOUNTER — Other Ambulatory Visit (HOSPITAL_COMMUNITY): Payer: Self-pay | Admitting: Psychiatry

## 2017-01-10 ENCOUNTER — Ambulatory Visit: Payer: Self-pay | Admitting: Physician Assistant

## 2017-01-12 ENCOUNTER — Other Ambulatory Visit (HOSPITAL_COMMUNITY): Payer: Self-pay | Admitting: Psychiatry

## 2017-01-20 ENCOUNTER — Telehealth: Payer: Self-pay | Admitting: Sports Medicine

## 2017-01-20 NOTE — Telephone Encounter (Signed)
Because she is not disabled, I circled 1 month because I want her to get to the point where she does not need a handicapped placard.

## 2017-01-20 NOTE — Telephone Encounter (Signed)
Patient came in office to pick up handicap placard form and is requesting to know why it was only circled for 1 month and not longer. Adv pt will send a message. Pt request a nurse to call her back. Thanks

## 2017-01-23 DIAGNOSIS — M5030 Other cervical disc degeneration, unspecified cervical region: Secondary | ICD-10-CM | POA: Diagnosis not present

## 2017-01-23 DIAGNOSIS — M533 Sacrococcygeal disorders, not elsewhere classified: Secondary | ICD-10-CM | POA: Diagnosis not present

## 2017-01-23 DIAGNOSIS — Z79899 Other long term (current) drug therapy: Secondary | ICD-10-CM | POA: Diagnosis not present

## 2017-01-23 DIAGNOSIS — M549 Dorsalgia, unspecified: Secondary | ICD-10-CM | POA: Diagnosis not present

## 2017-01-23 DIAGNOSIS — M5136 Other intervertebral disc degeneration, lumbar region: Secondary | ICD-10-CM | POA: Diagnosis not present

## 2017-01-23 DIAGNOSIS — Z5181 Encounter for therapeutic drug level monitoring: Secondary | ICD-10-CM | POA: Diagnosis not present

## 2017-01-23 NOTE — Telephone Encounter (Signed)
Pt given information and verbalized understanding.  

## 2017-01-30 ENCOUNTER — Other Ambulatory Visit (HOSPITAL_COMMUNITY): Payer: Self-pay | Admitting: Psychiatry

## 2017-01-30 MED ORDER — TRAZODONE HCL 100 MG PO TABS: ORAL_TABLET | ORAL | 0 refills | Status: DC

## 2017-01-30 NOTE — Telephone Encounter (Signed)
Pt needs refill on trazadone, pt has an appt 1/31

## 2017-01-30 NOTE — Telephone Encounter (Signed)
Sent over a refill for Trazodone per Dr. Gilmore LarocheAkhtar. Informed patient. Nothing further is needed at this time.

## 2017-01-30 NOTE — Telephone Encounter (Signed)
Can refill if cant wait till 2 days

## 2017-02-02 ENCOUNTER — Ambulatory Visit (HOSPITAL_COMMUNITY): Payer: Self-pay | Admitting: Psychiatry

## 2017-02-07 ENCOUNTER — Encounter: Payer: Self-pay | Admitting: *Deleted

## 2017-02-07 ENCOUNTER — Emergency Department
Admission: EM | Admit: 2017-02-07 | Discharge: 2017-02-07 | Disposition: A | Discharge status: Home / Self Care | Payer: BLUE CROSS/BLUE SHIELD | Source: Home / Self Care | Attending: Family Medicine | Admitting: Family Medicine

## 2017-02-07 DIAGNOSIS — F419 Anxiety disorder, unspecified: Secondary | ICD-10-CM | POA: Diagnosis not present

## 2017-02-07 DIAGNOSIS — M797 Fibromyalgia: Secondary | ICD-10-CM | POA: Diagnosis not present

## 2017-02-07 DIAGNOSIS — M545 Low back pain, unspecified: Secondary | ICD-10-CM

## 2017-02-07 DIAGNOSIS — E039 Hypothyroidism, unspecified: Secondary | ICD-10-CM | POA: Diagnosis not present

## 2017-02-07 DIAGNOSIS — J45909 Unspecified asthma, uncomplicated: Secondary | ICD-10-CM | POA: Diagnosis not present

## 2017-02-07 DIAGNOSIS — R918 Other nonspecific abnormal finding of lung field: Secondary | ICD-10-CM | POA: Diagnosis not present

## 2017-02-07 DIAGNOSIS — R0902 Hypoxemia: Secondary | ICD-10-CM | POA: Diagnosis not present

## 2017-02-07 DIAGNOSIS — R0602 Shortness of breath: Secondary | ICD-10-CM | POA: Diagnosis not present

## 2017-02-07 DIAGNOSIS — Z885 Allergy status to narcotic agent status: Secondary | ICD-10-CM | POA: Diagnosis not present

## 2017-02-07 DIAGNOSIS — Z7951 Long term (current) use of inhaled steroids: Secondary | ICD-10-CM | POA: Diagnosis not present

## 2017-02-07 DIAGNOSIS — M329 Systemic lupus erythematosus, unspecified: Secondary | ICD-10-CM | POA: Diagnosis not present

## 2017-02-07 DIAGNOSIS — J189 Pneumonia, unspecified organism: Secondary | ICD-10-CM | POA: Diagnosis not present

## 2017-02-07 DIAGNOSIS — R197 Diarrhea, unspecified: Secondary | ICD-10-CM

## 2017-02-07 DIAGNOSIS — Z886 Allergy status to analgesic agent status: Secondary | ICD-10-CM | POA: Diagnosis not present

## 2017-02-07 DIAGNOSIS — F329 Major depressive disorder, single episode, unspecified: Secondary | ICD-10-CM | POA: Diagnosis not present

## 2017-02-07 DIAGNOSIS — Z79899 Other long term (current) drug therapy: Secondary | ICD-10-CM | POA: Diagnosis not present

## 2017-02-07 DIAGNOSIS — J440 Chronic obstructive pulmonary disease with acute lower respiratory infection: Secondary | ICD-10-CM | POA: Diagnosis not present

## 2017-02-07 DIAGNOSIS — R509 Fever, unspecified: Secondary | ICD-10-CM

## 2017-02-07 DIAGNOSIS — J9601 Acute respiratory failure with hypoxia: Secondary | ICD-10-CM | POA: Diagnosis not present

## 2017-02-07 DIAGNOSIS — R112 Nausea with vomiting, unspecified: Secondary | ICD-10-CM

## 2017-02-07 DIAGNOSIS — G8929 Other chronic pain: Secondary | ICD-10-CM

## 2017-02-07 DIAGNOSIS — Z87891 Personal history of nicotine dependence: Secondary | ICD-10-CM | POA: Diagnosis not present

## 2017-02-07 DIAGNOSIS — R7989 Other specified abnormal findings of blood chemistry: Secondary | ICD-10-CM | POA: Diagnosis not present

## 2017-02-07 DIAGNOSIS — J441 Chronic obstructive pulmonary disease with (acute) exacerbation: Secondary | ICD-10-CM | POA: Diagnosis not present

## 2017-02-07 MED ORDER — ONDANSETRON 4 MG PO TBDP
4.0000 mg | ORAL_TABLET | Freq: Once | ORAL | Status: AC
  Administered 2017-02-07: 4 mg via ORAL

## 2017-02-07 MED ORDER — SODIUM CHLORIDE 0.9 % IV BOLUS (SEPSIS)
1000.0000 mL | Freq: Once | INTRAVENOUS | Status: AC
  Administered 2017-02-07: 1000 mL via INTRAVENOUS

## 2017-02-07 MED ORDER — KETOROLAC TROMETHAMINE 60 MG/2ML IM SOLN
60.0000 mg | Freq: Once | INTRAMUSCULAR | Status: AC
  Administered 2017-02-07: 60 mg via INTRAMUSCULAR

## 2017-02-07 NOTE — ED Provider Notes (Signed)
Jennifer [name] CARE    CSN: [id number] Arrival date & time: 02/07/17  0959     History   Chief Complaint Chief Complaint  Patient presents with  . Nausea    HPI Jennifer [name] is a 57 y.o. female.   HPI Jennifer [name] is a 57 y.o. female brought to [facility] by her husband with c/o sudden onset n/v/d, SOB, and low back pain that started this morning.  She had about 2-3 episodes of vomiting and diarrhea along with dry heaves. Pt requesting to go to the hospital stating her husband should have taken her there.  She is c/o severe thirst.  Pain in back is 8/10 but states she is aching all over.  She is unsure of a fever.  No medication taken PTA.  Pt reports hx of COPD but she is not usually on oxygen. She uses a nebulizer machine from time to time when weather changes or she gets sick but has not needed to use it in over 1 year.  Denies sick contacts. She did not get the flu vaccine this year as pt states she "never goes out."  Denies abdominal pain or chest pain at this time.    Past Medical History:  Diagnosis Date  . Alcohol abuse   . Asthmatic bronchitis 2013  . CFS (chronic fatigue syndrome)   . Discoid lupus   . Fibromyalgia   . GERD (gastroesophageal reflux disease) 2013  . Hyperlipidemia   . Postartificial menopausal syndrome   . Sleep apnea   . Thyroid disease     Patient Active Problem List   Diagnosis Date Noted  . Perennial allergic rhinitis 02/05/2016  . Restless leg syndrome 03/16/2015  . Benign paroxysmal positional vertigo 03/16/2015  . Irritable bowel syndrome with constipation 01/16/2014  . DDD (degenerative disc disease), cervical 09/11/2012  . Menopausal syndrome 06/12/2012  . Hyperlipidemia with target low density lipoprotein (LDL) cholesterol less than 100 mg/dL 86/57/8469  . Preventive measure 02/06/2012  . Low back pain 01/25/2012  . Obstructive chronic bronchitis without exacerbation (HCC) 03/17/2011  . Discoid lupus   . Hypothyroidism   . Sleep  apnea   . Major depressive disorder, recurrent episode, severe (HCC) 03/10/2011  . Female bladder prolapse with associated stress incontinence 04/17/2006  . Fibromyalgia 04/06/2006    Past Surgical History:  Procedure Laterality Date  . ABDOMINAL HYSTERECTOMY  09/2007   TAH with bladder sling  . NASAL SEPTUM SURGERY    . orto surgery plate in leg    . TUBAL LIGATION     bilateral  . uvuloplasty      OB History    Gravida Para Term Preterm AB Living   5 4     1 4    SAB TAB Ectopic Multiple Live Births   1               Home Medications    Prior to Admission medications   Medication Sig Start Date End Date Taking? Authorizing Provider  atorvastatin (LIPITOR) 40 MG tablet TAKE 1 TABLET DAILY 10/16/16   Monica Becton, MD  fluticasone furoate-vilanterol (BREO ELLIPTA) 100-25 MCG/INH AEPB Inhale 1 puff into the lungs daily. 03/29/16   Monica Becton, MD  gabapentin (NEURONTIN) 800 MG tablet TAKE 2 TABLETS(1600 MG) BY MOUTH TWICE DAILY 04/18/16   Monica Becton, MD  ipratropium-albuterol (DUONEB) 0.5-2.5 (3) MG/3ML SOLN Take 3 mLs every 6 (six) hours as needed by nebulization. 11/09/16   Monica Becton, MD  lamoTRIgine (LAMICTAL) 200 MG tablet Take 1 tablet (200 mg total) by mouth daily. 10/20/16   Thresa Ross, MD  levothyroxine (SYNTHROID, LEVOTHROID) 137 MCG tablet TAKE 1 TABLET(137 MCG) BY MOUTH DAILY 04/18/16   Monica Becton, MD  mirabegron ER (MYRBETRIQ) 50 MG TB24 tablet Take 1 tablet (50 mg total) daily by mouth. 11/09/16   Monica Becton, MD  montelukast (SINGULAIR) 10 MG tablet Take 1 tablet (10 mg total) by mouth at bedtime. 03/29/16   Monica Becton, MD  PARoxetine (PAXIL) 40 MG tablet TAKE 1 TABLET(40 MG) BY MOUTH EVERY MORNING 10/20/16   Thresa Ross, MD  tapentadol (NUCYNTA) 50 MG tablet Take by mouth.    [provider]  tiZANidine (ZANAFLEX) 4 MG tablet  05/01/16   [provider]  traZODone  (DESYREL) 100 MG tablet TAKE 2 TO 3 TABLETS BY MOUTH AT NIGHT AS NEEDED FOR SLEEP 01/30/17   Thresa Ross, MD  mometasone (NASONEX) 50 MCG/ACT nasal spray Place 2 sprays into the nose daily. 03/04/11 03/17/11  Jomarie Longs, PA-C  omeprazole (PRILOSEC) 20 MG capsule  05/27/10 03/17/11  [provider]  oxybutynin (DITROPAN-XL) 5 MG 24 hr tablet Take 1 tablet (5 mg total) by mouth daily. 09/06/11 01/27/12  Hassan Rowan, MD  sucralfate (CARAFATE) 1 G tablet Take 2 tablets (2 g total) by mouth 2 (two) times daily before a meal. preferbly an hour before eating 09/28/10 03/17/11  Hassan Rowan, MD    Family History Family History  Problem Relation Age of Onset  . Hypertension Father   . Alcohol abuse Father   . Heart disease Sister 45       heart attack  . Heart attack Sister   . Depression Brother   . Alcohol abuse Brother        In remission  . Cancer Mother 50       colon  . Depression Brother   . Alcohol abuse Maternal Aunt   . Alcohol abuse Paternal Uncle        in remission  . Bipolar disorder Daughter 28       Suicide attempt    Social History Social History   Tobacco Use  . Smoking status: Former Smoker    Packs/day: 0.30    Years: 36.00    Pack years: 10.80    Types: Cigarettes  . Smokeless tobacco: Never Used  . Tobacco comment: Started smoking at age 42.  Substance Use Topics  . Alcohol use: No    Comment: quit 01-2006/prior alcoholic-in AA  . Drug use: No     Allergies   Acetaminophen; Prednisone; Hydrocodone-acetaminophen; and Oxycodone   Review of Systems Review of Systems  Constitutional: Positive for chills, fatigue and fever.  HENT: Negative for congestion, ear pain, sore throat, trouble swallowing and voice change.   Respiratory: Negative for cough and shortness of breath.   Cardiovascular: Negative for chest pain and palpitations.  Gastrointestinal: Positive for diarrhea, nausea and vomiting. Negative for abdominal pain and blood in stool.    Musculoskeletal: Positive for arthralgias, back pain and myalgias.  Skin: Negative for rash.  Neurological: Positive for headaches. Negative for dizziness and light-headedness.     Physical Exam Triage Vital Signs ED Triage Vitals  Enc Vitals Group     BP      Pulse      Resp      Temp      Temp src      SpO2  Weight      Height      Head Circumference      Peak Flow      Pain Score      Pain Loc      Pain Edu?      Excl. in GC?    No data found.  Updated Vital Signs BP (!) 171/109 (BP Location: Left Arm)   Pulse (!) 129   Temp (!) 101 F (38.3 C) (Oral)   Resp 20   SpO2 90%      Physical Exam  Constitutional: She is oriented to person, place, and time. She appears well-developed and well-nourished. She appears distressed.  Tearful and anxious but able to cooperate during exam  HENT:  Head: Normocephalic and atraumatic.  Nose: Nose normal.  Mouth/Throat: Uvula is midline, oropharynx is clear and moist and mucous membranes are normal.  Eyes: EOM are normal.  Neck: Normal range of motion. Neck supple.  Cardiovascular: Regular rhythm. Tachycardia present.  Pulmonary/Chest: Effort normal and breath sounds normal. No stridor. No respiratory distress. She has no wheezes. She has no rales.  Abdominal: Soft. She exhibits no distension. There is no tenderness.  Obese abdomen, soft, non-tender  Musculoskeletal: Normal range of motion.  Neurological: She is alert and oriented to person, place, and time.  Skin: Skin is warm and dry. No rash noted. She is not diaphoretic.  Psychiatric: She has a normal mood and affect. Her behavior is normal.  Nursing note and vitals reviewed.    UC Treatments / Results  Labs (all labs ordered are listed, but only abnormal results are displayed) Labs Reviewed - No data to display  EKG  EKG Interpretation None       Radiology No results found.  Procedures Procedures (including critical care time)  Medications Ordered  in UC Medications  ondansetron (ZOFRAN-ODT) disintegrating tablet 4 mg (4 mg Oral Given 02/07/17 1011)  ketorolac (TORADOL) injection 60 mg (60 mg Intramuscular Given 02/07/17 1012)  sodium chloride 0.9 % bolus 1,000 mL (1,000 mLs Intravenous Transfusing/Transfer 02/07/17 1038)     Initial Impression / Assessment and Plan / UC Course  I have reviewed the triage vital signs and the nursing notes.  Pertinent labs & imaging results that were available during my care of the patient were reviewed by me and considered in my medical decision making (see chart for details).     Pt appears anxious, SOB but talking throughout entire exam. Question flu vs viral GI illness, along with panic attack, COPD exacerbation? Doubt ACS as pt denies CP & denies hx of CAD   Able to calm down when asked open ended questions. Pt requesting to go to hospital.  EMS called to take [facility] to [facility] Zofran 4mg  ODT While waiting on EMS, IV started for saline.   Pt tachycardic, temp of 101*F, given Toradol 60mg  IM for fever and pain. Pt cannot have acetaminophen per pt, and medical records states no acetaminophen due to Hashimoto thyroiditis.   EMS took over pt care. Pt in stable condition on O2 4L via nasal canula. O2 gradually improving from 90% to 91-92% at time of discharge from [facility]  Final Clinical Impressions(s) / UC Diagnoses   Final diagnoses:  Nausea vomiting and diarrhea  Shortness of breath  Acute exacerbation of chronic low back pain  Fever in adult    ED Discharge Orders    None       Controlled Substance Prescriptions [place] Controlled Substance Registry consulted? Not Applicable  Lurene Shadowhelps, Maicee Ullman O, PA-C 02/07/17 1049

## 2017-02-07 NOTE — ED Notes (Signed)
O2 increased to 4L. Clemens Catholichristy Elayah Klooster, LPN

## 2017-02-07 NOTE — ED Triage Notes (Signed)
Pt c/o nausea, vomiting, SOB and diarrhea x 0830 today.

## 2017-02-08 DIAGNOSIS — F419 Anxiety disorder, unspecified: Secondary | ICD-10-CM | POA: Diagnosis not present

## 2017-02-08 DIAGNOSIS — J45909 Unspecified asthma, uncomplicated: Secondary | ICD-10-CM | POA: Diagnosis not present

## 2017-02-08 DIAGNOSIS — J189 Pneumonia, unspecified organism: Secondary | ICD-10-CM | POA: Diagnosis not present

## 2017-02-08 DIAGNOSIS — J9601 Acute respiratory failure with hypoxia: Secondary | ICD-10-CM | POA: Diagnosis not present

## 2017-02-08 MED ORDER — TRAZODONE HCL 100 MG PO TABS
100.00 | ORAL_TABLET | ORAL | Status: DC
Start: 2017-02-08 — End: 2017-02-08

## 2017-02-08 MED ORDER — GENERIC EXTERNAL MEDICATION
Status: DC
Start: ? — End: 2017-02-08

## 2017-02-08 MED ORDER — SODIUM CHLORIDE 0.9 % IV SOLN
INTRAVENOUS | Status: DC
Start: ? — End: 2017-02-08

## 2017-02-08 MED ORDER — GENERIC EXTERNAL MEDICATION
2.00 | Status: DC
Start: 2017-02-09 — End: 2017-02-08

## 2017-02-08 MED ORDER — NITROGLYCERIN 0.4 MG SL SUBL
0.40 | SUBLINGUAL_TABLET | SUBLINGUAL | Status: DC
Start: ? — End: 2017-02-08

## 2017-02-08 MED ORDER — ATORVASTATIN CALCIUM 40 MG PO TABS
40.00 | ORAL_TABLET | ORAL | Status: DC
Start: 2017-02-09 — End: 2017-02-08

## 2017-02-08 MED ORDER — LEVOTHYROXINE SODIUM 100 MCG PO TABS
ORAL_TABLET | ORAL | Status: DC
Start: 2017-02-09 — End: 2017-02-08

## 2017-02-08 MED ORDER — IPRATROPIUM-ALBUTEROL 0.5-2.5 (3) MG/3ML IN SOLN
3.00 | RESPIRATORY_TRACT | Status: DC
Start: 2017-02-08 — End: 2017-02-08

## 2017-02-08 MED ORDER — ENOXAPARIN SODIUM 40 MG/0.4ML ~~LOC~~ SOLN
40.00 | SUBCUTANEOUS | Status: DC
Start: 2017-02-09 — End: 2017-02-08

## 2017-02-08 MED ORDER — VANCOMYCIN HCL IN NACL 1.25-0.9 GM/250ML-% IV SOLN
1.25 | INTRAVENOUS | Status: DC
Start: 2017-02-08 — End: 2017-02-08

## 2017-02-08 MED ORDER — BUDESONIDE-FORMOTEROL FUMARATE 80-4.5 MCG/ACT IN AERO
INHALATION_SPRAY | RESPIRATORY_TRACT | Status: DC
Start: 2017-02-08 — End: 2017-02-08

## 2017-02-08 MED ORDER — ALBUTEROL SULFATE (2.5 MG/3ML) 0.083% IN NEBU
2.50 | INHALATION_SOLUTION | RESPIRATORY_TRACT | Status: DC
Start: ? — End: 2017-02-08

## 2017-02-08 MED ORDER — OSELTAMIVIR PHOSPHATE 75 MG PO CAPS
75.00 | ORAL_CAPSULE | ORAL | Status: DC
Start: 2017-02-08 — End: 2017-02-08

## 2017-02-08 MED ORDER — INFLUENZA VAC SUBUNIT QUAD 0.5 ML IM SUSY
.50 | PREFILLED_SYRINGE | INTRAMUSCULAR | Status: DC
Start: ? — End: 2017-02-08

## 2017-02-08 MED ORDER — ONDANSETRON HCL 4 MG/2ML IJ SOLN
4.00 | INTRAMUSCULAR | Status: DC
Start: ? — End: 2017-02-08

## 2017-02-08 MED ORDER — CLONIDINE HCL 0.1 MG PO TABS
.10 | ORAL_TABLET | ORAL | Status: DC
Start: ? — End: 2017-02-08

## 2017-02-08 MED ORDER — DOXYCYCLINE MONOHYDRATE 100 MG PO CAPS
100.00 | ORAL_CAPSULE | ORAL | Status: DC
Start: 2017-02-08 — End: 2017-02-08

## 2017-02-08 MED ORDER — POLYETHYLENE GLYCOL 3350 17 G PO PACK
17.00 | PACK | ORAL | Status: DC
Start: ? — End: 2017-02-08

## 2017-02-08 MED ORDER — DOCUSATE SODIUM 100 MG PO CAPS
100.00 | ORAL_CAPSULE | ORAL | Status: DC
Start: 2017-02-08 — End: 2017-02-08

## 2017-02-09 ENCOUNTER — Other Ambulatory Visit (HOSPITAL_COMMUNITY): Payer: Self-pay | Admitting: Psychiatry

## 2017-02-09 DIAGNOSIS — J9601 Acute respiratory failure with hypoxia: Secondary | ICD-10-CM | POA: Diagnosis not present

## 2017-02-09 DIAGNOSIS — F419 Anxiety disorder, unspecified: Secondary | ICD-10-CM | POA: Diagnosis not present

## 2017-02-09 DIAGNOSIS — J189 Pneumonia, unspecified organism: Secondary | ICD-10-CM | POA: Diagnosis not present

## 2017-02-09 DIAGNOSIS — F332 Major depressive disorder, recurrent severe without psychotic features: Secondary | ICD-10-CM

## 2017-02-09 DIAGNOSIS — J45909 Unspecified asthma, uncomplicated: Secondary | ICD-10-CM | POA: Diagnosis not present

## 2017-02-09 NOTE — Telephone Encounter (Signed)
Pt needs refills on all medications. She refused to make an appt at this time because she just got out of the hospital and don't know when she will be better.   Informed patient that she has not been seen since October and needs an appt. She refused.

## 2017-02-10 MED ORDER — LAMOTRIGINE 200 MG PO TABS: 200.0000 mg | ORAL_TABLET | Freq: Every day | ORAL | 0 refills | Status: DC

## 2017-02-10 MED ORDER — TRAZODONE HCL 100 MG PO TABS: ORAL_TABLET | ORAL | 0 refills | Status: DC

## 2017-02-10 MED ORDER — PAROXETINE HCL 40 MG PO TABS: ORAL_TABLET | ORAL | 0 refills | Status: DC

## 2017-02-10 NOTE — Telephone Encounter (Signed)
Medication management - Telephone call with pt to inform Dr. Gilmore LarocheAkhtar would refill her Lamictal, Paxil, and Trazodone for 30 days only and patient would need to call our office back to schedule a follow up appointment within the next month.  Pt stated understanding and agreed with plan.  Patient confirmed she wanted the new 30 day one time orders to be e-scribed to her [facility] in [place] on [address]and three new 30 day orders e-scribed as approved by Dr. Gilmore LarocheAkhtar for one time 30 day supplies with no refills to patient's [facility].  Patient agreed to call to reschedule first available follow up appointment within the next month.

## 2017-02-10 NOTE — Telephone Encounter (Signed)
Can give last time one month refill of active taking psych meds

## 2017-02-16 ENCOUNTER — Encounter: Payer: Self-pay | Admitting: Sports Medicine

## 2017-02-16 ENCOUNTER — Ambulatory Visit (INDEPENDENT_AMBULATORY_CARE_PROVIDER_SITE_OTHER): Payer: BLUE CROSS/BLUE SHIELD | Admitting: Sports Medicine

## 2017-02-16 VITALS — BP 120/74 | HR 80 | Resp 18 | Wt 210.0 lb

## 2017-02-16 DIAGNOSIS — J4489 Other specified chronic obstructive pulmonary disease: Secondary | ICD-10-CM

## 2017-02-16 DIAGNOSIS — J3089 Other allergic rhinitis: Secondary | ICD-10-CM

## 2017-02-16 DIAGNOSIS — Z23 Encounter for immunization: Secondary | ICD-10-CM

## 2017-02-16 DIAGNOSIS — J449 Chronic obstructive pulmonary disease, unspecified: Secondary | ICD-10-CM

## 2017-02-16 DIAGNOSIS — Z299 Encounter for prophylactic measures, unspecified: Secondary | ICD-10-CM

## 2017-02-16 MED ORDER — FLUTICASONE PROPIONATE 50 MCG/ACT NA SUSP: NASAL | 3 refills | Status: DC

## 2017-02-16 NOTE — Assessment & Plan Note (Signed)
Recent hospital follow-up for pneumonia. Overall feeling significantly better. Pneumococcal 13, influenza vaccine today. Continue with Breo.

## 2017-02-16 NOTE — Assessment & Plan Note (Signed)
Restarting Flonase, continue Singulair.

## 2017-02-16 NOTE — Progress Notes (Signed)
Subjective:    CC: Hospital follow-up  HPI: This is a 57 year old female with questionable COPD but more likely recurrent bronchitis.  She was seen in the urgent care, transferred to the emergency department for hypoxia, ultimately diagnosed with a pneumonia.  Given antibiotics in patient, symptoms improved considerably and she was discharged with an uneventful course.  She is significantly better here, no more shortness of breath, but considering her chronic pulmonary disease she is due for some vaccines.  She does complain of some vertiginous type symptoms, nasal stuffiness, sinus pressure.  She uses over-the-counter antihistamines, has not used any nasal steroids.  I reviewed the past medical history, family history, social history, surgical history, and allergies today and no changes were needed.  Please see the problem list section below in epic for further details.  Past Medical History: Past Medical History:  Diagnosis Date  . Alcohol abuse   . Asthmatic bronchitis 2013  . CFS (chronic fatigue syndrome)   . Discoid lupus   . Fibromyalgia   . GERD (gastroesophageal reflux disease) 2013  . Hyperlipidemia   . Postartificial menopausal syndrome   . Sleep apnea   . Thyroid disease    Past Surgical History: Past Surgical History:  Procedure Laterality Date  . ABDOMINAL HYSTERECTOMY  09/2007   TAH with bladder sling  . NASAL SEPTUM SURGERY    . orto surgery plate in leg    . TUBAL LIGATION     bilateral  . uvuloplasty     Social History: Social History   Socioeconomic History  . Marital status: Married    Spouse name: None  . Number of children: 4  . Years of education: None  . Highest education level: None  Social Needs  . Financial resource strain: None  . Food insecurity - worry: None  . Food insecurity - inability: None  . Transportation needs - medical: None  . Transportation needs - non-medical: None  Occupational History  . Occupation: homemaker   Employer: UNEMPLOYED  Tobacco Use  . Smoking status: Former Smoker    Packs/day: 0.30    Years: 36.00    Pack years: 10.80    Types: Cigarettes  . Smokeless tobacco: Never Used  . Tobacco comment: Started smoking at age 57.  Substance and Sexual Activity  . Alcohol use: No    Comment: quit 01-2006/prior alcoholic-in AA  . Drug use: No  . Sexual activity: No  Other Topics Concern  . None  Social History Narrative  . None   Family History: Family History  Problem Relation Age of Onset  . Hypertension Father   . Alcohol abuse Father   . Heart disease Sister 45       heart attack  . Heart attack Sister   . Depression Brother   . Alcohol abuse Brother        In remission  . Cancer Mother [id number]       colon  . Depression Brother   . Alcohol abuse Maternal Aunt   . Alcohol abuse Paternal Uncle        in remission  . Bipolar disorder Daughter [id number]       Suicide attempt   Allergies: Allergies  Allergen Reactions  . Acetaminophen Other (See Comments)    Pt states she is unable to take this bc of Hashimoto Thyroiditis  . Prednisone     Hallucinations October 2014  . Hydrocodone-Acetaminophen Other (See Comments)    agitation  . Oxycodone Itching and Other (See  Comments)    Hallucinating, sweating, itching Pt states she can take this in small doses   Medications: See med rec.  Review of Systems: No fevers, chills, night sweats, weight loss, chest pain, or shortness of breath.   Objective:    General: Well Developed, well nourished, and in no acute distress.  Neuro: Alert and oriented x3, extra-ocular muscles intact, sensation grossly intact.  HEENT: Normocephalic, atraumatic, pupils equal round reactive to light, neck supple, no masses, no lymphadenopathy, thyroid nonpalpable.  Skin: Warm and dry, no rashes. Cardiac: Regular rate and rhythm, no murmurs rubs or gallops, no lower extremity edema.  Respiratory: Clear to auscultation bilaterally. Not using accessory muscles,  speaking in full sentences.  Impression and Recommendations:    Obstructive chronic bronchitis without exacerbation Recent hospital follow-up for pneumonia. Overall feeling significantly better. Pneumococcal 13, influenza vaccine today. Continue with Breo.  Perennial allergic rhinitis Restarting Flonase, continue Singulair.  Preventive measure Due for mammogram.  I spent 25 minutes with this patient, greater than 50% was face-to-face time counseling regarding the above diagnoses ___________________________________________ Ihor Austin. Benjamin Stain, M.D., ABFM., CAQSM. Primary Care and Sports Medicine [facility]  Adjunct Instructor of Family Medicine  [employer]

## 2017-02-16 NOTE — Assessment & Plan Note (Signed)
Due for mammogram.

## 2017-02-24 ENCOUNTER — Encounter: Payer: Self-pay | Admitting: Sports Medicine

## 2017-02-24 ENCOUNTER — Ambulatory Visit: Payer: BLUE CROSS/BLUE SHIELD | Admitting: Sports Medicine

## 2017-02-24 DIAGNOSIS — M5440 Lumbago with sciatica, unspecified side: Secondary | ICD-10-CM | POA: Diagnosis not present

## 2017-02-24 DIAGNOSIS — M4807 Spinal stenosis, lumbosacral region: Secondary | ICD-10-CM | POA: Diagnosis not present

## 2017-02-24 DIAGNOSIS — G8929 Other chronic pain: Secondary | ICD-10-CM

## 2017-02-24 MED ORDER — DEXAMETHASONE 4 MG PO TABS
4.0000 mg | ORAL_TABLET | Freq: Three times a day (TID) | ORAL | 0 refills | Status: DC
Start: 2017-02-24 — End: 2017-08-29

## 2017-02-24 NOTE — Assessment & Plan Note (Signed)
Patient is having axial pain with occasional right-sided radicular symptoms to the back of the thigh but not past. She had a CT scan not too long ago that showed central canal stenosis at L4-L5. At this point we are going to do a burst of Decadron, and I would like an MRI of her lumbar spine. Adding some home rehab exercises, return to go over MRI results and for epidural planning. SI joint injections at the pain clinic have not really provided much relief.

## 2017-02-24 NOTE — Progress Notes (Signed)
Subjective:    CC: Low back pain  HPI: Jennifer [name] is a pleasant 57 year old female, she comes in with a long history of chronic low back pain, she is been [place] by [place] in [place] [facility], she is been getting sacroiliac joint injections, tells me she has not had any relief, not even temporary from the sacroiliac joint injections.  Pain is worse with sitting, flexion.  Occasional radiation into the right buttock but not past the knee.  No bowel or bladder dysfunction, saddle numbness, no constitutional symptoms.  I reviewed the past medical history, family history, social history, surgical history, and allergies today and no changes were needed.  Please see the problem list section below in epic for further details.  Past Medical History: Past Medical History:  Diagnosis Date  . Alcohol abuse   . Asthmatic bronchitis 2013  . CFS (chronic fatigue syndrome)   . Discoid lupus   . Fibromyalgia   . GERD (gastroesophageal reflux disease) 2013  . Hyperlipidemia   . Postartificial menopausal syndrome   . Sleep apnea   . Thyroid disease    Past Surgical History: Past Surgical History:  Procedure Laterality Date  . ABDOMINAL HYSTERECTOMY  09/2007   TAH with bladder sling  . NASAL SEPTUM SURGERY    . orto surgery plate in leg    . TUBAL LIGATION     bilateral  . uvuloplasty     Social History: Social History   Socioeconomic History  . Marital status: Married    Spouse name: None  . Number of children: 4  . Years of education: None  . Highest education level: None  Social Needs  . Financial resource strain: None  . Food insecurity - worry: None  . Food insecurity - inability: None  . Transportation needs - medical: None  . Transportation needs - non-medical: None  Occupational History  . Occupation: homemaker    Employer: UNEMPLOYED  Tobacco Use  . Smoking status: Former Smoker    Packs/day: 0.30    Years: 36.00    Pack years: 10.80    Types:  Cigarettes  . Smokeless tobacco: Never Used  . Tobacco comment: Started smoking at age 57.  Substance and Sexual Activity  . Alcohol use: No    Comment: quit 01-2006/prior alcoholic-in AA  . Drug use: No  . Sexual activity: No  Other Topics Concern  . None  Social History Narrative  . None   Family History: Family History  Problem Relation Age of Onset  . Hypertension Father   . Alcohol abuse Father   . Heart disease Sister 45       heart attack  . Heart attack Sister   . Depression Brother   . Alcohol abuse Brother        In remission  . Cancer Mother [id number]       colon  . Depression Brother   . Alcohol abuse Maternal Aunt   . Alcohol abuse Paternal Uncle        in remission  . Bipolar disorder Daughter [id number]       Suicide attempt   Allergies: Allergies  Allergen Reactions  . Acetaminophen Other (See Comments)    Pt states she is unable to take this bc of Hashimoto Thyroiditis  . Prednisone     Hallucinations October 2014  . Hydrocodone-Acetaminophen Other (See Comments)    agitation  . Oxycodone Itching and Other (See Comments)    Hallucinating, sweating, itching Pt states  she can take this in small doses   Medications: See med rec.  Review of Systems: No fevers, chills, night sweats, weight loss, chest pain, or shortness of breath.   Objective:    General: Well Developed, well nourished, and in no acute distress.  Neuro: Alert and oriented x3, extra-ocular muscles intact, sensation grossly intact.  HEENT: Normocephalic, atraumatic, pupils equal round reactive to light, neck supple, no masses, no lymphadenopathy, thyroid nonpalpable.  Skin: Warm and dry, no rashes. Cardiac: Regular rate and rhythm, no murmurs rubs or gallops, no lower extremity edema.  Respiratory: Clear to auscultation bilaterally. Not using accessory muscles, speaking in full sentences. Back Exam:  Inspection: Unremarkable  Motion: Flexion 45 deg, Extension 45 deg, Side Bending to 45 deg  bilaterally,  Rotation to 45 deg bilaterally  SLR laying: Negative  XSLR laying: Negative  Palpable tenderness: None. FABER: negative. Sensory change: Gross sensation intact to all lumbar and sacral dermatomes.  Reflexes: 2+ at both patellar tendons, 2+ at achilles tendons, Babinski's downgoing.  Strength at foot  Plantar-flexion: 5/5 Dorsi-flexion: 5/5 Eversion: 5/5 Inversion: 5/5  Leg strength  Quad: 5/5 Hamstring: 5/5 Hip flexor: 5/5 Hip abductors: 5/5  Gait unremarkable.  I reviewed a CT scan from years ago, she has L4-L5 central canal stenosis, multifactorial from degenerative disc disease, facet arthritis and ligamentum flavum hypertrophy.  Impression and Recommendations:    Low back pain Patient is having axial pain with occasional right-sided radicular symptoms to the back of the thigh but not past. She had a CT scan not too long ago that showed central canal stenosis at L4-L5. At this point we are going to do a burst of Decadron, and I would like an MRI of her lumbar spine. Adding some home rehab exercises, return to go over MRI results and for epidural planning. SI joint injections at the pain clinic have not really provided much relief. ___________________________________________ Ihor Austin. Benjamin Stain, M.D., ABFM., CAQSM. Primary Care and Sports Medicine [facility]  Adjunct Instructor of Family Medicine  [employer]

## 2017-02-27 ENCOUNTER — Other Ambulatory Visit: Payer: Self-pay

## 2017-03-02 ENCOUNTER — Encounter (HOSPITAL_COMMUNITY): Payer: Self-pay | Admitting: Psychiatry

## 2017-03-02 ENCOUNTER — Ambulatory Visit (HOSPITAL_COMMUNITY): Payer: BLUE CROSS/BLUE SHIELD | Admitting: Psychiatry

## 2017-03-02 DIAGNOSIS — G4700 Insomnia, unspecified: Secondary | ICD-10-CM

## 2017-03-02 DIAGNOSIS — F332 Major depressive disorder, recurrent severe without psychotic features: Secondary | ICD-10-CM | POA: Diagnosis not present

## 2017-03-02 DIAGNOSIS — F1021 Alcohol dependence, in remission: Secondary | ICD-10-CM | POA: Diagnosis not present

## 2017-03-02 DIAGNOSIS — M797 Fibromyalgia: Secondary | ICD-10-CM | POA: Diagnosis not present

## 2017-03-02 DIAGNOSIS — F411 Generalized anxiety disorder: Secondary | ICD-10-CM

## 2017-03-02 DIAGNOSIS — M791 Myalgia, unspecified site: Secondary | ICD-10-CM

## 2017-03-02 MED ORDER — TRAZODONE HCL 100 MG PO TABS: ORAL_TABLET | ORAL | 0 refills | Status: DC

## 2017-03-02 MED ORDER — LAMOTRIGINE 200 MG PO TABS: 200.0000 mg | ORAL_TABLET | Freq: Every day | ORAL | 0 refills | Status: DC

## 2017-03-02 MED ORDER — PAROXETINE HCL 40 MG PO TABS: ORAL_TABLET | ORAL | 0 refills | Status: DC

## 2017-03-02 NOTE — Progress Notes (Signed)
Patient ID: Jennifer [name], female   DOB: 1960/05/14, 57 y.o.   MRN: [id number]   [facility] Follow-up Outpatient Visit  MARKIE HEFFERNAN [birth year]  Date: 03/02/2017  HPI :  Ms. Ronning is a 57 year old female with a diagnosis of major Depressive Disorder. 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   The patient returns for psychiatric services and  medication management.   Doing fair but this year had pnumonia got admitted. Still feeling weak  Mood gets upset at times and pain effects mood  daughter is supportive  No rash reported   . Severity: Depression: 7/10 Anxiety- baseline  Modifying factor: supportive husband    Review of Systems  Constitutional: Negative for fever.  Cardiovascular: Negative for chest pain.  Gastrointestinal: Negative for nausea.  Musculoskeletal: Positive for myalgias.  Skin: Negative for itching.  Neurological: Negative for tremors and headaches.  Psychiatric/Behavioral: Negative for substance abuse and suicidal ideas.   Weight 206 lbs. Height 5\' 5"  pulse 90.   Physical Exam  Vitals reviewed.  Constitutional: She appears well-developed and well-nourished. No distress.  Skin: She is not diaphoretic.     Past Medical History: Reviewed.  Past Medical History   Diagnosis  Date   .  Discoid lupus    .  Thyroid disease    .  Perimenopausal    .  Fibromyalgia    .  CFS (chronic fatigue syndrome)    .  Sleep apnea      Allergies: Reviewed.  Allergies   Allergen  Reactions   .  Acetaminophen      Pt states she is unable to take this bc of Hashimoto Thyroid   .  Hydrocodone-Acetaminophen      REACTION: agitation   .  Oxycodone      Hallucinating, sweating, itching    Current Medications: Reviewed.  Current Outpatient Medications on File Prior to Visit  Medication Sig Dispense Refill  . atorvastatin (LIPITOR) 40 MG tablet TAKE 1 TABLET DAILY 90 tablet 1  . dexamethasone (DECADRON) 4 MG tablet Take 1 tablet  (4 mg total) by mouth 3 (three) times daily. 15 tablet 0  . fluticasone (FLONASE) 50 MCG/ACT nasal spray One spray in each nostril twice a day, use left hand for right nostril, and right hand for left nostril. 48 g 3  . fluticasone furoate-vilanterol (BREO ELLIPTA) 100-25 MCG/INH AEPB Inhale 1 puff into the lungs daily. 90 each 3  . gabapentin (NEURONTIN) 800 MG tablet TAKE 2 TABLETS(1600 MG) BY MOUTH TWICE DAILY 120 tablet 0  . ipratropium-albuterol (DUONEB) 0.5-2.5 (3) MG/3ML SOLN Take 3 mLs every 6 (six) hours as needed by nebulization. 360 mL 3  . levothyroxine (SYNTHROID, LEVOTHROID) 137 MCG tablet TAKE 1 TABLET(137 MCG) BY MOUTH DAILY 30 tablet 0  . mirabegron ER (MYRBETRIQ) 50 MG TB24 tablet Take 1 tablet (50 mg total) daily by mouth. 90 tablet 3  . montelukast (SINGULAIR) 10 MG tablet Take 1 tablet (10 mg total) by mouth at bedtime. 90 tablet 3  . tapentadol (NUCYNTA) 50 MG tablet Take by mouth.    Marland Kitchen tiZANidine (ZANAFLEX) 4 MG tablet     . [DISCONTINUED] mometasone (NASONEX) 50 MCG/ACT nasal spray Place 2 sprays into the nose daily. 17 g 12  . [DISCONTINUED] omeprazole (PRILOSEC) 20 MG capsule     . [DISCONTINUED] oxybutynin (DITROPAN-XL) 5 MG 24 hr tablet Take 1 tablet (5 mg total) by mouth daily. 30 tablet 3  . [DISCONTINUED] sucralfate (CARAFATE) 1 G  tablet Take 2 tablets (2 g total) by mouth 2 (two) times daily before a meal. preferbly an hour before eating 120 tablet 6   No current facility-administered medications on file prior to visit.        Family History: Reviewed.  Family History   Problem  Relation  Age of Onset   .  Hypertension  Father    .  Heart disease  Sister  45      heart attack    .  Depression  Brother    .  Cancer  Mother  437      colon    Psychiatric Specialty Exam: General Appearance: Fairly Groomed  Patent attorneyye Contact::  Good  Speech:  Clear and Coherent and Normal Rate  Volume:  Normal  Mood: subdued not hopeless  Affect: congruent  Thought  Process:  Loose  Orientation:  Full (Time, Place, and Person)  Thought Content:  WDL  Suicidal Thoughts:  No  Homicidal Thoughts:  No  Memory:  Immediate;   Good Recent;   Good Remote;   Good  Judgement:  Good  Insight:  Fair  Psychomotor Activity:  Normal  Concentration:  Fair  Recall:  Poor  Akathisia:  No  Handed:  Right  Fund of knowledge-average to above average  Language-Intact  AIMS (if indicated):     Assets:  Manufacturing systems engineerCommunication Skills Desire for Improvement Financial Resources/Insurance Housing Intimacy Leisure Time Physical Health Resilience Social Support Talents/Skills Transportation Vocational/Educational  Language intact   Fund of knowledge is good      Assessment:  Major Depression, Recurrent severe-stable Alcohol Dependence in full sustained remission-stable AXIS I  Major Depression, Recurrent severe, Alcohol Dependence in full sustained remission . Fibromyalgia.     Treatment Plan/Recommendations:   Major depression; doing fair. Continue lamictal. paxil Anxiety disorder; at times more. Continue paxil Insomnia; ongoing without med. Reviewed sleep hgyiene. Continue trazadone  Fibromyalgia; baseline, on paxil on neurontin  Provided supportive therapy. Reviewed meds and concerns Fu 3 months. [facility] Thresa RossNadeem Faruq Rosenberger, MD

## 2017-03-06 ENCOUNTER — Ambulatory Visit (INDEPENDENT_AMBULATORY_CARE_PROVIDER_SITE_OTHER): Payer: BLUE CROSS/BLUE SHIELD

## 2017-03-06 DIAGNOSIS — M5440 Lumbago with sciatica, unspecified side: Principal | ICD-10-CM

## 2017-03-06 DIAGNOSIS — M545 Low back pain: Secondary | ICD-10-CM | POA: Diagnosis not present

## 2017-03-06 DIAGNOSIS — M48061 Spinal stenosis, lumbar region without neurogenic claudication: Secondary | ICD-10-CM | POA: Diagnosis not present

## 2017-03-06 DIAGNOSIS — M5116 Intervertebral disc disorders with radiculopathy, lumbar region: Secondary | ICD-10-CM | POA: Diagnosis not present

## 2017-03-06 DIAGNOSIS — M4807 Spinal stenosis, lumbosacral region: Secondary | ICD-10-CM

## 2017-03-06 DIAGNOSIS — G8929 Other chronic pain: Secondary | ICD-10-CM

## 2017-03-09 ENCOUNTER — Other Ambulatory Visit (HOSPITAL_COMMUNITY): Payer: Self-pay | Admitting: Psychiatry

## 2017-03-09 DIAGNOSIS — F332 Major depressive disorder, recurrent severe without psychotic features: Secondary | ICD-10-CM

## 2017-03-20 ENCOUNTER — Telehealth: Payer: Self-pay | Admitting: Sports Medicine

## 2017-03-20 ENCOUNTER — Ambulatory Visit: Payer: Self-pay | Admitting: Sports Medicine

## 2017-03-20 NOTE — Telephone Encounter (Signed)
Spoke with pt states she feels really fatigued and weak and couldn't make it to her appointment. States she's had epidural before but after having the MRI would like to know where you would suggest having the next injection done. Pt states she's also feeling "cruddy" in the chest and would like to know if there's something you could do for her.

## 2017-03-20 NOTE — Telephone Encounter (Signed)
Pt is not feeling well and request that Dr.T or his nurse call her about her CT results

## 2017-03-21 NOTE — Telephone Encounter (Signed)
Difficult to know what "cruddy" means, I probably need to actually listen to her chest, it is possible she has a pneumonia, I would recommend that she make an appointment to be evaluated with either myself or 1 of my partners.

## 2017-03-23 ENCOUNTER — Encounter: Payer: Self-pay | Admitting: Sports Medicine

## 2017-03-23 ENCOUNTER — Ambulatory Visit: Payer: BLUE CROSS/BLUE SHIELD | Admitting: Sports Medicine

## 2017-03-23 DIAGNOSIS — M5416 Radiculopathy, lumbar region: Secondary | ICD-10-CM | POA: Diagnosis not present

## 2017-03-23 MED ORDER — DIAZEPAM 5 MG PO TABS: ORAL_TABLET | ORAL | 0 refills | Status: DC

## 2017-03-23 NOTE — Assessment & Plan Note (Signed)
Axial and discogenic back pain with radiation down the right leg in an L5 distribution. She has been getting SI joint injections with her pain doctor, no relief. MRI did show a large L4-L5 rightward protruding disc with contact to the far lateral L4 nerve root and the intraspinal L5 nerve root on the right. She also has a small protruding disc at L5-S1. We are going to proceed with a right L4-L5 interlaminar epidural, Valium for preprocedural anxiolysis. Return to see me 1 month after injection to evaluate response.

## 2017-03-23 NOTE — Progress Notes (Signed)
Subjective:    CC: Follow-up MRI  HPI: This is a pleasant 57 year old female, she has been seen by a pain management clinic out of town, he has been getting sacroiliac joint injections without significant relief.  She came here for second opinion, her pain is localized in the back, right buttock with radiation occasionally down the right leg, right thigh to the outside of the right foot.  Worse with sitting, flexion, Valsalva, no bowel or bladder dysfunction, saddle numbness, no constitutional symptoms.  We obtained an MRI after she failed conservative measures, the results of which will be dictated below.  I reviewed the past medical history, family history, social history, surgical history, and allergies today and no changes were needed.  Please see the problem list section below in epic for further details.  Past Medical History: Past Medical History:  Diagnosis Date  . Alcohol abuse   . Asthmatic bronchitis 2013  . CFS (chronic fatigue syndrome)   . Discoid lupus   . Fibromyalgia   . GERD (gastroesophageal reflux disease) 2013  . Hyperlipidemia   . Postartificial menopausal syndrome   . Sleep apnea   . Thyroid disease    Past Surgical History: Past Surgical History:  Procedure Laterality Date  . ABDOMINAL HYSTERECTOMY  09/2007   TAH with bladder sling  . NASAL SEPTUM SURGERY    . orto surgery plate in leg    . TUBAL LIGATION     bilateral  . uvuloplasty     Social History: Social History   Socioeconomic History  . Marital status: Married    Spouse name: Not on file  . Number of children: 4  . Years of education: Not on file  . Highest education level: Not on file  Occupational History  . Occupation: homemaker    Employer: UNEMPLOYED  Social Needs  . Financial resource strain: Not on file  . Food insecurity:    Worry: Not on file    Inability: Not on file  . Transportation needs:    Medical: Not on file    Non-medical: Not on file  Tobacco Use  . Smoking  status: Former Smoker    Packs/day: 0.30    Years: 36.00    Pack years: 10.80    Types: Cigarettes  . Smokeless tobacco: Never Used  . Tobacco comment: Started smoking at age 26.  Substance and Sexual Activity  . Alcohol use: No    Comment: quit 01-2006/prior alcoholic-in AA  . Drug use: No  . Sexual activity: Never  Lifestyle  . Physical activity:    Days per week: Not on file    Minutes per session: Not on file  . Stress: Not on file  Relationships  . Social connections:    Talks on phone: Not on file    Gets together: Not on file    Attends religious service: Not on file    Active member of club or organization: Not on file    Attends meetings of clubs or organizations: Not on file    Relationship status: Not on file  Other Topics Concern  . Not on file  Social History Narrative  . Not on file   Family History: Family History  Problem Relation Age of Onset  . Hypertension Father   . Alcohol abuse Father   . Heart disease Sister 45       heart attack  . Heart attack Sister   . Depression Brother   . Alcohol abuse Brother  In remission  . Cancer Mother 2037       colon  . Depression Brother   . Alcohol abuse Maternal Aunt   . Alcohol abuse Paternal Uncle        in remission  . Bipolar disorder Daughter [id number]       Suicide attempt   Allergies: Allergies  Allergen Reactions  . Acetaminophen Other (See Comments)    Pt states she is unable to take this bc of Hashimoto Thyroiditis  . Prednisone     Hallucinations October 2014  . Hydrocodone-Acetaminophen Other (See Comments)    agitation  . Oxycodone Itching and Other (See Comments)    Hallucinating, sweating, itching Pt states she can take this in small doses   Medications: See med rec.  Review of Systems: No fevers, chills, night sweats, weight loss, chest pain, or shortness of breath.   Objective:    General: Well Developed, well nourished, and in no acute distress.  Neuro: Alert and oriented x3,  extra-ocular muscles intact, sensation grossly intact.  HEENT: Normocephalic, atraumatic, pupils equal round reactive to light, neck supple, no masses, no lymphadenopathy, thyroid nonpalpable.  Skin: Warm and dry, no rashes. Cardiac: Regular rate and rhythm, no murmurs rubs or gallops, no lower extremity edema.  Respiratory: Clear to auscultation bilaterally. Not using accessory muscles, speaking in full sentences.  MRI shows to moderate extruded discs, the largest of which is broad-based at L4-L5 with a rightward component that does contact the far lateral L4 nerve root on the right as well as the intraspinal L5 nerve root on the right.  There is also smaller L5-S1 disc protrusion that does not appear to cause any central or foraminal stenosis.  Impression and Recommendations:    Right lumbar radiculitis Axial and discogenic back pain with radiation down the right leg in an L5 distribution. She has been getting SI joint injections with her pain doctor, no relief. MRI did show a large L4-L5 rightward protruding disc with contact to the far lateral L4 nerve root and the intraspinal L5 nerve root on the right. She also has a small protruding disc at L5-S1. We are going to proceed with a right L4-L5 interlaminar epidural, Valium for preprocedural anxiolysis. Return to see me 1 month after injection to evaluate response.  I spent 25 minutes with this patient, greater than 50% was face-to-face time counseling regarding the above diagnoses ___________________________________________ Ihor Austinhomas J. Benjamin Stainhekkekandam, M.D., ABFM., CAQSM. Primary Care and Sports Medicine [facility]  Adjunct Instructor of Family Medicine  [employer]

## 2017-04-12 ENCOUNTER — Ambulatory Visit
Admission: RE | Admit: 2017-04-12 | Discharge: 2017-04-12 | Disposition: A | Discharge status: Home / Self Care | Payer: BLUE CROSS/BLUE SHIELD | Source: Ambulatory Visit | Attending: Sports Medicine | Admitting: Sports Medicine

## 2017-04-12 DIAGNOSIS — M5126 Other intervertebral disc displacement, lumbar region: Secondary | ICD-10-CM | POA: Diagnosis not present

## 2017-04-12 MED ORDER — METHYLPREDNISOLONE ACETATE 40 MG/ML INJ SUSP (RADIOLOG
120.0000 mg | Freq: Once | INTRAMUSCULAR | Status: AC
  Administered 2017-04-12: 120 mg via EPIDURAL

## 2017-04-12 MED ORDER — IOPAMIDOL (ISOVUE-M 200) INJECTION 41%
1.0000 mL | Freq: Once | INTRAMUSCULAR | Status: AC
  Administered 2017-04-12: 1 mL via EPIDURAL

## 2017-04-12 NOTE — Discharge Instructions (Signed)

## 2017-04-14 DIAGNOSIS — M549 Dorsalgia, unspecified: Secondary | ICD-10-CM | POA: Diagnosis not present

## 2017-04-14 DIAGNOSIS — M5136 Other intervertebral disc degeneration, lumbar region: Secondary | ICD-10-CM | POA: Diagnosis not present

## 2017-04-14 DIAGNOSIS — M5030 Other cervical disc degeneration, unspecified cervical region: Secondary | ICD-10-CM | POA: Diagnosis not present

## 2017-04-14 DIAGNOSIS — M533 Sacrococcygeal disorders, not elsewhere classified: Secondary | ICD-10-CM | POA: Diagnosis not present

## 2017-04-15 ENCOUNTER — Other Ambulatory Visit (HOSPITAL_COMMUNITY): Payer: Self-pay | Admitting: Psychiatry

## 2017-04-15 DIAGNOSIS — F332 Major depressive disorder, recurrent severe without psychotic features: Secondary | ICD-10-CM

## 2017-04-20 ENCOUNTER — Other Ambulatory Visit: Payer: Self-pay | Admitting: Sports Medicine

## 2017-04-20 DIAGNOSIS — J3089 Other allergic rhinitis: Secondary | ICD-10-CM

## 2017-04-20 DIAGNOSIS — J449 Chronic obstructive pulmonary disease, unspecified: Secondary | ICD-10-CM

## 2017-04-23 ENCOUNTER — Other Ambulatory Visit (HOSPITAL_COMMUNITY): Payer: Self-pay | Admitting: Psychiatry

## 2017-04-23 DIAGNOSIS — F332 Major depressive disorder, recurrent severe without psychotic features: Secondary | ICD-10-CM

## 2017-04-27 ENCOUNTER — Other Ambulatory Visit (HOSPITAL_COMMUNITY): Payer: Self-pay | Admitting: Psychiatry

## 2017-05-02 ENCOUNTER — Other Ambulatory Visit (HOSPITAL_COMMUNITY): Payer: Self-pay | Admitting: Psychiatry

## 2017-05-02 MED ORDER — LAMOTRIGINE 25 MG PO TABS: 25.0000 mg | ORAL_TABLET | Freq: Every day | ORAL | 0 refills | Status: DC

## 2017-05-02 NOTE — Telephone Encounter (Signed)
Have to start lamictal  per day for week first and then increase to 2 of  for the next week.   Crystal can send lamictal  tablets count 30 for now to pharmacy

## 2017-05-02 NOTE — Telephone Encounter (Signed)
[facility] not [facility] her [facility] for lamictal yet. She has been off it for 10 days.  Informed patient that she needs to contact them to see where the med is.   She would like to know if aktar would be willing to fill a [facility] at [facility]. Maybe a 7 day supply to last her until she gets her meds.   Also since she is taking a  dose, will it be ok to just start taking  since she has been off it for 10 days.   CB # [phone]

## 2017-05-02 NOTE — Telephone Encounter (Signed)
Explained to patient that per Dr Gilmore Laroche, she will take one  tab for one week then 2  tablets for the next week. Sent in medication to [facility].

## 2017-05-03 DIAGNOSIS — M5136 Other intervertebral disc degeneration, lumbar region: Secondary | ICD-10-CM | POA: Diagnosis not present

## 2017-05-09 ENCOUNTER — Other Ambulatory Visit (HOSPITAL_COMMUNITY): Payer: Self-pay | Admitting: Psychiatry

## 2017-05-09 DIAGNOSIS — F332 Major depressive disorder, recurrent severe without psychotic features: Secondary | ICD-10-CM

## 2017-05-17 ENCOUNTER — Other Ambulatory Visit (HOSPITAL_COMMUNITY): Payer: Self-pay | Admitting: Psychiatry

## 2017-05-17 DIAGNOSIS — M5136 Other intervertebral disc degeneration, lumbar region: Secondary | ICD-10-CM | POA: Diagnosis not present

## 2017-05-23 ENCOUNTER — Other Ambulatory Visit (HOSPITAL_COMMUNITY): Payer: Self-pay | Admitting: Psychiatry

## 2017-05-23 DIAGNOSIS — F332 Major depressive disorder, recurrent severe without psychotic features: Secondary | ICD-10-CM

## 2017-06-06 ENCOUNTER — Other Ambulatory Visit (HOSPITAL_COMMUNITY): Payer: Self-pay | Admitting: Psychiatry

## 2017-06-06 DIAGNOSIS — F332 Major depressive disorder, recurrent severe without psychotic features: Secondary | ICD-10-CM

## 2017-06-17 ENCOUNTER — Other Ambulatory Visit (HOSPITAL_COMMUNITY): Payer: Self-pay | Admitting: Psychiatry

## 2017-06-20 ENCOUNTER — Other Ambulatory Visit (HOSPITAL_COMMUNITY): Payer: Self-pay

## 2017-06-20 ENCOUNTER — Other Ambulatory Visit (HOSPITAL_COMMUNITY): Payer: Self-pay | Admitting: Psychiatry

## 2017-06-20 DIAGNOSIS — F332 Major depressive disorder, recurrent severe without psychotic features: Secondary | ICD-10-CM

## 2017-06-22 ENCOUNTER — Other Ambulatory Visit (HOSPITAL_COMMUNITY): Payer: Self-pay

## 2017-06-22 DIAGNOSIS — F332 Major depressive disorder, recurrent severe without psychotic features: Secondary | ICD-10-CM

## 2017-06-22 MED ORDER — PAROXETINE HCL 40 MG PO TABS
ORAL_TABLET | ORAL | 0 refills | Status: DC
Start: 2017-06-22 — End: 2017-07-22

## 2017-06-27 ENCOUNTER — Ambulatory Visit: Payer: Self-pay | Admitting: Sports Medicine

## 2017-06-29 ENCOUNTER — Telehealth: Payer: Self-pay | Admitting: *Deleted

## 2017-06-29 NOTE — Telephone Encounter (Signed)
Pt would like to be excused from jury duty due to a bulging disk and fibromyalgia.

## 2017-06-29 NOTE — Telephone Encounter (Signed)
Pt will pick letter up tomorrow.

## 2017-06-29 NOTE — Telephone Encounter (Signed)
Letter in box. 

## 2017-07-12 ENCOUNTER — Other Ambulatory Visit (HOSPITAL_COMMUNITY): Payer: Self-pay | Admitting: Psychiatry

## 2017-07-12 DIAGNOSIS — F332 Major depressive disorder, recurrent severe without psychotic features: Secondary | ICD-10-CM

## 2017-07-20 ENCOUNTER — Other Ambulatory Visit (HOSPITAL_COMMUNITY): Payer: Self-pay | Admitting: Psychiatry

## 2017-07-20 ENCOUNTER — Telehealth (HOSPITAL_COMMUNITY): Payer: Self-pay | Admitting: Psychiatry

## 2017-07-20 DIAGNOSIS — F332 Major depressive disorder, recurrent severe without psychotic features: Secondary | ICD-10-CM

## 2017-07-20 MED ORDER — TRAZODONE HCL 100 MG PO TABS: ORAL_TABLET | ORAL | 0 refills | Status: DC

## 2017-07-20 NOTE — Telephone Encounter (Signed)
Per Dr. Gilmore LarocheAkhtar I sent a one month supply of Trazodone to the pharmacy. Informed patient.

## 2017-07-20 NOTE — Telephone Encounter (Signed)
Can send 

## 2017-07-20 NOTE — Telephone Encounter (Signed)
Pt needs refill on trazodone sent to [facility] [address] . Pt has an apt 8/1

## 2017-07-22 ENCOUNTER — Other Ambulatory Visit (HOSPITAL_COMMUNITY): Payer: Self-pay | Admitting: Psychiatry

## 2017-07-22 DIAGNOSIS — F332 Major depressive disorder, recurrent severe without psychotic features: Secondary | ICD-10-CM

## 2017-08-03 ENCOUNTER — Other Ambulatory Visit: Payer: Self-pay

## 2017-08-03 ENCOUNTER — Encounter (HOSPITAL_COMMUNITY): Payer: Self-pay | Admitting: Psychiatry

## 2017-08-03 ENCOUNTER — Ambulatory Visit (INDEPENDENT_AMBULATORY_CARE_PROVIDER_SITE_OTHER): Payer: 59 | Admitting: Psychiatry

## 2017-08-03 VITALS — BP 110/78 | HR 89 | Ht 65.0 in | Wt 198.0 lb

## 2017-08-03 DIAGNOSIS — M797 Fibromyalgia: Secondary | ICD-10-CM

## 2017-08-03 DIAGNOSIS — Z79899 Other long term (current) drug therapy: Secondary | ICD-10-CM | POA: Diagnosis not present

## 2017-08-03 DIAGNOSIS — F332 Major depressive disorder, recurrent severe without psychotic features: Secondary | ICD-10-CM

## 2017-08-03 DIAGNOSIS — F411 Generalized anxiety disorder: Secondary | ICD-10-CM | POA: Diagnosis not present

## 2017-08-03 MED ORDER — TRAZODONE HCL 100 MG PO TABS: ORAL_TABLET | ORAL | 3 refills | Status: DC

## 2017-08-03 MED ORDER — PAROXETINE HCL 40 MG PO TABS: 40.0000 mg | ORAL_TABLET | Freq: Every morning | ORAL | 3 refills | Status: DC

## 2017-08-03 MED ORDER — LAMOTRIGINE 200 MG PO TABS: 200.0000 mg | ORAL_TABLET | Freq: Every day | ORAL | 3 refills | Status: DC

## 2017-08-03 NOTE — Progress Notes (Signed)
Patient ID: NIELLE DUFORD, female   DOB: 11/17/1960, 57 y.o.   MRN: [id number]   [facility] Follow-up Outpatient Visit  RAKIYAH ESCH 30-Oct-1960  Date: 08/03/2017  HPI :  Ms. Birmingham is a 57 year old female with a diagnosis of major Depressive Disorder. 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   The patient returns for psychiatric services and  medication management.   Pain and fibro effects mood. Has tingling in legs and posture can effect with poor sleep Has appointment with spinal surgeon Despite all this feels depression meds do help with dysphoria. No side effects or tremors Takes trazadone at night , cautioned not to increase dose to avoid high serotonin with paxil   daughter is supportive   . Severity: Depression: 7/10 Anxiety- fluctuates  Modifying factor: supportive husband    Review of Systems  Constitutional: Negative for fever.  Cardiovascular: Negative for chest pain.  Gastrointestinal: Negative for nausea.  Musculoskeletal: Positive for myalgias.  Skin: Negative for itching.  Neurological: Negative for tremors and headaches.  Psychiatric/Behavioral: Negative for substance abuse and suicidal ideas.   Weight 206 lbs. Height 5\' 5"  pulse 90.   Physical Exam  Vitals reviewed.  Constitutional: She appears well-developed and well-nourished. No distress.  Skin: She is not diaphoretic.     Past Medical History: Reviewed.  Past Medical History   Diagnosis  Date   .  Discoid lupus    .  Thyroid disease    .  Perimenopausal    .  Fibromyalgia    .  CFS (chronic fatigue syndrome)    .  Sleep apnea      Allergies: Reviewed.  Allergies   Allergen  Reactions   .  Acetaminophen      Pt states she is unable to take this bc of Hashimoto Thyroid   .  Hydrocodone-Acetaminophen      REACTION: agitation   .  Oxycodone      Hallucinating, sweating, itching    Current Medications: Reviewed.  Current Outpatient Medications on File  Prior to Visit  Medication Sig Dispense Refill  . atorvastatin (LIPITOR) 40 MG tablet TAKE 1 TABLET DAILY 90 tablet 1  . BREO ELLIPTA 100-25 MCG/INH AEPB USE 1 INHALATION DAILY 180 each 3  . dexamethasone (DECADRON) 4 MG tablet Take 1 tablet (4 mg total) by mouth 3 (three) times daily. 15 tablet 0  . fluticasone (FLONASE) 50 MCG/ACT nasal spray One spray in each nostril twice a day, use left hand for right nostril, and right hand for left nostril. 48 g 3  . gabapentin (NEURONTIN) 800 MG tablet TAKE 2 TABLETS(1600 MG) BY MOUTH TWICE DAILY 120 tablet 0  . ipratropium-albuterol (DUONEB) 0.5-2.5 (3) MG/3ML SOLN Take 3 mLs every 6 (six) hours as needed by nebulization. 360 mL 3  . levothyroxine (SYNTHROID, LEVOTHROID) 137 MCG tablet TAKE 1 TABLET(137 MCG) BY MOUTH DAILY 30 tablet 0  . mirabegron ER (MYRBETRIQ) 50 MG TB24 tablet Take 1 tablet (50 mg total) daily by mouth. 90 tablet 3  . montelukast (SINGULAIR) 10 MG tablet TAKE 1 TABLET AT BEDTIME 90 tablet 3  . MYRBETRIQ 50 MG TB24 tablet TAKE 1 TABLET DAILY 90 tablet 3  . tapentadol (NUCYNTA) 50 MG tablet Take by mouth.    Marland Kitchen tiZANidine (ZANAFLEX) 4 MG tablet     . [DISCONTINUED] mometasone (NASONEX) 50 MCG/ACT nasal spray Place 2 sprays into the nose daily. 17 g 12  . [DISCONTINUED] omeprazole (PRILOSEC) 20 MG capsule     . [  DISCONTINUED] oxybutynin (DITROPAN-XL) 5 MG 24 hr tablet Take 1 tablet (5 mg total) by mouth daily. 30 tablet 3  . [DISCONTINUED] sucralfate (CARAFATE) 1 G tablet Take 2 tablets (2 g total) by mouth 2 (two) times daily before a meal. preferbly an hour before eating 120 tablet 6   No current facility-administered medications on file prior to visit.        Family History: Reviewed.  Family History   Problem  Relation  Age of Onset   .  Hypertension  Father    .  Heart disease  Sister  45      heart attack    .  Depression  Brother    .  Cancer  Mother  [id number]      colon    Psychiatric Specialty Exam: General  Appearance: Fairly Groomed  Patent attorneyye Contact::  Good  Speech:  Clear and Coherent and Normal Rate  Volume:  Normal  Mood: somewhat subdued  Affect: congruent  Thought Process:  Loose  Orientation:  Full (Time, Place, and Person)  Thought Content:  WDL  Suicidal Thoughts:  No  Homicidal Thoughts:  No  Memory:  Immediate;   Good Recent;   Good Remote;   Good  Judgement:  Good  Insight:  Fair  Psychomotor Activity:  Normal  Concentration:  Fair  Recall:  Poor  Akathisia:  No  Handed:  Right  Fund of knowledge-average to above average  Language-Intact  AIMS (if indicated):     Assets:  Manufacturing systems engineerCommunication Skills Desire for Improvement Financial Resources/Insurance Housing Intimacy Leisure Time Physical Health Resilience Social Support Talents/Skills Transportation Vocational/Educational  Language intact   Fund of knowledge is good      Assessment:  Major Depression, Recurrent severe-stable Alcohol Dependence in full sustained remission-stable AXIS I  Major Depression, Recurrent severe, Alcohol Dependence in full sustained remission . Fibromyalgia.     Treatment Plan/Recommendations:   Major depression; fair. Continue paxil and lamictal Anxiety disorder; at times more. Continue paxil Insomnia; has to take trazadone. Reviewed sleep hygiene Fibromyalgia; baseline, on paxil on neurontin  Provided supportive therapy. Reviewed meds and concerns Fu 3 months. [facility] Thresa RossNadeem Valleri Hendricksen, MD

## 2017-08-08 ENCOUNTER — Other Ambulatory Visit (HOSPITAL_COMMUNITY): Payer: Self-pay | Admitting: Psychiatry

## 2017-08-08 DIAGNOSIS — F332 Major depressive disorder, recurrent severe without psychotic features: Secondary | ICD-10-CM

## 2017-08-24 ENCOUNTER — Other Ambulatory Visit (HOSPITAL_COMMUNITY): Payer: Self-pay | Admitting: Psychiatry

## 2017-08-24 DIAGNOSIS — F332 Major depressive disorder, recurrent severe without psychotic features: Secondary | ICD-10-CM

## 2017-08-29 ENCOUNTER — Ambulatory Visit: Payer: 59 | Admitting: Sports Medicine

## 2017-08-29 ENCOUNTER — Encounter: Payer: Self-pay | Admitting: Sports Medicine

## 2017-08-29 VITALS — BP 125/73 | HR 95 | Ht 65.0 in | Wt 197.0 lb

## 2017-08-29 DIAGNOSIS — J411 Mucopurulent chronic bronchitis: Secondary | ICD-10-CM

## 2017-08-29 DIAGNOSIS — E039 Hypothyroidism, unspecified: Secondary | ICD-10-CM

## 2017-08-29 DIAGNOSIS — Z23 Encounter for immunization: Secondary | ICD-10-CM

## 2017-08-29 DIAGNOSIS — J4489 Other specified chronic obstructive pulmonary disease: Secondary | ICD-10-CM

## 2017-08-29 DIAGNOSIS — J449 Chronic obstructive pulmonary disease, unspecified: Secondary | ICD-10-CM

## 2017-08-29 DIAGNOSIS — N8110 Cystocele, unspecified: Secondary | ICD-10-CM

## 2017-08-29 DIAGNOSIS — Z299 Encounter for prophylactic measures, unspecified: Secondary | ICD-10-CM

## 2017-08-29 LAB — PULMONARY FUNCTION TEST

## 2017-08-29 MED ORDER — ALBUTEROL SULFATE (2.5 MG/3ML) 0.083% IN NEBU
2.5000 mg | INHALATION_SOLUTION | Freq: Once | RESPIRATORY_TRACT | Status: AC
  Administered 2017-08-29: 2.5 mg via RESPIRATORY_TRACT

## 2017-08-29 MED ORDER — MIRABEGRON ER 50 MG PO TB24: 50.0000 mg | ORAL_TABLET | Freq: Every day | ORAL | 3 refills | Status: DC

## 2017-08-29 MED ORDER — LEVOTHYROXINE SODIUM 150 MCG PO TABS: 150.0000 ug | ORAL_TABLET | Freq: Every day | ORAL | 3 refills | Status: DC

## 2017-08-29 NOTE — Assessment & Plan Note (Signed)
Normal pre-and postbronchodilator spirometry, we have had a tough time convincing her that she does not have COPD, simply recurrent bronchitis. She is having back surgery coming up, she has greater than 4 metabolic equivalents of exercise capacity and is low risk for intermediate risk noncardiac surgery.

## 2017-08-29 NOTE — Assessment & Plan Note (Signed)
Checking other routine labs

## 2017-08-29 NOTE — Assessment & Plan Note (Signed)
Refilling Myrbetriq 

## 2017-08-29 NOTE — Progress Notes (Signed)
Subjective:    CC: Several issues  HPI: Recurrent bronchitis: Has already had spirometry in 2013 that was normal, she made her own spirometry appointment, she was afraid of what her lung function was like, has not really had many episodes of bronchitis and symptoms are well controlled on Breo with occasional albuterol.  Hypothyroidism: Needs a refill on levothyroxine, currently taking 150 mcg.  Overactive bladder: Needs a refill on Myrbetriq, well controlled.  Preventive measures: Needs routine labs ordered.  Surgical clearance: Can walk up 2 flights of steps and 2 city blocks without any chest pain, shortness of breath.  She is scheduled for L4-L5 laminectomy.  I reviewed the past medical history, family history, social history, surgical history, and allergies today and no changes were needed.  Please see the problem list section below in epic for further details.  Past Medical History: Past Medical History:  Diagnosis Date  . Alcohol abuse   . Asthmatic bronchitis 2013  . CFS (chronic fatigue syndrome)   . Discoid lupus   . Fibromyalgia   . GERD (gastroesophageal reflux disease) 2013  . Hyperlipidemia   . Postartificial menopausal syndrome   . Sleep apnea   . Thyroid disease    Past Surgical History: Past Surgical History:  Procedure Laterality Date  . ABDOMINAL HYSTERECTOMY  09/2007   TAH with bladder sling  . NASAL SEPTUM SURGERY    . orto surgery plate in leg    . TUBAL LIGATION     bilateral  . uvuloplasty     Social History: Social History   Socioeconomic History  . Marital status: Married    Spouse name: Not on file  . Number of children: 4  . Years of education: Not on file  . Highest education level: Not on file  Occupational History  . Occupation: homemaker    Employer: UNEMPLOYED  Social Needs  . Financial resource strain: Not on file  . Food insecurity:    Worry: Not on file    Inability: Not on file  . Transportation needs:    Medical: Not on  file    Non-medical: Not on file  Tobacco Use  . Smoking status: Former Smoker    Packs/day: 0.30    Years: 36.00    Pack years: 10.80    Types: Cigarettes  . Smokeless tobacco: Never Used  . Tobacco comment: Started smoking at age 52.  Substance and Sexual Activity  . Alcohol use: No    Comment: quit 01-2006/prior alcoholic-in AA  . Drug use: No  . Sexual activity: Never  Lifestyle  . Physical activity:    Days per week: Not on file    Minutes per session: Not on file  . Stress: Not on file  Relationships  . Social connections:    Talks on phone: Not on file    Gets together: Not on file    Attends religious service: Not on file    Active member of club or organization: Not on file    Attends meetings of clubs or organizations: Not on file    Relationship status: Not on file  Other Topics Concern  . Not on file  Social History Narrative  . Not on file   Family History: Family History  Problem Relation Age of Onset  . Hypertension Father   . Alcohol abuse Father   . Heart disease Sister 45       heart attack  . Heart attack Sister   . Depression Brother   .  Alcohol abuse Brother        In remission  . Cancer Mother 237       colon  . Depression Brother   . Alcohol abuse Maternal Aunt   . Alcohol abuse Paternal Uncle        in remission  . Bipolar disorder Daughter 2021       Suicide attempt   Allergies: Allergies  Allergen Reactions  . Acetaminophen Other (See Comments)    Pt states she is unable to take this bc of Hashimoto Thyroiditis  . Prednisone     Hallucinations October 2014  . Hydrocodone-Acetaminophen Other (See Comments)    agitation  . Oxycodone Itching and Other (See Comments)    Hallucinating, sweating, itching Pt states she can take this in small doses   Medications: See med rec.  Review of Systems: No fevers, chills, night sweats, weight loss, chest pain, or shortness of breath.   Objective:    General: Well Developed, well  nourished, and in no acute distress.  Neuro: Alert and oriented x3, extra-ocular muscles intact, sensation grossly intact.  HEENT: Normocephalic, atraumatic, pupils equal round reactive to light, neck supple, no masses, no lymphadenopathy, thyroid nonpalpable.  Skin: Warm and dry, no rashes. Cardiac: Regular rate and rhythm, no murmurs rubs or gallops, no lower extremity edema.  Respiratory: Clear to auscultation bilaterally. Not using accessory muscles, speaking in full sentences.  Pre-and postbronchodilator spirometry is completely normal.  Impression and Recommendations:    Bronchitis, mucopurulent recurrent (HCC) Normal pre-and postbronchodilator spirometry, we have had a tough time convincing her that she does not have COPD, simply recurrent bronchitis. She is having back surgery coming up, she has greater than 4 metabolic equivalents of exercise capacity and is low risk for intermediate risk noncardiac surgery.   Hypothyroidism There has been some confusion on levothyroxine dosing, she has been taking 150 mcg. Rechecking TSH today.  Preventive measure Checking other routine labs  Female bladder prolapse with associated stress incontinence Refilling Myrbetriq.  I spent 25 minutes with this patient, greater than 50% was face-to-face time counseling regarding the above diagnoses, this was separate from the time spent performing the pre-and postbronchodilator prolonged spirometry. ___________________________________________ Ihor Austinhomas J. Benjamin Stainhekkekandam, M.D., ABFM., CAQSM. Primary Care and Sports Medicine [facility]  Adjunct Instructor of Family Medicine  [employer]

## 2017-08-29 NOTE — Assessment & Plan Note (Signed)
There has been some confusion on levothyroxine dosing, she has been taking 150 mcg. Rechecking TSH today.

## 2017-08-31 ENCOUNTER — Telehealth: Payer: Self-pay | Admitting: Sports Medicine

## 2017-08-31 DIAGNOSIS — N8110 Cystocele, unspecified: Secondary | ICD-10-CM

## 2017-08-31 MED ORDER — OXYBUTYNIN CHLORIDE ER 5 MG PO TB24: 5.0000 mg | ORAL_TABLET | Freq: Every day | ORAL | 3 refills | Status: DC

## 2017-08-31 NOTE — Telephone Encounter (Signed)
[facility] that Myrbetriq is a plan excluision and an appeal is not available at per insurance. Patient will have to try another medication. Please advise.

## 2017-08-31 NOTE — Telephone Encounter (Signed)
Switching to oxybutynin 

## 2017-09-06 ENCOUNTER — Other Ambulatory Visit (HOSPITAL_COMMUNITY): Payer: Self-pay

## 2017-09-06 DIAGNOSIS — F332 Major depressive disorder, recurrent severe without psychotic features: Secondary | ICD-10-CM

## 2017-09-06 MED ORDER — TRAZODONE HCL 100 MG PO TABS: ORAL_TABLET | ORAL | 3 refills | Status: DC

## 2017-09-06 MED ORDER — TRAZODONE HCL 100 MG PO TABS: ORAL_TABLET | ORAL | 2 refills | Status: DC

## 2017-09-28 HISTORY — PX: LUMBAR LAMINECTOMY: SHX95

## 2017-10-28 ENCOUNTER — Other Ambulatory Visit: Payer: Self-pay | Admitting: Sports Medicine

## 2017-10-28 DIAGNOSIS — J3089 Other allergic rhinitis: Secondary | ICD-10-CM

## 2017-11-18 ENCOUNTER — Other Ambulatory Visit (HOSPITAL_COMMUNITY): Payer: Self-pay | Admitting: Psychiatry

## 2017-11-18 DIAGNOSIS — F332 Major depressive disorder, recurrent severe without psychotic features: Secondary | ICD-10-CM

## 2017-11-22 ENCOUNTER — Telehealth (HOSPITAL_COMMUNITY): Payer: Self-pay

## 2017-11-22 ENCOUNTER — Other Ambulatory Visit (HOSPITAL_COMMUNITY): Payer: Self-pay

## 2017-11-22 DIAGNOSIS — F332 Major depressive disorder, recurrent severe without psychotic features: Secondary | ICD-10-CM

## 2017-11-22 MED ORDER — TRAZODONE HCL 100 MG PO TABS: ORAL_TABLET | ORAL | 0 refills | Status: DC

## 2017-11-22 MED ORDER — PAROXETINE HCL 40 MG PO TABS: 40.0000 mg | ORAL_TABLET | Freq: Every morning | ORAL | 0 refills | Status: DC

## 2017-11-22 MED ORDER — LAMOTRIGINE 200 MG PO TABS: 200.0000 mg | ORAL_TABLET | Freq: Every day | ORAL | 0 refills | Status: DC

## 2017-11-22 NOTE — Telephone Encounter (Signed)
Patient states her insurance has changed to [employer] and she now has to use [facility]. I sent medications to [facility] and am waiting to see if anything needs a prior authorization

## 2017-11-28 ENCOUNTER — Telehealth: Payer: Self-pay | Admitting: *Deleted

## 2017-11-28 ENCOUNTER — Other Ambulatory Visit: Payer: Self-pay | Admitting: *Deleted

## 2017-11-28 DIAGNOSIS — J449 Chronic obstructive pulmonary disease, unspecified: Secondary | ICD-10-CM

## 2017-11-28 DIAGNOSIS — G4730 Sleep apnea, unspecified: Secondary | ICD-10-CM

## 2017-11-28 MED ORDER — FLUTICASONE FUROATE-VILANTEROL 100-25 MCG/INH IN AEPB: INHALATION_SPRAY | RESPIRATORY_TRACT | 3 refills | Status: DC

## 2017-11-28 NOTE — Telephone Encounter (Signed)
Pt left vm yesterday requesting a new print out for her lab work because she lost hers.  I returned her call to advise her that I'd faxed her orders.  While talking with pt, she stated that when she's sleeping her "soft palate is flapping back there" and is choking her when she sleeps.  Last sleep study was in 2012 and I advised her we would place an order for her to get a new one.

## 2017-11-28 NOTE — Assessment & Plan Note (Signed)
Not using CPAP, history of mild OSA with an AHI of 5 with an out of date sleep study about a decade ago. She is having excessive daytime sleepiness, severe disruptive snoring, adding an additional home sleep study followed by CPAP titration if needed.

## 2017-11-28 NOTE — Telephone Encounter (Signed)
Sleep study ordered

## 2017-12-12 LAB — COMPREHENSIVE METABOLIC PANEL
AG Ratio: 1.8 (calc) (ref 1.0–2.5)
ALT: 67 U/L — ABNORMAL HIGH (ref 6–29)
Albumin: 4.6 g/dL (ref 3.6–5.1)
BUN/Creatinine Ratio: 8 (calc) (ref 6–22)
CO2: 32 mmol/L (ref 20–32)
Calcium: 9.9 mg/dL (ref 8.6–10.4)
Creat: 1.55 mg/dL — ABNORMAL HIGH (ref 0.50–1.05)
Globulin: 2.5 g/dL (calc) (ref 1.9–3.7)
Glucose, Bld: 105 mg/dL — ABNORMAL HIGH (ref 65–99)
Sodium: 141 mmol/L (ref 135–146)
Total Protein: 7.1 g/dL (ref 6.1–8.1)

## 2017-12-12 LAB — LIPID PANEL W/REFLEX DIRECT LDL
Cholesterol: 327 mg/dL — ABNORMAL HIGH (ref <200)
HDL: 70 mg/dL (ref >50)
LDL Cholesterol (Calc): 225 mg/dL (calc) — ABNORMAL HIGH
Non-HDL Cholesterol (Calc): 257 mg/dL — ABNORMAL HIGH (ref <130)
Total CHOL/HDL Ratio: 4.7 (calc) (ref <5.0)
Triglycerides: 159 mg/dL — ABNORMAL HIGH (ref <150)

## 2017-12-12 LAB — CBC
HCT: 41.9 % (ref 35.0–45.0)
Hemoglobin: 14 g/dL (ref 11.7–15.5)
MCH: 31.3 pg (ref 27.0–33.0)
MCHC: 33.4 g/dL (ref 32.0–36.0)
MCV: 93.7 fL (ref 80.0–100.0)
MPV: 10.4 fL (ref 7.5–12.5)
Platelets: 153 10*3/uL (ref 140–400)
RBC: 4.47 Million/uL (ref 3.80–5.10)
RDW: 14.2 % (ref 11.0–15.0)
WBC: 3.4 10*3/uL — ABNORMAL LOW (ref 3.8–10.8)

## 2017-12-12 LAB — COMPREHENSIVE METABOLIC PANEL WITH GFR
AST: 64 U/L — ABNORMAL HIGH (ref 10–35)
Alkaline phosphatase (APISO): 54 U/L (ref 33–130)
BUN: 12 mg/dL (ref 7–25)
Chloride: 101 mmol/L (ref 98–110)
Potassium: 4.5 mmol/L (ref 3.5–5.3)
Total Bilirubin: 0.6 mg/dL (ref 0.2–1.2)

## 2017-12-12 LAB — HEMOGLOBIN A1C
Hgb A1c MFr Bld: 5.6 % of total Hgb (ref <5.7)
Mean Plasma Glucose: 114 (calc)
eAG (mmol/L): 6.3 (calc)

## 2017-12-12 LAB — TSH: TSH: >150 mIU/L — ABNORMAL HIGH (ref 0.40–4.50)

## 2017-12-18 ENCOUNTER — Other Ambulatory Visit (HOSPITAL_COMMUNITY): Payer: Self-pay | Admitting: Psychiatry

## 2017-12-18 DIAGNOSIS — F332 Major depressive disorder, recurrent severe without psychotic features: Secondary | ICD-10-CM

## 2017-12-19 ENCOUNTER — Telehealth: Payer: Self-pay | Admitting: *Deleted

## 2017-12-19 DIAGNOSIS — E039 Hypothyroidism, unspecified: Secondary | ICD-10-CM

## 2017-12-19 MED ORDER — LEVOTHYROXINE SODIUM 150 MCG PO TABS: 150.0000 ug | ORAL_TABLET | Freq: Every day | ORAL | 3 refills | Status: DC

## 2017-12-19 NOTE — Assessment & Plan Note (Signed)
Noncompliant with levothyroxine, refilling 150 mcg, recheck in 6 weeks.

## 2017-12-19 NOTE — Telephone Encounter (Signed)
Pt notified of rx.    Jennifer [name] can you please figure out what she means by "ins will only pay for 30 days at a time with an 'explanation'".

## 2017-12-19 NOTE — Telephone Encounter (Signed)
I accidentally closed the result note out but pt left vm stating that she has been out of her Levothyroxine for a few months now.  Do you want me to resend the dose in her chart or are you wanting to change the dose per her results? Please advise.

## 2017-12-19 NOTE — Telephone Encounter (Signed)
Refilling levothyroxine 150, recheck TSH in 6 weeks.

## 2017-12-20 NOTE — Telephone Encounter (Signed)
Called the pharmacy and spoke with Jonny RuizJohn pharmacist and he stated that a Rx was ready for pick up and I called the patient to let her know. No PA needed for 30 day supply.

## 2018-01-08 ENCOUNTER — Ambulatory Visit (INDEPENDENT_AMBULATORY_CARE_PROVIDER_SITE_OTHER): Payer: BLUE CROSS/BLUE SHIELD | Admitting: Sports Medicine

## 2018-01-08 DIAGNOSIS — E039 Hypothyroidism, unspecified: Secondary | ICD-10-CM | POA: Diagnosis not present

## 2018-01-08 DIAGNOSIS — L259 Unspecified contact dermatitis, unspecified cause: Secondary | ICD-10-CM | POA: Insufficient documentation

## 2018-01-08 DIAGNOSIS — E785 Hyperlipidemia, unspecified: Secondary | ICD-10-CM

## 2018-01-08 DIAGNOSIS — L239 Allergic contact dermatitis, unspecified cause: Secondary | ICD-10-CM

## 2018-01-08 MED ORDER — TRIAMCINOLONE ACETONIDE 0.5 % EX CREA: 1.0000 "application " | TOPICAL_CREAM | Freq: Two times a day (BID) | CUTANEOUS | 3 refills | Status: DC

## 2018-01-08 MED ORDER — ATORVASTATIN CALCIUM 40 MG PO TABS: 40.0000 mg | ORAL_TABLET | Freq: Every day | ORAL | 3 refills | Status: DC

## 2018-01-08 NOTE — Progress Notes (Signed)
Subjective:    CC: Recently seen at [facility]  HPI: Tinea returns, she is here for hospital follow-up, she was seen in the emergency department, she was profoundly fatigued, confused, she had swelling in her eyes.  She was found to have a TSH over 400, she inadvertently self discontinued her levothyroxine.  She was restarted and starting to feel much better.  Skin rash: Right upper inner arm, itchy.  I reviewed the past medical history, family history, social history, surgical history, and allergies today and no changes were needed.  Please see the problem list section below in epic for further details.  Past Medical History: Past Medical History:  Diagnosis Date  . Alcohol abuse   . Asthmatic bronchitis 2013  . CFS (chronic fatigue syndrome)   . Discoid lupus   . Fibromyalgia   . GERD (gastroesophageal reflux disease) 2013  . Hyperlipidemia   . Postartificial menopausal syndrome   . Sleep apnea   . Thyroid disease    Past Surgical History: Past Surgical History:  Procedure Laterality Date  . ABDOMINAL HYSTERECTOMY  09/2007   TAH with bladder sling  . NASAL SEPTUM SURGERY    . orto surgery plate in leg    . TUBAL LIGATION     bilateral  . uvuloplasty     Social History: Social History   Socioeconomic History  . Marital status: Married    Spouse name: Not on file  . Number of children: 4  . Years of education: Not on file  . Highest education level: Not on file  Occupational History  . Occupation: homemaker    Employer: UNEMPLOYED  Social Needs  . Financial resource strain: Not on file  . Food insecurity:    Worry: Not on file    Inability: Not on file  . Transportation needs:    Medical: Not on file    Non-medical: Not on file  Tobacco Use  . Smoking status: Former Smoker    Packs/day: 0.30    Years: 36.00    Pack years: 10.80    Types: Cigarettes  . Smokeless tobacco: Never Used  . Tobacco comment: Started smoking at age 35.  Substance and Sexual  Activity  . Alcohol use: No    Comment: quit 01-2006/prior alcoholic-in AA  . Drug use: No  . Sexual activity: Never  Lifestyle  . Physical activity:    Days per week: Not on file    Minutes per session: Not on file  . Stress: Not on file  Relationships  . Social connections:    Talks on phone: Not on file    Gets together: Not on file    Attends religious service: Not on file    Active member of club or organization: Not on file    Attends meetings of clubs or organizations: Not on file    Relationship status: Not on file  Other Topics Concern  . Not on file  Social History Narrative  . Not on file   Family History: Family History  Problem Relation Age of Onset  . Hypertension Father   . Alcohol abuse Father   . Heart disease Sister 45       heart attack  . Heart attack Sister   . Depression Brother   . Alcohol abuse Brother        In remission  . Cancer Mother 23       colon  . Depression Brother   . Alcohol abuse Maternal Aunt   .  Alcohol abuse Paternal Uncle        in remission  . Bipolar disorder Daughter 20       Suicide attempt   Allergies: Allergies  Allergen Reactions  . Acetaminophen Other (See Comments)    Pt states she is unable to take this bc of Hashimoto Thyroiditis  . Prednisone     Hallucinations October 2014  . Hydrocodone-Acetaminophen Other (See Comments)    agitation  . Oxycodone Itching and Other (See Comments)    Hallucinating, sweating, itching Pt states she can take this in small doses   Medications: See med rec.  Review of Systems: No fevers, chills, night sweats, weight loss, chest pain, or shortness of breath.   Objective:    General: Well Developed, well nourished, and in no acute distress.  Neuro: Alert and oriented x3, extra-ocular muscles intact, sensation grossly intact.  HEENT: Normocephalic, atraumatic, pupils equal round reactive to light, neck supple, no masses, no lymphadenopathy, thyroid nonpalpable.  Skin: Warm  and dry, there is a 4 cm scaly, minimally erythematous rash without signs of induration or bacterial superinfection. Cardiac: Regular rate and rhythm, no murmurs rubs or gallops, no lower extremity edema.  Respiratory: Clear to auscultation bilaterally. Not using accessory muscles, speaking in full sentences.  Impression and Recommendations:    Hypothyroidism Was recently hospitalized with severe fatigue, confusion. It seems as though she self discontinued her levothyroxine, TSH was over 400 in the hospital, she had severe facial edema, confusion, fatigue. Restarted on 150 mcg of levothyroxine and symptoms are improving considerably. We should recheck her TSH in about 3 to 4 weeks.  Hyperlipidemia with target low density lipoprotein (LDL) cholesterol less than 100 mg/dL Profoundly elevated lipids. She has been off of her atorvastatin.  Contact dermatitis Triamcinolone 0.5% twice daily ___________________________________________ Ihor Austin. Benjamin Stain, M.D., ABFM., CAQSM. Primary Care and Sports Medicine [facility]  Adjunct Professor of Family Medicine  [employer]

## 2018-01-08 NOTE — Assessment & Plan Note (Signed)
Profoundly elevated lipids. She has been off of her atorvastatin.

## 2018-01-08 NOTE — Assessment & Plan Note (Signed)
Triamcinolone 0.5% twice daily

## 2018-01-08 NOTE — Assessment & Plan Note (Signed)
Was recently hospitalized with severe fatigue, confusion. It seems as though she self discontinued her levothyroxine, TSH was over 400 in the hospital, she had severe facial edema, confusion, fatigue. Restarted on 150 mcg of levothyroxine and symptoms are improving considerably. We should recheck her TSH in about 3 to 4 weeks.

## 2018-01-18 DIAGNOSIS — Z9889 Other specified postprocedural states: Secondary | ICD-10-CM | POA: Diagnosis not present

## 2018-01-18 DIAGNOSIS — Z5181 Encounter for therapeutic drug level monitoring: Secondary | ICD-10-CM | POA: Diagnosis not present

## 2018-01-18 DIAGNOSIS — Z79899 Other long term (current) drug therapy: Secondary | ICD-10-CM | POA: Diagnosis not present

## 2018-01-18 DIAGNOSIS — M5136 Other intervertebral disc degeneration, lumbar region: Secondary | ICD-10-CM | POA: Diagnosis not present

## 2018-01-18 DIAGNOSIS — M797 Fibromyalgia: Secondary | ICD-10-CM | POA: Diagnosis not present

## 2018-01-18 DIAGNOSIS — M5030 Other cervical disc degeneration, unspecified cervical region: Secondary | ICD-10-CM | POA: Diagnosis not present

## 2018-03-08 ENCOUNTER — Encounter: Payer: Self-pay | Admitting: Sports Medicine

## 2018-03-08 ENCOUNTER — Ambulatory Visit (INDEPENDENT_AMBULATORY_CARE_PROVIDER_SITE_OTHER): Payer: BLUE CROSS/BLUE SHIELD | Admitting: Sports Medicine

## 2018-03-08 ENCOUNTER — Other Ambulatory Visit: Payer: Self-pay | Admitting: Sports Medicine

## 2018-03-08 DIAGNOSIS — N951 Menopausal and female climacteric states: Secondary | ICD-10-CM

## 2018-03-08 DIAGNOSIS — N3281 Overactive bladder: Secondary | ICD-10-CM | POA: Diagnosis not present

## 2018-03-08 DIAGNOSIS — E785 Hyperlipidemia, unspecified: Secondary | ICD-10-CM | POA: Diagnosis not present

## 2018-03-08 DIAGNOSIS — J3089 Other allergic rhinitis: Secondary | ICD-10-CM

## 2018-03-08 DIAGNOSIS — E039 Hypothyroidism, unspecified: Secondary | ICD-10-CM | POA: Diagnosis not present

## 2018-03-08 DIAGNOSIS — F332 Major depressive disorder, recurrent severe without psychotic features: Secondary | ICD-10-CM | POA: Diagnosis not present

## 2018-03-08 MED ORDER — DULOXETINE HCL 30 MG PO CPEP: 30.0000 mg | ORAL_CAPSULE | Freq: Every day | ORAL | 3 refills | Status: DC

## 2018-03-08 MED ORDER — PAROXETINE HCL 10 MG PO TABS: ORAL_TABLET | ORAL | 0 refills | Status: DC

## 2018-03-08 MED ORDER — MONTELUKAST SODIUM 10 MG PO TABS: 10.0000 mg | ORAL_TABLET | Freq: Every day | ORAL | 3 refills | Status: DC

## 2018-03-08 MED ORDER — MIRABEGRON ER 50 MG PO TB24: 50.0000 mg | ORAL_TABLET | Freq: Every day | ORAL | 3 refills | Status: DC

## 2018-03-08 NOTE — Progress Notes (Addendum)
Subjective:    CC: Follow-up  HPI: Hypothyroidism has been taking her medication at night, she is noticing some increasing hair loss, we never got a chance to recheck her TSH.  Hyperlipidemia: Did recently restart atorvastatin, due to recheck LFTs and lipid panel.  Depression: Has been on fluoxetine for years now, starting to notice increasing anhedonia, fatigue, depressed mood and difficulty sleeping.  She is doing her trazodone at night continues with her Lamictal.  No suicidal or homicidal ideation.  I reviewed the past medical history, family history, social history, surgical history, and allergies today and no changes were needed.  Please see the problem list section below in epic for further details.  Past Medical History: Past Medical History:  Diagnosis Date  . Alcohol abuse   . Asthmatic bronchitis 2013  . CFS (chronic fatigue syndrome)   . Discoid lupus   . Fibromyalgia   . GERD (gastroesophageal reflux disease) 2013  . Hyperlipidemia   . Postartificial menopausal syndrome   . Sleep apnea   . Thyroid disease    Past Surgical History: Past Surgical History:  Procedure Laterality Date  . ABDOMINAL HYSTERECTOMY  09/2007   TAH with bladder sling  . NASAL SEPTUM SURGERY    . orto surgery plate in leg    . TUBAL LIGATION     bilateral  . uvuloplasty     Social History: Social History   Socioeconomic History  . Marital status: Married    Spouse name: Not on file  . Number of children: 4  . Years of education: Not on file  . Highest education level: Not on file  Occupational History  . Occupation: homemaker    Employer: UNEMPLOYED  Social Needs  . Financial resource strain: Not on file  . Food insecurity:    Worry: Not on file    Inability: Not on file  . Transportation needs:    Medical: Not on file    Non-medical: Not on file  Tobacco Use  . Smoking status: Former Smoker    Packs/day: 0.30    Years: 36.00    Pack years: 10.80    Types: Cigarettes    . Smokeless tobacco: Never Used  . Tobacco comment: Started smoking at age 29.  Substance and Sexual Activity  . Alcohol use: No    Comment: quit 01-2006/prior alcoholic-in AA  . Drug use: No  . Sexual activity: Never  Lifestyle  . Physical activity:    Days per week: Not on file    Minutes per session: Not on file  . Stress: Not on file  Relationships  . Social connections:    Talks on phone: Not on file    Gets together: Not on file    Attends religious service: Not on file    Active member of club or organization: Not on file    Attends meetings of clubs or organizations: Not on file    Relationship status: Not on file  Other Topics Concern  . Not on file  Social History Narrative  . Not on file   Family History: Family History  Problem Relation Age of Onset  . Hypertension Father   . Alcohol abuse Father   . Heart disease Sister 45       heart attack  . Heart attack Sister   . Depression Brother   . Alcohol abuse Brother        In remission  . Cancer Mother 65       colon  .  Depression Brother   . Alcohol abuse Maternal Aunt   . Alcohol abuse Paternal Uncle        in remission  . Bipolar disorder Daughter 19       Suicide attempt   Allergies: Allergies  Allergen Reactions  . Acetaminophen Other (See Comments)    Pt states she is unable to take this bc of Hashimoto Thyroiditis  . Prednisone     Hallucinations October 2014  . Hydrocodone-Acetaminophen Other (See Comments)    agitation  . Oxycodone Itching and Other (See Comments)    Hallucinating, sweating, itching Pt states she can take this in small doses   Medications: See med rec.  Review of Systems: No fevers, chills, night sweats, weight loss, chest pain, or shortness of breath.   Objective:    General: Well Developed, well nourished, and in no acute distress.  Neuro: Alert and oriented x3, extra-ocular muscles intact, sensation grossly intact.  HEENT: Normocephalic, atraumatic, pupils equal  round reactive to light, neck supple, no masses, no lymphadenopathy, thyroid nonpalpable.  Skin: Warm and dry, no rashes. Cardiac: Regular rate and rhythm, no murmurs rubs or gallops, no lower extremity edema.  Respiratory: Clear to auscultation bilaterally. Not using accessory muscles, speaking in full sentences.  Impression and Recommendations:    Overactive bladder Failure of multiple OAB medications, she did well with Myrbetriq in the past, calling this in again.   Hypothyroidism Rechecking TSH. Continue fatigue and hair loss, if thyroid function is normal we will further evaluate this. Continue levothyroxine but she needs to take in the morning.  Thyroid function is now well controlled.  Major depressive disorder, recurrent episode, severe Has been approximating for a long time now, switching to Cymbalta low-dose. Keep follow-up with psychiatry.  Hyperlipidemia with target low density lipoprotein (LDL) cholesterol less than 100 mg/dL Rechecking nonfasting lipids today.  Lipids look okay, triglycerides are still a bit elevated, add Lovaza 4 g daily to the atorvastatin.  Menopausal syndrome Currently in menopausal syndrome, with some hair loss, not due to thyroid dysfunction. Adding topical minoxidil. If insufficient improvement after a month or 2 we will have dermatology weigh in. ___________________________________________ Ihor Austin. Benjamin Stain, M.D., ABFM., CAQSM. Primary Care and Sports Medicine [facility]  Adjunct Professor of Family Medicine  [employer]

## 2018-03-08 NOTE — Assessment & Plan Note (Signed)
Has been approximating for a long time now, switching to Cymbalta low-dose. Keep follow-up with psychiatry.

## 2018-03-08 NOTE — Assessment & Plan Note (Signed)
Failure of multiple OAB medications, she did well with Myrbetriq in the past, calling this in again.

## 2018-03-08 NOTE — Assessment & Plan Note (Addendum)
Rechecking TSH. Continue fatigue and hair loss, if thyroid function is normal we will further evaluate this. Continue levothyroxine but she needs to take in the morning.  Thyroid function is now well controlled.

## 2018-03-08 NOTE — Assessment & Plan Note (Addendum)
Rechecking nonfasting lipids today.  Lipids look okay, triglycerides are still a bit elevated, add Lovaza 4 g daily to the atorvastatin.

## 2018-03-09 LAB — COMPREHENSIVE METABOLIC PANEL
AG Ratio: 2.3 (calc) (ref 1.0–2.5)
ALT: 25 U/L (ref 6–29)
AST: 21 U/L (ref 10–35)
Albumin: 4.3 g/dL (ref 3.6–5.1)
Alkaline phosphatase (APISO): 52 U/L (ref 37–153)
BUN: 9 mg/dL (ref 7–25)
CO2: 29 mmol/L (ref 20–32)
Calcium: 8.9 mg/dL (ref 8.6–10.4)
Chloride: 107 mmol/L (ref 98–110)
Creat: 1.09 mg/dL — ABNORMAL HIGH (ref 0.50–0.99)
Globulin: 1.9 g/dL (calc) (ref 1.9–3.7)
Glucose, Bld: 103 mg/dL (ref 65–139)
Potassium: 4.5 mmol/L (ref 3.5–5.3)
Sodium: 142 mmol/L (ref 135–146)
Total Bilirubin: 0.4 mg/dL (ref 0.2–1.2)
Total Protein: 6.2 g/dL (ref 6.1–8.1)

## 2018-03-09 LAB — CBC
HCT: 40 % (ref 35.0–45.0)
Hemoglobin: 14 g/dL (ref 11.7–15.5)
MCH: 32.2 pg (ref 27.0–33.0)
MCHC: 35 g/dL (ref 32.0–36.0)
MCV: 92 fL (ref 80.0–100.0)
MPV: 10.3 fL (ref 7.5–12.5)
Platelets: 178 10*3/uL (ref 140–400)
RBC: 4.35 10*6/uL (ref 3.80–5.10)
RDW: 12.3 % (ref 11.0–15.0)
WBC: 4.5 Thousand/uL (ref 3.8–10.8)

## 2018-03-09 LAB — LIPID PANEL W/REFLEX DIRECT LDL
Cholesterol: 155 mg/dL (ref <200)
HDL: 54 mg/dL (ref >=50)
LDL Cholesterol (Calc): 74 mg/dL (calc)
Non-HDL Cholesterol (Calc): 101 mg/dL (calc) (ref <130)
Total CHOL/HDL Ratio: 2.9 (calc) (ref <5.0)
Triglycerides: 169 mg/dL — ABNORMAL HIGH (ref <150)

## 2018-03-09 LAB — TSH: TSH: 1.54 mIU/L (ref 0.40–4.50)

## 2018-03-09 LAB — COMPREHENSIVE METABOLIC PANEL WITH GFR: BUN/Creatinine Ratio: 8 (calc) (ref 6–22)

## 2018-03-09 MED ORDER — OMEGA-3-ACID ETHYL ESTERS 1 G PO CAPS: 2.0000 g | ORAL_CAPSULE | Freq: Two times a day (BID) | ORAL | 3 refills | Status: DC

## 2018-03-09 MED ORDER — MINOXIDIL 5 % EX FOAM: 1.0000 "application " | Freq: Two times a day (BID) | CUTANEOUS | 11 refills | Status: DC

## 2018-03-09 NOTE — Assessment & Plan Note (Signed)
Currently in menopausal syndrome, with some hair loss, not due to thyroid dysfunction. Adding topical minoxidil. If insufficient improvement after a month or 2 we will have dermatology weigh in.

## 2018-03-09 NOTE — Addendum Note (Signed)
Addended by: Monica Becton on: 03/09/2018 08:45 AM   Modules accepted: Orders

## 2018-03-21 ENCOUNTER — Other Ambulatory Visit: Payer: Self-pay

## 2018-03-21 ENCOUNTER — Ambulatory Visit (INDEPENDENT_AMBULATORY_CARE_PROVIDER_SITE_OTHER): Payer: BLUE CROSS/BLUE SHIELD | Admitting: Sports Medicine

## 2018-03-21 ENCOUNTER — Encounter: Payer: Self-pay | Admitting: Sports Medicine

## 2018-03-21 DIAGNOSIS — L930 Discoid lupus erythematosus: Secondary | ICD-10-CM | POA: Diagnosis not present

## 2018-03-21 MED ORDER — METHOTREXATE SODIUM 7.5 MG PO TABS: 7.5000 mg | ORAL_TABLET | ORAL | 1 refills | Status: DC

## 2018-03-21 NOTE — Progress Notes (Signed)
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  She had a few flares along the past few years.  More recently over the past few days she is had increasing joint aches, pains, feels like her typical flares.  She has not seen her rheumatologist or her dermatologist in many years.  Looking back she has had good responses to methotrexate.  It is are moderate, persistent, localized in the interphalangeal joints without radiation, she has also noted increasing hair loss.  I reviewed the past medical history, family history, social history, surgical history, and allergies today and no changes were needed.  Please see the problem list section below in epic for further details.  Past Medical History: Past Medical History:  Diagnosis Date  . Alcohol abuse   . Asthmatic bronchitis 2013  . CFS (chronic fatigue syndrome)   . Discoid lupus   . Fibromyalgia   . GERD (gastroesophageal reflux disease) 2013  . Hyperlipidemia   . Postartificial menopausal syndrome   . Sleep apnea   . Thyroid disease    Past Surgical History: Past Surgical History:  Procedure Laterality Date  . ABDOMINAL HYSTERECTOMY  09/2007   TAH with bladder sling  . NASAL SEPTUM SURGERY    . orto surgery plate in leg    . TUBAL LIGATION     bilateral  . uvuloplasty     Social History: Social History   Socioeconomic History  . Marital status: Married    Spouse name: Not on file  . Number of children: 4  . Years of education: Not on file  . Highest education level: Not on file  Occupational History  . Occupation: homemaker    Employer: UNEMPLOYED  Social Needs  . Financial resource strain: Not on file  . Food insecurity:    Worry: Not on file    Inability: Not on file  . Transportation needs:    Medical: Not on file    Non-medical: Not on file  Tobacco Use  . Smoking status: Former Smoker    Packs/day: 0.30   Years: 36.00    Pack years: 10.80    Types: Cigarettes  . Smokeless tobacco: Never Used  . Tobacco comment: Started smoking at age 45.  Substance and Sexual Activity  . Alcohol use: No    Comment: quit 01-2006/prior alcoholic-in AA  . Drug use: No  . Sexual activity: Never  Lifestyle  . Physical activity:    Days per week: Not on file    Minutes per session: Not on file  . Stress: Not on file  Relationships  . Social connections:    Talks on phone: Not on file    Gets together: Not on file    Attends religious service: Not on file    Active member of club or organization: Not on file    Attends meetings of clubs or organizations: Not on file    Relationship status: Not on file  Other Topics Concern  . Not on file  Social History Narrative  . Not on file   Family History: Family History  Problem Relation Age of Onset  . Hypertension Father   . Alcohol abuse Father   . Heart disease Sister 45       heart attack  . Heart attack Sister   . Depression Brother   . Alcohol abuse Brother        In  remission  . Cancer Mother 79       colon  . Depression Brother   . Alcohol abuse Maternal Aunt   . Alcohol abuse Paternal Uncle        in remission  . Bipolar disorder Daughter 65       Suicide attempt   Allergies: Allergies  Allergen Reactions  . Acetaminophen Other (See Comments)    Pt states she is unable to take this bc of Hashimoto Thyroiditis  . Prednisone     Hallucinations October 2014  . Hydrocodone-Acetaminophen Other (See Comments)    agitation  . Oxycodone Itching and Other (See Comments)    Hallucinating, sweating, itching Pt states she can take this in small doses   Medications: See med rec.  Review of Systems: No fevers, chills, night sweats, weight loss, chest pain, or shortness of breath.   Objective:    General: Well Developed, well nourished, and in no acute distress.  Neuro: Alert and oriented x3, extra-ocular muscles intact, sensation grossly  intact.  HEENT: Normocephalic, atraumatic, pupils equal round reactive to light, neck supple, no masses, no lymphadenopathy, thyroid nonpalpable.  Skin: Warm and dry, no rashes. Cardiac: Regular rate and rhythm, no murmurs rubs or gallops, no lower extremity edema.  Respiratory: Clear to auscultation bilaterally. Not using accessory muscles, speaking in full sentences.  Impression and Recommendations:    Discoid lupus Possible discoid lupus flare. Repeating labs. She has seen a rheumatologist in the past, methotrexate 7.5 mg weekly seemed to be efficacious. Hydroxychloroquine was minimally efficacious. I am going to do another referral to rheumatology. I would also like her to touch base with dermatology in the area.   ___________________________________________ Ihor Austin. Benjamin Stain, M.D., ABFM., CAQSM. Primary Care and Sports Medicine [facility]  Adjunct Professor of Family Medicine  [employer]

## 2018-03-21 NOTE — Assessment & Plan Note (Signed)
Possible discoid lupus flare. Repeating labs. She has seen a rheumatologist in the past, methotrexate 7.5 mg weekly seemed to be efficacious. Hydroxychloroquine was minimally efficacious. I am going to do another referral to rheumatology. I would also like her to touch base with dermatology in the area.

## 2018-03-26 LAB — C-REACTIVE PROTEIN: CRP: 1.2 mg/L (ref <8.0)

## 2018-03-26 LAB — ANA, IFA COMPREHENSIVE PANEL
Anti Nuclear Antibody (ANA): NEGATIVE
ENA SM Ab Ser-aCnc: <1 AI
SM/RNP: <1 AI
SSA (Ro) (ENA) Antibody, IgG: <1 AI
SSB (La) (ENA) Antibody, IgG: <1 AI
Scleroderma (Scl-70) (ENA) Antibody, IgG: <1 AI
ds DNA Ab: <1 IU/mL

## 2018-03-26 LAB — SEDIMENTATION RATE: Sed Rate: 6 mm/h (ref 0–30)

## 2018-03-26 LAB — RHEUMATOID ARTHRITIS DIAGNOSTIC PANEL, COMPREHENSIVE
Cyclic Citrullin Peptide Ab: <16 Units (ref <20)
Rheumatoid Factor (IgA): <5 U (ref <=6)
Rheumatoid Factor (IgG): <5 U (ref <=6)
Rheumatoid Factor (IgM): 9 U — ABNORMAL HIGH (ref <=6)
SSA (Ro) (ENA) Antibody, IgG: <1 AI
SSB (La) (ENA) Antibody, IgG: <1 AI

## 2018-03-30 ENCOUNTER — Telehealth: Payer: Self-pay

## 2018-03-30 NOTE — Telephone Encounter (Signed)
Jennifer [name] complains she is in a lot of pain in her neck. She wanted to know if it was time get another shot. She would also like an increase in the Tramadol.

## 2018-03-30 NOTE — Telephone Encounter (Signed)
She has not had the neck injection in a long time (03/2016), let us set her up to do a WebEx or a telephone call to discuss this in further detail and discuss pain medications.

## 2018-04-02 NOTE — Telephone Encounter (Signed)
Left message advising of recommendations.  

## 2018-04-04 ENCOUNTER — Other Ambulatory Visit: Payer: Self-pay | Admitting: Sports Medicine

## 2018-04-04 DIAGNOSIS — E039 Hypothyroidism, unspecified: Secondary | ICD-10-CM

## 2018-04-13 ENCOUNTER — Ambulatory Visit (INDEPENDENT_AMBULATORY_CARE_PROVIDER_SITE_OTHER): Payer: BLUE CROSS/BLUE SHIELD | Admitting: Psychiatry

## 2018-04-13 ENCOUNTER — Other Ambulatory Visit: Payer: Self-pay

## 2018-04-13 ENCOUNTER — Encounter (HOSPITAL_COMMUNITY): Payer: Self-pay | Admitting: Psychiatry

## 2018-04-13 DIAGNOSIS — F332 Major depressive disorder, recurrent severe without psychotic features: Secondary | ICD-10-CM | POA: Diagnosis not present

## 2018-04-13 DIAGNOSIS — M797 Fibromyalgia: Secondary | ICD-10-CM

## 2018-04-13 DIAGNOSIS — F411 Generalized anxiety disorder: Secondary | ICD-10-CM | POA: Diagnosis not present

## 2018-04-13 MED ORDER — LAMOTRIGINE 200 MG PO TABS: 200.0000 mg | ORAL_TABLET | Freq: Every day | ORAL | 0 refills | Status: DC

## 2018-04-13 MED ORDER — TRAZODONE HCL 100 MG PO TABS: ORAL_TABLET | ORAL | 0 refills | Status: DC

## 2018-04-13 NOTE — Progress Notes (Signed)
Patient ID: Jennifer SchleinColleen M [name], female   DOB: 10/03/1960, 58 y.o.   MRN: [id number]   [facility] Telepsych visit Jennifer SchleinColleen M [name] 03/19/1960  Date: 08/03/2017  HPI :  Jennifer [name] is a 58 year old female with a diagnosis of major Depressive Disorder. 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    I connected with Jennifer [name] on 04/13/18 at  1:00 PM EDT by telephone and verified that I am speaking with the correct person using two identifiers.   I discussed the limitations, risks, security and privacy concerns of performing an evaluation and management service by telephone and the availability of in person appointments. I also discussed with the patient that there may be a patient responsible charge related to this service. The patient expressed understanding and agreed to proceed.  Pain still effects mood feeling dysphoric, primary care giving cymbalta, paxil was stopped Says on tramadol and also gabapentin.  Feels meds not strong for pain and it effects her Cant sleep without trazadone, husband is supportive Denies flushing.  Still subdued, not suicidal     daughter is supportive   . Severity: Depression: 6/10 10 being no depression Anxiety- fluctuates     Review of Systems  Cardiovascular: Negative for chest pain.  Gastrointestinal: Negative for nausea.  Musculoskeletal: Positive for myalgias.  Skin: Negative for itching.  Neurological: Negative for tremors and headaches.  Psychiatric/Behavioral: Positive for depression. Negative for substance abuse and suicidal ideas.     Physical Exam  Vitals reviewed.  Constitutional: She appears well-developed and well-nourished. No distress.  Skin: She is not diaphoretic.     Past Medical History: Reviewed.  Past Medical History   Diagnosis  Date   .  Discoid lupus    .  Thyroid disease    .  Perimenopausal    .  Fibromyalgia    .  CFS (chronic fatigue syndrome)    .   Sleep apnea      Allergies: Reviewed.  Allergies   Allergen  Reactions   .  Acetaminophen      Pt states she is unable to take this bc of Hashimoto Thyroid   .  Hydrocodone-Acetaminophen      REACTION: agitation   .  Oxycodone      Hallucinating, sweating, itching    Current Medications: Reviewed.  Current Outpatient Medications on File Prior to Visit  Medication Sig Dispense Refill  . albuterol (PROVENTIL HFA) 108 (90 Base) MCG/ACT inhaler Inhale into the lungs.    Marland Kitchen. atorvastatin (LIPITOR) 40 MG tablet Take 1 tablet (40 mg total) by mouth daily at 6 PM. 90 tablet 3  . DULoxetine (CYMBALTA) 30 MG capsule Take 1 capsule (30 mg total) by mouth daily. 30 capsule 3  . fluticasone (FLONASE) 50 MCG/ACT nasal spray One spray in each nostril twice a day, use left hand for right nostril, and right hand for left nostril. 48 g 3  . fluticasone furoate-vilanterol (BREO ELLIPTA) 100-25 MCG/INH AEPB USE 1 INHALATION DAILY 180 each 3  . gabapentin (NEURONTIN) 800 MG tablet TAKE 2 TABLETS(1600 MG) BY MOUTH TWICE DAILY 120 tablet 0  . levothyroxine (SYNTHROID, LEVOTHROID) 150 MCG tablet TAKE 1 TABLET BY MOUTH DAILY BEFORE BREAKFAST 30 tablet 3  . methotrexate (RHEUMATREX) 7.5 MG tablet Take 1 tablet (7.5 mg total) by mouth once a week. Caution" Chemotherapy. Protect from light. 4 tablet 1  . Minoxidil 5 % FOAM Apply 1 application topically 2 (two) times daily. Apply twice  daily for up to 4 months 1 Can 11  . mirabegron ER (MYRBETRIQ) 50 MG TB24 tablet Take 1 tablet (50 mg total) by mouth daily. 30 tablet 3  . montelukast (SINGULAIR) 10 MG tablet Take 1 tablet (10 mg total) by mouth daily. 90 tablet 3  . omega-3 acid ethyl esters (LOVAZA) 1 g capsule Take 2 capsules (2 g total) by mouth 2 (two) times daily. 360 capsule 3  . PARoxetine (PAXIL) 10 MG tablet 3 tabs daily for 3 days then 2 tabs daily for 3 days then 1 tab daily for 3 days then stop 20 tablet 0  . tiZANidine (ZANAFLEX) 4 MG tablet     .  triamcinolone cream (KENALOG) 0.5 % Apply 1 application topically 2 (two) times daily. To affected areas. 30 g 3   No current facility-administered medications on file prior to visit.        Family History: Reviewed.  Family History   Problem  Relation  Age of Onset   .  Hypertension  Father    .  Heart disease  Sister  45      heart attack    .  Depression  Brother    .  Cancer  Mother  37      colon    Psychiatric Specialty Exam:     Speech:  Clear and Coherent and Normal Rate  Volume:  Normal  Mood:subdued  Affect: congruent  Thought Process:  clear  Orientation:  Full (Time, Place, and Person)  Thought Content:  WDL  Suicidal Thoughts:  No  Homicidal Thoughts:  No  Memory:  fair  Judgement:  Good  Insight:  Fair  Psychomotor Activity:  decreased  Concentration:  Fair  Recall:  Poor  Akathisia:  No  Handed:  Right  Fund of knowledge-average to above average  Language-Intact  AIMS (if indicated):     Assets:  Manufacturing systems engineer Desire for Improvement Financial Resources/Insurance Housing Intimacy Leisure Time Physical Health Resilience Social Support Talents/Skills Transportation Vocational/Educational  Language intact   Fund of knowledge is good      Assessment:  Major Depression, Recurrent severe-stable Alcohol Dependence in full sustained remission-stable AXIS I  Major Depression, Recurrent severe, Alcohol Dependence in full sustained remission . Fibromyalgia.     Treatment Plan/Recommendations:   Major depression; dysphoric, can increase cymbalta to bid . Not more then 60mg  Discussed not to take more then 2 trazadone at night to avoid any high serotonin levels Pain remains  A concern effecting mood, follows with providers  Anxiety disorder; at times more. Continue cymbalta and increase to bid as above call back in few days if any concerns or unwanted side effects Insomnia; has to take trazadone. Reviewed sleep hygiene Fibromyalgia;  some more worse relavant to pain effecting it Increase cymbalta as above  I discussed the assessment and treatment plan with the patient. The patient was provided an opportunity to ask questions and all were answered. The patient agreed with the plan and demonstrated an understanding of the instructions.   The patient was advised to call back or seek an in-person evaluation if the symptoms worsen or if the condition fails to improve as anticipated. Fu 3-4 w or earlier if needed  I provided 15 minutes of non-face-to-face time during this encounter. Thresa Ross, MD

## 2018-04-27 ENCOUNTER — Encounter: Payer: Self-pay | Admitting: Physician Assistant

## 2018-04-27 ENCOUNTER — Telehealth (INDEPENDENT_AMBULATORY_CARE_PROVIDER_SITE_OTHER): Payer: BLUE CROSS/BLUE SHIELD | Admitting: Physician Assistant

## 2018-04-27 VITALS — Temp 98.3°F

## 2018-04-27 DIAGNOSIS — J449 Chronic obstructive pulmonary disease, unspecified: Secondary | ICD-10-CM

## 2018-04-27 DIAGNOSIS — H1013 Acute atopic conjunctivitis, bilateral: Secondary | ICD-10-CM

## 2018-04-27 MED ORDER — ALBUTEROL SULFATE HFA 108 (90 BASE) MCG/ACT IN AERS: 1.0000 | INHALATION_SPRAY | RESPIRATORY_TRACT | 3 refills | Status: AC | PRN

## 2018-04-27 MED ORDER — AZELASTINE HCL 0.05 % OP SOLN: 1.0000 [drp] | Freq: Two times a day (BID) | OPHTHALMIC | 0 refills | Status: DC | PRN

## 2018-04-27 MED ORDER — DOXYCYCLINE HYCLATE 100 MG PO TABS: 100.0000 mg | ORAL_TABLET | Freq: Two times a day (BID) | ORAL | 0 refills | Status: AC

## 2018-04-27 NOTE — Progress Notes (Signed)
Virtual Visit via Video Note  I connected with Jennifer [name] on 04/27/18 at  4:00 PM EDT by a video enabled telemedicine application and verified that I am speaking with the correct person using two identifiers.   I discussed the limitations of evaluation and management by telemedicine and the availability of in person appointments. The patient expressed understanding and agreed to proceed.  History of Present Illness: HPI:                                                                Jennifer [name] is a 59 y.o. female wit PMH COPD, lupus, allergic rhinitis  CC: sinus pressure  Onset 5 days ago Bilateral sinus pressure with yellow sinus drainage Endorses a dry cough with mucus coming up into her throat but unable to expectorate it. Endorses "a little" chest tightness." She is compliant with her Breo daily. She does not have a rescue inhaler No fever of dyspnea  Also states eyes have been itchy and burning for several days.  Past Medical History:  Diagnosis Date  . Alcohol abuse   . Asthmatic bronchitis 2013  . CFS (chronic fatigue syndrome)   . Discoid lupus   . Fibromyalgia   . GERD (gastroesophageal reflux disease) 2013  . Hyperlipidemia   . Postartificial menopausal syndrome   . Sleep apnea   . Thyroid disease    Past Surgical History:  Procedure Laterality Date  . ABDOMINAL HYSTERECTOMY  09/2007   TAH with bladder sling  . NASAL SEPTUM SURGERY    . orto surgery plate in leg    . TUBAL LIGATION     bilateral  . uvuloplasty     Social History   Tobacco Use  . Smoking status: Former Smoker    Packs/day: 0.30    Years: 36.00    Pack years: 10.80    Types: Cigarettes  . Smokeless tobacco: Never Used  . Tobacco comment: Started smoking at age 54.  Substance Use Topics  . Alcohol use: No    Comment: quit 01-2006/prior alcoholic-in AA   family history includes Alcohol abuse in her brother, father, maternal aunt, and paternal uncle; Bipolar disorder (age of  onset: 32) in her daughter; Cancer (age of onset: 34) in her mother; Depression in her brother and brother; Heart attack in her sister; Heart disease (age of onset: 85) in her sister; Hypertension in her father.    ROS: negative except as noted in the HPI  Medications: Current Outpatient Medications  Medication Sig Dispense Refill  . albuterol (PROVENTIL HFA) 108 (90 Base) MCG/ACT inhaler Inhale into the lungs.    Marland Kitchen atorvastatin (LIPITOR) 40 MG tablet Take 1 tablet (40 mg total) by mouth daily at 6 PM. 90 tablet 3  . DULoxetine (CYMBALTA) 30 MG capsule Take 1 capsule (30 mg total) by mouth daily. 30 capsule 3  . fluticasone (FLONASE) 50 MCG/ACT nasal spray One spray in each nostril twice a day, use left hand for right nostril, and right hand for left nostril. 48 g 3  . fluticasone furoate-vilanterol (BREO ELLIPTA) 100-25 MCG/INH AEPB USE 1 INHALATION DAILY 180 each 3  . gabapentin (NEURONTIN) 800 MG tablet TAKE 2 TABLETS(1600 MG) BY MOUTH TWICE DAILY 120 tablet 0  . lamoTRIgine (LAMICTAL) 200 MG  tablet Take 1 tablet (200 mg total) by mouth daily. 90 tablet 0  . levothyroxine (SYNTHROID, LEVOTHROID) 150 MCG tablet TAKE 1 TABLET BY MOUTH DAILY BEFORE BREAKFAST 30 tablet 3  . methotrexate (RHEUMATREX) 7.5 MG tablet Take 1 tablet (7.5 mg total) by mouth once a week. Caution" Chemotherapy. Protect from light. 4 tablet 1  . Minoxidil 5 % FOAM Apply 1 application topically 2 (two) times daily. Apply twice daily for up to 4 months 1 Can 11  . mirabegron ER (MYRBETRIQ) 50 MG TB24 tablet Take 1 tablet (50 mg total) by mouth daily. 30 tablet 3  . montelukast (SINGULAIR) 10 MG tablet Take 1 tablet (10 mg total) by mouth daily. 90 tablet 3  . omega-3 acid ethyl esters (LOVAZA) 1 g capsule Take 2 capsules (2 g total) by mouth 2 (two) times daily. 360 capsule 3  . PARoxetine (PAXIL) 10 MG tablet 3 tabs daily for 3 days then 2 tabs daily for 3 days then 1 tab daily for 3 days then stop 20 tablet 0  .  tiZANidine (ZANAFLEX) 4 MG tablet     . traZODone (DESYREL) 100 MG tablet Two or three at night 270 tablet 0  . triamcinolone cream (KENALOG) 0.5 % Apply 1 application topically 2 (two) times daily. To affected areas. 30 g 3   No current facility-administered medications for this visit.    Allergies  Allergen Reactions  . Acetaminophen Other (See Comments)    Jennifer [name] states she is unable to take this bc of Hashimoto Thyroiditis  . Prednisone     Hallucinations October 2014  . Hydrocodone-Acetaminophen Other (See Comments)    agitation  . Oxycodone Itching and Other (See Comments)    Hallucinating, sweating, itching Jennifer [name] states she can take this in small doses       Objective:  Temp 98.3 F (36.8 C) (Oral)  Gen:  alert, not ill-appearing, no distress, appropriate for age HEENT: head normocephalic without obvious abnormality, conjunctiva and cornea clear, trachea midline Pulm: Normal work of breathing, normal phonation, speaking in full sentences Neuro: alert and oriented x 3   No results found for this or any previous visit (from the past 72 hour(s)). No results found.    Assessment and Plan: 58 y.o. female with   .Jennifer [name] was seen today for nasal congestion.  Diagnoses and all orders for this visit:  Chronic obstructive pulmonary disease, unspecified COPD type (HCC) -     albuterol (PROVENTIL HFA) 108 (90 Base) MCG/ACT inhaler; Inhale 1-2 puffs into the lungs every 4 (four) hours as needed for wheezing or shortness of breath. -     doxycycline (VIBRA-TABS) 100 MG tablet; Take 1 tablet (100 mg total) by mouth 2 (two) times daily for 7 days.  Allergic conjunctivitis of both eyes -     azelastine (OPTIVAR) 0.05 % ophthalmic solution; Place 1 drop into both eyes 2 (two) times daily as needed (itchy/watery eyes).   Patient is not ill-appearing, speaking in full sentences, no respiratory distress Treating for mild COPD exacerbation with doxycycline Patient declined  prednisone Also adding albuterol as she does not currently have any rescue medication  Appears that she is also having some symptoms of allergic rhinoconjunctivitis.  Recommended she start an over-the-counter nondrowsy antihistamine such as Zyrtec in addition to her Singulair and Flonase.  Adding azelastine eyedrops.  Close follow-up with PCP in 3 days  Follow Up Instructions:    I discussed the assessment and treatment plan with the patient. The patient  was provided an opportunity to ask questions and all were answered. The patient agreed with the plan and demonstrated an understanding of the instructions.   The patient was advised to call back or seek an in-person evaluation if the symptoms worsen or if the condition fails to improve as anticipated.  I provided approx 25 minutes of non-face-to-face time during this encounter.   Carlis Stableharley Elizabeth Joanmarie Tsang, [place] JerseyPA-C

## 2018-04-30 ENCOUNTER — Encounter: Payer: Self-pay | Admitting: Physician Assistant

## 2018-05-09 ENCOUNTER — Telehealth: Payer: Self-pay | Admitting: *Deleted

## 2018-05-09 NOTE — Telephone Encounter (Signed)
100% absolutely, lets do a virtual visit now unless she can come in today!

## 2018-05-09 NOTE — Telephone Encounter (Signed)
Pt left vm yesterday evening stating that for a few weeks now she's had a dry cough, some phlegm, and some chest tightness.  She has been using her Albuterol inhaler.  Pt wanted to know if she should make an appointment to see you.  Please advise.

## 2018-05-09 NOTE — Telephone Encounter (Signed)
Jennifer [name] will call pt to get her scheduled for today.

## 2018-05-18 ENCOUNTER — Ambulatory Visit (INDEPENDENT_AMBULATORY_CARE_PROVIDER_SITE_OTHER): Payer: BLUE CROSS/BLUE SHIELD | Admitting: Sports Medicine

## 2018-05-18 ENCOUNTER — Encounter: Payer: Self-pay | Admitting: Sports Medicine

## 2018-05-18 DIAGNOSIS — J411 Mucopurulent chronic bronchitis: Secondary | ICD-10-CM | POA: Diagnosis not present

## 2018-05-18 MED ORDER — PREDNISONE 50 MG PO TABS: 50.0000 mg | ORAL_TABLET | Freq: Every day | ORAL | 0 refills | Status: DC

## 2018-05-18 MED ORDER — FLUTICASONE-UMECLIDIN-VILANT 100-62.5-25 MCG/INH IN AEPB: 1.0000 | INHALATION_SPRAY | Freq: Every day | RESPIRATORY_TRACT | 11 refills | Status: DC

## 2018-05-18 NOTE — Assessment & Plan Note (Signed)
No true COPD, just recurrent episodes of bronchitis, she likely has some degree of asthma, she does get occasional cough, and asthma would not present on a regular spirometry test if asymptomatic. She does do Breo consistently, we are going to switch from [place] to Trelegy, and add a 5-day burst of prednisone. Return to see me in person if no better in 1 week.

## 2018-05-18 NOTE — Progress Notes (Signed)
Virtual Visit via Telephone   I connected with  Ewing Schlein  on 05/18/18 by telephone/telehealth and verified that I am speaking with the correct person using two identifiers.   I discussed the limitations, risks, security and privacy concerns of performing an evaluation and management service by telephone, including the higher likelihood of inaccurate diagnosis and treatment, and the availability of in person appointments.  We also discussed the likely need of an additional face to face encounter for complete and high quality delivery of care.  I also discussed with the patient that there may be a patient responsible charge related to this service. The patient expressed understanding and wishes to proceed.  Provider location is either at home or medical facility. Patient location is at their home, different from provider location. People involved in care of the patient during this telehealth encounter were myself, my nurse/medical assistant, and my front office/scheduling team member.  Subjective:    CC: Mild cough  HPI: Maydell is a pleasant 58 year old female, we have dealt with mild intermittent wheezing, cough, shortness of breath, she has had 2 normal lung function tests, effectively ruling out COPD.  Ultimately we suspected intermittent mild persistent asthma and started Breo.  She did really well with Breo until about 3 weeks ago, started to have increasing mild chest heaviness without nausea, diaphoresis, palpitations, not exertional, no shortness of breath, mild cough.  No fevers, chills.  I reviewed the past medical history, family history, social history, surgical history, and allergies today and no changes were needed.  Please see the problem list section below in epic for further details.  Past Medical History: Past Medical History:  Diagnosis Date  . Alcohol abuse   . Asthmatic bronchitis 2013  . CFS (chronic fatigue syndrome)   . Discoid lupus   . Fibromyalgia   .  GERD (gastroesophageal reflux disease) 2013  . Hyperlipidemia   . Postartificial menopausal syndrome   . Sleep apnea   . Thyroid disease    Past Surgical History: Past Surgical History:  Procedure Laterality Date  . ABDOMINAL HYSTERECTOMY  09/2007   TAH with bladder sling  . NASAL SEPTUM SURGERY    . orto surgery plate in leg    . TUBAL LIGATION     bilateral  . uvuloplasty     Social History: Social History   Socioeconomic History  . Marital status: Married    Spouse name: Not on file  . Number of children: 4  . Years of education: Not on file  . Highest education level: Not on file  Occupational History  . Occupation: homemaker    Employer: UNEMPLOYED  Social Needs  . Financial resource strain: Not on file  . Food insecurity:    Worry: Not on file    Inability: Not on file  . Transportation needs:    Medical: Not on file    Non-medical: Not on file  Tobacco Use  . Smoking status: Former Smoker    Packs/day: 0.30    Years: 36.00    Pack years: 10.80    Types: Cigarettes  . Smokeless tobacco: Never Used  . Tobacco comment: Started smoking at age 21.  Substance and Sexual Activity  . Alcohol use: No    Comment: quit 01-2006/prior alcoholic-in AA  . Drug use: No  . Sexual activity: Never  Lifestyle  . Physical activity:    Days per week: Not on file    Minutes per session: Not on file  . Stress:  Not on file  Relationships  . Social connections:    Talks on phone: Not on file    Gets together: Not on file    Attends religious service: Not on file    Active member of club or organization: Not on file    Attends meetings of clubs or organizations: Not on file    Relationship status: Not on file  Other Topics Concern  . Not on file  Social History Narrative  . Not on file   Family History: Family History  Problem Relation Age of Onset  . Hypertension Father   . Alcohol abuse Father   . Heart disease Sister 45       heart attack  . Heart attack  Sister   . Depression Brother   . Alcohol abuse Brother        In remission  . Cancer Mother [id number]       colon  . Depression Brother   . Alcohol abuse Maternal Aunt   . Alcohol abuse Paternal Uncle        in remission  . Bipolar disorder Daughter [phone]       Suicide attempt   Allergies: Allergies  Allergen Reactions  . Prednisone Other (See Comments)    Hallucinations October 2014  . Hydrocodone-Acetaminophen Other (See Comments)    agitation  . Oxycodone Itching and Other (See Comments)    Hallucinating, sweating, itching Pt states she can take this in small doses   Medications: See med rec.  Review of Systems: No fevers, chills, night sweats, weight loss, chest pain, or shortness of breath.   Objective:    General: Speaking full sentences, no audible heavy breathing.  Sounds alert and appropriately interactive.  No other physical exam performed due to the non-face to face nature of this visit.  Impression and Recommendations:    Bronchitis, mucopurulent recurrent (HCC) No true COPD, just recurrent episodes of bronchitis, she likely has some degree of asthma, she does get occasional cough, and asthma would not present on a regular spirometry test if asymptomatic. She does do Breo consistently, we [place] going [place], and add a 5-day burst of prednisone. Return to see me in person if no better in 1 week.  I discussed the above assessment and treatment plan with the patient. The patient was provided an opportunity to ask questions and all were answered. The patient agreed with the plan and demonstrated an understanding of the instructions.   The patient was advised to call back or seek an in-person evaluation if the symptoms worsen or if the condition fails to improve as anticipated.   I provided 25 minutes of non-face-to-face time during this encounter, 15 minutes of additional time was needed to gather information, review chart, records,  communicate/coordinate with staff remotely, and complete documentation.   ___________________________________________ Ihor Austinhomas J. Benjamin Stainhekkekandam, M.D., ABFM., CAQSM. Primary Care and Sports Medicine [facility]  Adjunct Professor of Family Medicine  [employer]

## 2018-05-22 ENCOUNTER — Ambulatory Visit (INDEPENDENT_AMBULATORY_CARE_PROVIDER_SITE_OTHER): Payer: BLUE CROSS/BLUE SHIELD | Admitting: Psychiatry

## 2018-05-22 ENCOUNTER — Encounter (HOSPITAL_COMMUNITY): Payer: Self-pay | Admitting: Psychiatry

## 2018-05-22 DIAGNOSIS — M797 Fibromyalgia: Secondary | ICD-10-CM

## 2018-05-22 DIAGNOSIS — F5102 Adjustment insomnia: Secondary | ICD-10-CM | POA: Diagnosis not present

## 2018-05-22 DIAGNOSIS — F332 Major depressive disorder, recurrent severe without psychotic features: Secondary | ICD-10-CM | POA: Diagnosis not present

## 2018-05-22 DIAGNOSIS — F411 Generalized anxiety disorder: Secondary | ICD-10-CM | POA: Diagnosis not present

## 2018-05-22 MED ORDER — TRAZODONE HCL 100 MG PO TABS: ORAL_TABLET | ORAL | 0 refills | Status: DC

## 2018-05-22 MED ORDER — LAMOTRIGINE 200 MG PO TABS: 200.0000 mg | ORAL_TABLET | Freq: Every day | ORAL | 0 refills | Status: DC

## 2018-05-22 NOTE — Progress Notes (Signed)
Patient ID: Jennifer [name], female   DOB: 1960/08/23, 58 y.o.   MRN: [id number]   [facility] Follow-up Outpatient Visit Telepsych visit Jennifer [name] 02/16/1960  Date: 05/22/2018 Chief complaint : follow up,  Depression and insomnia HPI: I connected with Jennifer [name] on 05/22/18 at  4:00 PM EDT by telephone and verified that I am speaking with the correct person using two identifiers.   I discussed the limitations, risks, security and privacy concerns of performing an evaluation and management service by telephone and the availability of in person appointments. I also discussed with the patient that there may be a patient responsible charge related to this service. The patient expressed understanding and agreed to proceed.HPI :  Jennifer [name] is a 58 year old female with a diagnosis of major Depressive Disorder. 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   She has made this earlier appointment for insomnia, says cannot maintain sleep, has restless leges and takes gabapentin . Also on cymbalta for fibromyalgia  Feels meds not strong for pain and it effects her mood and sleep  Cant sleep without trazadone we talked about sleep hygiene and can increase trazadone but still feels wakes ups in the middle night .   husband is supportive Denies flushing.  Not depressed or suicidal, subdued at times stays at home Primary care has recently started a new med for copd.     daughter is supportive   . Severity: Depression: 6/10 10 being no depression Anxiety- fluctuates     Review of Systems  Cardiovascular: Negative for chest pain.  Gastrointestinal: Negative for nausea.  Musculoskeletal: Positive for myalgias.  Skin: Negative for itching.  Neurological: Negative for tremors and headaches.  Psychiatric/Behavioral: Negative for substance abuse and suicidal ideas. The patient has insomnia.      Physical Exam  Vitals reviewed.  Constitutional: She appears  well-developed and well-nourished. No distress.  Skin: She is not diaphoretic.     Past Medical History: Reviewed.  Past Medical History   Diagnosis  Date   .  Discoid lupus    .  Thyroid disease    .  Perimenopausal    .  Fibromyalgia    .  CFS (chronic fatigue syndrome)    .  Sleep apnea      Allergies: Reviewed.  Allergies   Allergen  Reactions   .  Acetaminophen      Pt states she is unable to take this bc of Hashimoto Thyroid   .  Hydrocodone-Acetaminophen      REACTION: agitation   .  Oxycodone      Hallucinating, sweating, itching    Current Medications: Reviewed.  Current Outpatient Medications on File Prior to Visit  Medication Sig Dispense Refill  . albuterol (PROVENTIL HFA) 108 (90 Base) MCG/ACT inhaler Inhale 1-2 puffs into the lungs every 4 (four) hours as needed for wheezing or shortness of breath. 1 Inhaler 3  . atorvastatin (LIPITOR) 40 MG tablet Take 1 tablet (40 mg total) by mouth daily at 6 PM. 90 tablet 3  . azelastine (OPTIVAR) 0.05 % ophthalmic solution Place 1 drop into both eyes 2 (two) times daily as needed (itchy/watery eyes). 6 mL 0  . DULoxetine (CYMBALTA) 30 MG capsule Take 1 capsule (30 mg total) by mouth daily. 30 capsule 3  . fluticasone (FLONASE) 50 MCG/ACT nasal spray One spray in each nostril twice a day, use left hand for right nostril, and right hand for left nostril. 48 g 3  .  Fluticasone-Umeclidin-Vilant (TRELEGY ELLIPTA) 100-62.5-25 MCG/INH AEPB Inhale 1 puff into the lungs daily. 1 each 11  . gabapentin (NEURONTIN) 800 MG tablet TAKE 2 TABLETS(1600 MG) BY MOUTH TWICE DAILY 120 tablet 0  . levothyroxine (SYNTHROID, LEVOTHROID) 150 MCG tablet TAKE 1 TABLET BY MOUTH DAILY BEFORE BREAKFAST 30 tablet 3  . methotrexate (RHEUMATREX) 7.5 MG tablet Take 1 tablet (7.5 mg total) by mouth once a week. Caution" Chemotherapy. Protect from light. 4 tablet 1  . Minoxidil 5 % FOAM Apply 1 application topically 2 (two) times daily. Apply twice daily for up  to 4 months 1 Can 11  . mirabegron ER (MYRBETRIQ) 50 MG TB24 tablet Take 1 tablet (50 mg total) by mouth daily. 30 tablet 3  . montelukast (SINGULAIR) 10 MG tablet Take 1 tablet (10 mg total) by mouth daily. 90 tablet 3  . omega-3 acid ethyl esters (LOVAZA) 1 g capsule Take 2 capsules (2 g total) by mouth 2 (two) times daily. 360 capsule 3  . PARoxetine (PAXIL) 10 MG tablet 3 tabs daily for 3 days then 2 tabs daily for 3 days then 1 tab daily for 3 days then stop 20 tablet 0  . predniSONE (DELTASONE) 50 MG tablet Take 1 tablet (50 mg total) by mouth daily. 5 tablet 0  . tiZANidine (ZANAFLEX) 4 MG tablet     . triamcinolone cream (KENALOG) 0.5 % Apply 1 application topically 2 (two) times daily. To affected areas. 30 g 3   No current facility-administered medications on file prior to visit.        Family History: Reviewed.  Family History   Problem  Relation  Age of Onset   .  Hypertension  Father    .  Heart disease  Sister  45      heart attack    .  Depression  Brother    .  Cancer  Mother  43      colon    Psychiatric Specialty Exam:     Speech:  Clear and Coherent and Normal Rate  Volume:  Normal  Mood:somewhat subdued  Affect: congruent  Thought Process:  clear  Orientation:  Full (Time, Place, and Person)  Thought Content:  WDL  Suicidal Thoughts:  No  Homicidal Thoughts:  No  Memory:  fair  Judgement:  Good  Insight:  Fair  Psychomotor Activity:  decreased  Concentration:  Fair  Recall:  Poor  Akathisia:  No  Handed:  Right  Fund of knowledge-average to above average  Language-Intact  AIMS (if indicated):     Assets:  Manufacturing systems engineer Desire for Improvement Financial Resources/Insurance Housing Intimacy Leisure Time Physical Health Resilience Social Support Talents/Skills Transportation Vocational/Educational  Language intact   Fund of knowledge is good      Assessment:  Major Depression, Recurrent severe  Alcohol Dependence in full  sustained remission-stable AXIS I  Major Depression, Recurrent severe, Alcohol Dependence in full sustained remission . Fibromyalgia.     Treatment Plan/Recommendations:   Major depression;subdued but not worse. Continue cymbalta and lamictal  Can take trazadone 2 and half. Avoid if any signs of flushing or headaches. Says this dose has helped before but cant sleep since dose lowered, will refer to sleep medicine to rule out periodic limb movments or sleep apnea contributing to sleep issues.  Also has restless legs Pain remains  A concern effecting mood, follows with providers  Anxiety disorder; at times more. Continue cymbalta   Fibromyalgia; some more worse relavant to pain effecting  it Refills sent, reviewed sleep hygiene in detail   I discussed the assessment and treatment plan with the patient. The patient was provided an opportunity to ask questions and all were answered. The patient agreed with the plan and demonstrated an understanding of the instructions.   The patient was advised to call back or seek an in-person evaluation if the symptoms worsen or if the condition fails to improve as anticipated. Fu [id number]. Or earlier if needed  I provided 15 minutes of non-face-to-face time during this encounter. Thresa RossNadeem Cayleen Benjamin, MD

## 2018-05-25 ENCOUNTER — Ambulatory Visit (INDEPENDENT_AMBULATORY_CARE_PROVIDER_SITE_OTHER): Payer: BLUE CROSS/BLUE SHIELD

## 2018-05-25 ENCOUNTER — Other Ambulatory Visit: Payer: Self-pay

## 2018-05-25 ENCOUNTER — Ambulatory Visit (INDEPENDENT_AMBULATORY_CARE_PROVIDER_SITE_OTHER): Payer: BLUE CROSS/BLUE SHIELD | Admitting: Sports Medicine

## 2018-05-25 ENCOUNTER — Encounter: Payer: Self-pay | Admitting: Sports Medicine

## 2018-05-25 DIAGNOSIS — J411 Mucopurulent chronic bronchitis: Secondary | ICD-10-CM

## 2018-05-25 DIAGNOSIS — R079 Chest pain, unspecified: Secondary | ICD-10-CM | POA: Diagnosis not present

## 2018-05-25 DIAGNOSIS — R05 Cough: Secondary | ICD-10-CM | POA: Diagnosis not present

## 2018-05-25 NOTE — Progress Notes (Signed)
Subjective:    CC: Coughing  HPI: This is a pleasant 58 year old female, coming in to recheck her chronic cough, she has had normal spirometry x2, her symptoms do seem to resemble more of a chronic/recurrent bronchitis, and she has done well in the past with Breo.  At the last visit we gave her a burst of prednisone, and switch her to Trelegy.  She has noted good improvement in the Trelegy and only has a small amount of residual dry cough without shortness of breath.  She has a touch of pleuritic chest pain in the left upper chest, but overall feeling well.  She has not seen her pulmonologist in years.  No fevers, chills, no GI symptoms, no skin rash.  I reviewed the past medical history, family history, social history, surgical history, and allergies today and no changes were needed.  Please see the problem list section below in epic for further details.  Past Medical History: Past Medical History:  Diagnosis Date  . Alcohol abuse   . Asthmatic bronchitis 2013  . CFS (chronic fatigue syndrome)   . Discoid lupus   . Fibromyalgia   . GERD (gastroesophageal reflux disease) 2013  . Hyperlipidemia   . Postartificial menopausal syndrome   . Sleep apnea   . Thyroid disease    Past Surgical History: Past Surgical History:  Procedure Laterality Date  . ABDOMINAL HYSTERECTOMY  09/2007   TAH with bladder sling  . NASAL SEPTUM SURGERY    . orto surgery plate in leg    . TUBAL LIGATION     bilateral  . uvuloplasty     Social History: Social History   Socioeconomic History  . Marital status: Married    Spouse name: Not on file  . Number of children: 4  . Years of education: Not on file  . Highest education level: Not on file  Occupational History  . Occupation: homemaker    Employer: UNEMPLOYED  Social Needs  . Financial resource strain: Not on file  . Food insecurity:    Worry: Not on file    Inability: Not on file  . Transportation needs:    Medical: Not on file   Non-medical: Not on file  Tobacco Use  . Smoking status: Former Smoker    Packs/day: 0.30    Years: 36.00    Pack years: 10.80    Types: Cigarettes  . Smokeless tobacco: Never Used  . Tobacco comment: Started smoking at age 27.  Substance and Sexual Activity  . Alcohol use: No    Comment: quit 01-2006/prior alcoholic-in AA  . Drug use: No  . Sexual activity: Never  Lifestyle  . Physical activity:    Days per week: Not on file    Minutes per session: Not on file  . Stress: Not on file  Relationships  . Social connections:    Talks on phone: Not on file    Gets together: Not on file    Attends religious service: Not on file    Active member of club or organization: Not on file    Attends meetings of clubs or organizations: Not on file    Relationship status: Not on file  Other Topics Concern  . Not on file  Social History Narrative  . Not on file   Family History: Family History  Problem Relation Age of Onset  . Hypertension Father   . Alcohol abuse Father   . Heart disease Sister 71       heart  attack  . Heart attack Sister   . Depression Brother   . Alcohol abuse Brother        In remission  . Cancer Mother [phone]       colon  . Depression Brother   . Alcohol abuse Maternal Aunt   . Alcohol abuse Paternal Uncle        in remission  . Bipolar disorder Daughter [phone]       Suicide attempt   Allergies: Allergies  Allergen Reactions  . Prednisone Other (See Comments)    Hallucinations October 2014  . Hydrocodone-Acetaminophen Other (See Comments)    agitation  . Oxycodone Itching and Other (See Comments)    Hallucinating, sweating, itching Pt states she can take this in small doses   Medications: See med rec.  Review of Systems: No fevers, chills, night sweats, weight loss, chest pain, or shortness of breath.   Objective:    General: Well Developed, well nourished, and in no acute distress.  Neuro: Alert and oriented x3, extra-ocular muscles intact,  sensation grossly intact.  HEENT: Normocephalic, atraumatic, pupils equal round reactive to light, neck supple, no masses, no lymphadenopathy, thyroid nonpalpable.  Skin: Warm and dry, no rashes. Cardiac: Regular rate and rhythm, no murmurs rubs or gallops, no lower extremity edema.  Respiratory: Clear to auscultation bilaterally. Not using accessory muscles, speaking in full sentences.  Impression and Recommendations:    Bronchitis, mucopurulent recurrent (HCC) Has had normal spirometry several times. Persistent chronic cough, likely chronic bronchitis. Improved on Trelegy, prednisone. Still has some pain in the left upper chest and a dry cough present for several months now. Adding a chest x-ray, labs including a d-dimer. I would like a second opinion from pulmonology again as to whether this could simply represent chronic upper airway cough syndrome.   ___________________________________________ Ihor Austinhomas J. Benjamin Stainhekkekandam, M.D., ABFM., CAQSM. Primary Care and Sports Medicine [place] [facility]  Adjunct Professor of Family Medicine  [employer]

## 2018-05-25 NOTE — Assessment & Plan Note (Signed)
Has had normal spirometry several times. Persistent chronic cough, likely chronic bronchitis. Improved on Trelegy, prednisone. Still has some pain in the left upper chest and a dry cough present for several months now. Adding a chest x-ray, labs including a d-dimer. I would like a second opinion from pulmonology again as to whether this could simply represent chronic upper airway cough syndrome.

## 2018-05-26 LAB — CBC
HCT: 41.6 % (ref 35.0–45.0)
Hemoglobin: 14.2 g/dL (ref 11.7–15.5)
MCH: 31.1 pg (ref 27.0–33.0)
MCHC: 34.1 g/dL (ref 32.0–36.0)
MCV: 91 fL (ref 80.0–100.0)
MPV: 9.8 fL (ref 7.5–12.5)
Platelets: 191 10*3/uL (ref 140–400)
RBC: 4.57 10*6/uL (ref 3.80–5.10)
RDW: 13.5 % (ref 11.0–15.0)
WBC: 8.3 10*3/uL (ref 3.8–10.8)

## 2018-05-26 LAB — COMPLETE METABOLIC PANEL WITH GFR
AG Ratio: 2 (calc) (ref 1.0–2.5)
ALT: 31 U/L — ABNORMAL HIGH (ref 6–29)
AST: 18 U/L (ref 10–35)
Albumin: 4.2 g/dL (ref 3.6–5.1)
Alkaline phosphatase (APISO): 48 U/L (ref 37–153)
BUN/Creatinine Ratio: 13 (calc) (ref 6–22)
BUN: 14 mg/dL (ref 7–25)
CO2: 31 mmol/L (ref 20–32)
Calcium: 9.4 mg/dL (ref 8.6–10.4)
Chloride: 102 mmol/L (ref 98–110)
Creat: 1.07 mg/dL — ABNORMAL HIGH (ref 0.50–0.99)
GFR, Est African American: 65 mL/min/{1.73_m2} (ref >=60)
GFR, Est Non African American: 56 mL/min/{1.73_m2} — ABNORMAL LOW (ref >=60)
Globulin: 2.1 g/dL (calc) (ref 1.9–3.7)
Glucose, Bld: 117 mg/dL — ABNORMAL HIGH (ref 65–99)
Potassium: 4.1 mmol/L (ref 3.5–5.3)
Sodium: 140 mmol/L (ref 135–146)
Total Bilirubin: 0.7 mg/dL (ref 0.2–1.2)
Total Protein: 6.3 g/dL (ref 6.1–8.1)

## 2018-05-26 LAB — D-DIMER, QUANTITATIVE: D-Dimer, Quant: 0.21 mcg/mL FEU (ref <0.50)

## 2018-05-30 ENCOUNTER — Ambulatory Visit: Payer: BLUE CROSS/BLUE SHIELD

## 2018-06-07 DIAGNOSIS — G894 Chronic pain syndrome: Secondary | ICD-10-CM | POA: Diagnosis not present

## 2018-06-07 DIAGNOSIS — M797 Fibromyalgia: Secondary | ICD-10-CM | POA: Diagnosis not present

## 2018-06-11 ENCOUNTER — Other Ambulatory Visit: Payer: Self-pay | Admitting: Sports Medicine

## 2018-06-11 DIAGNOSIS — M797 Fibromyalgia: Secondary | ICD-10-CM

## 2018-06-12 ENCOUNTER — Encounter: Payer: Self-pay | Admitting: Sports Medicine

## 2018-06-12 DIAGNOSIS — M797 Fibromyalgia: Secondary | ICD-10-CM

## 2018-06-12 MED ORDER — GABAPENTIN 800 MG PO TABS: 1600.0000 mg | ORAL_TABLET | Freq: Two times a day (BID) | ORAL | 1 refills | Status: DC

## 2018-07-03 DIAGNOSIS — M797 Fibromyalgia: Secondary | ICD-10-CM | POA: Diagnosis not present

## 2018-07-03 DIAGNOSIS — G894 Chronic pain syndrome: Secondary | ICD-10-CM | POA: Diagnosis not present

## 2018-07-13 ENCOUNTER — Encounter: Payer: Self-pay | Admitting: Sports Medicine

## 2018-07-13 ENCOUNTER — Telehealth (INDEPENDENT_AMBULATORY_CARE_PROVIDER_SITE_OTHER): Payer: BC Managed Care – PPO | Admitting: Sports Medicine

## 2018-07-13 DIAGNOSIS — F332 Major depressive disorder, recurrent severe without psychotic features: Secondary | ICD-10-CM

## 2018-07-13 MED ORDER — PAROXETINE HCL 20 MG PO TABS: ORAL_TABLET | ORAL | 3 refills | Status: DC

## 2018-07-13 NOTE — Assessment & Plan Note (Signed)
Jennifer [name] desires to go back to Paxil, she feels as though it worked better, and it was cheaper. She is only on low-dose Cymbalta, switching back to Paxil. She was on 40 mg of Paxil, we are going to start with 10 mg daily for a week then 20 mg tablet daily. We can up taper if needed. Return as needed for this.

## 2018-07-13 NOTE — Progress Notes (Signed)
Virtual Visit via WebEx/MyChart   I connected with  Jennifer SchleinColleen M [name]  on 07/13/18 via WebEx/MyChart/Doximity Video and verified that I am speaking with the correct person using two identifiers.   I discussed the limitations, risks, security and privacy concerns of performing an evaluation and management service by WebEx/MyChart/Doximity Video, including the higher likelihood of inaccurate diagnosis and treatment, and the availability of in person appointments.  We also discussed the likely need of an additional face to face encounter for complete and high quality delivery of care.  I also discussed with the patient that there may be a patient responsible charge related to this service. The patient expressed understanding and wishes to proceed.  Provider location is either at home or medical facility. Patient location is at their home, different from provider location. People involved in care of the patient during this telehealth encounter were myself, my nurse/medical assistant, and my front office/scheduling team member.  Subjective:    CC: Desires to change antidepressant  HPI: Jennifer SideColleen desires to go back to Paxil, she feels as though it worked better, and it was cheaper.  No suicidal or homicidal ideation  I reviewed the past medical history, family history, social history, surgical history, and allergies today and no changes were needed.  Please see the problem list section below in epic for further details.  Past Medical History: Past Medical History:  Diagnosis Date  . Alcohol abuse   . Asthmatic bronchitis 2013  . CFS (chronic fatigue syndrome)   . Discoid lupus   . Fibromyalgia   . GERD (gastroesophageal reflux disease) 2013  . Hyperlipidemia   . Postartificial menopausal syndrome   . Sleep apnea   . Thyroid disease    Past Surgical History: Past Surgical History:  Procedure Laterality Date  . ABDOMINAL HYSTERECTOMY  09/2007   TAH with bladder sling  . NASAL SEPTUM  SURGERY    . orto surgery plate in leg    . TUBAL LIGATION     bilateral  . uvuloplasty     Social History: Social History   Socioeconomic History  . Marital status: Married    Spouse name: Not on file  . Number of children: 4  . Years of education: Not on file  . Highest education level: Not on file  Occupational History  . Occupation: homemaker    Employer: UNEMPLOYED  Social Needs  . Financial resource strain: Not on file  . Food insecurity    Worry: Not on file    Inability: Not on file  . Transportation needs    Medical: Not on file    Non-medical: Not on file  Tobacco Use  . Smoking status: Former Smoker    Packs/day: 0.30    Years: 36.00    Pack years: 10.80    Types: Cigarettes  . Smokeless tobacco: Never Used  . Tobacco comment: Started smoking at age 58.  Substance and Sexual Activity  . Alcohol use: No    Comment: quit 01-2006/prior alcoholic-in AA  . Drug use: No  . Sexual activity: Never  Lifestyle  . Physical activity    Days per week: Not on file    Minutes per session: Not on file  . Stress: Not on file  Relationships  . Social Musicianconnections    Talks on phone: Not on file    Gets together: Not on file    Attends religious service: Not on file    Active member of club or organization: Not on file  Attends meetings of clubs or organizations: Not on file    Relationship status: Not on file  Other Topics Concern  . Not on file  Social History Narrative  . Not on file   Family History: Family History  Problem Relation Age of Onset  . Hypertension Father   . Alcohol abuse Father   . Heart disease Sister 31       heart attack  . Heart attack Sister   . Depression Brother   . Alcohol abuse Brother        In remission  . Cancer Mother 14       colon  . Depression Brother   . Alcohol abuse Maternal Aunt   . Alcohol abuse Paternal Uncle        in remission  . Bipolar disorder Daughter 76       Suicide attempt   Allergies: Allergies   Allergen Reactions  . Prednisone Other (See Comments)    Hallucinations October 2014  . Hydrocodone-Acetaminophen Other (See Comments)    agitation  . Oxycodone Itching and Other (See Comments)    Hallucinating, sweating, itching Pt states she can take this in small doses   Medications: See med rec.  Review of Systems: No fevers, chills, night sweats, weight loss, chest pain, or shortness of breath.   Objective:    General: Speaking full sentences, no audible heavy breathing.  Sounds alert and appropriately interactive.  Appears well.  Face symmetric.  Extraocular movements intact.  Pupils equal and round.  No nasal flaring or accessory muscle use visualized.  No other physical exam performed due to the non-physical nature of this visit.  Impression and Recommendations:    Major depressive disorder, recurrent episode, severe Jennifer [name] desires to go back to Paxil, she feels as though it worked better, and it was cheaper. She is only on low-dose Cymbalta, switching back to Paxil. She was on 40 mg of Paxil, we are going to start with 10 mg daily for a week then 20 mg tablet daily. We can up taper if needed. Return as needed for this.  I discussed the above assessment and treatment plan with the patient. The patient was provided an opportunity to ask questions and all were answered. The patient agreed with the plan and demonstrated an understanding of the instructions.   The patient was advised to call back or seek an in-person evaluation if the symptoms worsen or if the condition fails to improve as anticipated.   I provided 25 minutes of non-face-to-face time during this encounter, 15 minutes of additional time was needed to gather information, review chart, records, communicate/coordinate with staff remotely, troubleshooting the multiple errors that we get every time when trying to do video calls through the electronic medical record, WebEx, and Doximity, restart the encounter multiple  times due to instability of the software, as well as complete documentation.   ___________________________________________ Gwen Her. Dianah Field, M.D., ABFM., CAQSM. Primary Care and Sports Medicine [facility]  Adjunct Professor of [employer]

## 2018-08-02 DIAGNOSIS — L821 Other seborrheic keratosis: Secondary | ICD-10-CM | POA: Diagnosis not present

## 2018-08-02 DIAGNOSIS — L930 Discoid lupus erythematosus: Secondary | ICD-10-CM | POA: Diagnosis not present

## 2018-08-02 DIAGNOSIS — L300 Nummular dermatitis: Secondary | ICD-10-CM | POA: Diagnosis not present

## 2018-08-02 DIAGNOSIS — L820 Inflamed seborrheic keratosis: Secondary | ICD-10-CM | POA: Diagnosis not present

## 2018-08-21 ENCOUNTER — Ambulatory Visit (INDEPENDENT_AMBULATORY_CARE_PROVIDER_SITE_OTHER): Payer: BC Managed Care – PPO | Admitting: Psychiatry

## 2018-08-21 ENCOUNTER — Encounter (HOSPITAL_COMMUNITY): Payer: Self-pay | Admitting: Psychiatry

## 2018-08-21 DIAGNOSIS — M797 Fibromyalgia: Secondary | ICD-10-CM

## 2018-08-21 DIAGNOSIS — F332 Major depressive disorder, recurrent severe without psychotic features: Secondary | ICD-10-CM

## 2018-08-21 DIAGNOSIS — F411 Generalized anxiety disorder: Secondary | ICD-10-CM

## 2018-08-21 MED ORDER — TRAZODONE HCL 100 MG PO TABS: ORAL_TABLET | ORAL | 0 refills | Status: DC

## 2018-08-21 MED ORDER — LAMOTRIGINE 200 MG PO TABS: 200.0000 mg | ORAL_TABLET | Freq: Every day | ORAL | 0 refills | Status: DC

## 2018-08-21 NOTE — Progress Notes (Signed)
Patient ID: Jennifer [name] Kinley, female   DOB: 12/06/1960, 58 y.o.   MRN: [id number]   [facility] Telepsych visit Jennifer [name] [place] 07/25/1960  Date: 05/22/2018 Chief complaint : follow up,  Depression and insomnia HPI:  I connected with Jennifer Schleinolleen [name] Moroz on 08/21/18 at  3:00 PM EDT by telephone and verified that I am speaking with the correct person using two identifiers. I discussed the limitations, risks, security and privacy concerns of performing an evaluation and management service by telephone and the availability of in person appointments. I also discussed with the patient that there may be a patient responsible charge related to this service. The patient expressed understanding and agreed to proceed.HPI :  Ms. [name] is a 58 year old female with a diagnosis of major Depressive Disorder. 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   Doing fair, cymbalta changed to paxil, she has used before On gabapantin for pain, fibro and restless legs   trazadone helps sleep No flushing or side effects  husband is supportive   Not depressed or suicidal, subdued at times stays at home   daughter is supportive   . Severity: Depression: 6/10 10 being no depression Anxiety- fluctuates     Review of Systems  Cardiovascular: Negative for chest pain.  Gastrointestinal: Negative for nausea.  Musculoskeletal: Positive for myalgias.  Skin: Negative for itching.  Neurological: Negative for headaches.  Psychiatric/Behavioral: Negative for substance abuse and suicidal ideas. The patient has insomnia.      Physical Exam  Vitals reviewed.  Constitutional: She appears well-developed and well-nourished. No distress.  Skin: She is not diaphoretic.     Past Medical History: Reviewed.  Past Medical History   Diagnosis  Date   .  Discoid lupus    .  Thyroid disease    .  Perimenopausal    .  Fibromyalgia    .  CFS (chronic fatigue syndrome)     .  Sleep apnea      Allergies: Reviewed.  Allergies   Allergen  Reactions   .  Acetaminophen      Pt states she is unable to take this bc of Hashimoto Thyroid   .  Hydrocodone-Acetaminophen      REACTION: agitation   .  Oxycodone      Hallucinating, sweating, itching    Current Medications: Reviewed.  Current Outpatient Medications on File Prior to Visit  Medication Sig Dispense Refill  . albuterol (PROVENTIL HFA) 108 (90 Base) MCG/ACT inhaler Inhale 1-2 puffs into the lungs every 4 (four) hours as needed for wheezing or shortness of breath. 1 Inhaler 3  . atorvastatin (LIPITOR) 40 MG tablet Take 1 tablet (40 mg total) by mouth daily at 6 PM. 90 tablet 3  . azelastine (OPTIVAR) 0.05 % ophthalmic solution Place 1 drop into both eyes 2 (two) times daily as needed (itchy/watery eyes). 6 mL 0  . fluticasone (FLONASE) 50 MCG/ACT nasal spray One spray in each nostril twice a day, use left hand for right nostril, and right hand for left nostril. 48 g 3  . Fluticasone-Umeclidin-Vilant (TRELEGY ELLIPTA) 100-62.5-25 MCG/INH AEPB Inhale 1 puff into the lungs daily. 1 each 11  . gabapentin (NEURONTIN) 800 MG tablet Take 2 tablets (1,600 mg total) by mouth 2 (two) times daily. 360 tablet 1  . levothyroxine (SYNTHROID, LEVOTHROID) 150 MCG tablet TAKE 1 TABLET BY MOUTH DAILY BEFORE BREAKFAST 30 tablet 3  . methotrexate (RHEUMATREX) 7.5 MG tablet Take 1 tablet (7.5 mg  total) by mouth once a week. Caution" Chemotherapy. Protect from light. 4 tablet 1  . Minoxidil 5 % FOAM Apply 1 application topically 2 (two) times daily. Apply twice daily for up to 4 months 1 Can 11  . montelukast (SINGULAIR) 10 MG tablet Take 1 tablet (10 mg total) by mouth daily. 90 tablet 3  . PARoxetine (PAXIL) 20 MG tablet 1/2 tablet daily for 1 week then 1 tab p.o. daily 30 tablet 3  . tiZANidine (ZANAFLEX) 4 MG tablet      No current facility-administered medications on file prior to visit.        Family History:  Reviewed.  Family History   Problem  Relation  Age of Onset   .  Hypertension  Father    .  Heart disease  Sister  6      heart attack    .  Depression  Brother    .  Cancer  Mother  40      colon    Psychiatric Specialty Exam:     Speech:  Clear and Coherent and Normal Rate  Volume:  Normal  Mood: fair  Affect: congruent  Thought Process:  clear  Orientation:  Full (Time, Place, and Person)  Thought Content:  WDL  Suicidal Thoughts:  No  Homicidal Thoughts:  No  Memory:  fair  Judgement:  Good  Insight:  Fair  Psychomotor Activity:  decreased  Concentration:  Fair  Recall:  Poor  Akathisia:  No  Handed:  Right  Fund of knowledge-average to above average  Language-Intact  AIMS (if indicated):     Assets:  [employer] logistics/support/administrative officer Desire for Improvement Financial Resources/Insurance [place] Talents/Skills Transportation [place] of knowledge is good      Assessment:  Major Depression, Recurrent severe  Alcohol Dependence in full sustained remission-stable AXIS I  Major Depression, Recurrent severe, Alcohol Dependence in full sustained remission . Fibromyalgia.     Treatment Plan/Recommendations:   Major depression; doing fair, she will continue paxil, lamictal  Also has restless legs. Says gaba helps Pain remains  A concern effecting mood, follows with providers  Anxiety disorder; fluctuates, continue paxil Fibromyalgia; some more worse relavant to pain effecting it Refills sent, reviewed sleep hygiene in detail   I discussed the assessment and treatment plan with the patient. The patient was provided an opportunity to ask questions and all were answered. The patient agreed with the plan and demonstrated an understanding of the instructions.   The patient was advised to call back or seek an in-person evaluation if the symptoms worsen or if the  condition fails to improve as anticipated. Wants to come in 84m. For insurance reason can call back earlier for conerns   I provided 15 minutes of non-face-to-face time during this encounter. Merian Capron, MD

## 2018-08-27 ENCOUNTER — Ambulatory Visit (INDEPENDENT_AMBULATORY_CARE_PROVIDER_SITE_OTHER): Payer: BC Managed Care – PPO | Admitting: Sports Medicine

## 2018-08-27 ENCOUNTER — Telehealth: Payer: Self-pay | Admitting: Sports Medicine

## 2018-08-27 ENCOUNTER — Encounter: Payer: Self-pay | Admitting: Sports Medicine

## 2018-08-27 DIAGNOSIS — Z299 Encounter for prophylactic measures, unspecified: Secondary | ICD-10-CM

## 2018-08-27 DIAGNOSIS — L930 Discoid lupus erythematosus: Secondary | ICD-10-CM | POA: Diagnosis not present

## 2018-08-27 DIAGNOSIS — J411 Mucopurulent chronic bronchitis: Secondary | ICD-10-CM

## 2018-08-27 NOTE — Assessment & Plan Note (Signed)
With hair loss, she has seen rheumatology and dermatology in the past, methotrexate 7.5 mg weekly seemed efficacious. Hydroxychloroquine was only minimally efficacious. I asked her to touch base again with dermatology for this, as I do not feel comfortable prescribing methotrexate.

## 2018-08-27 NOTE — Telephone Encounter (Signed)
FYI--  Patient is losing insurance soon and she had a visit with PCP today. He cleared her to come in the office tomorrow for a flu shot.   I have placed stickers on the Trelegy that is in the back cabinet. She will get this tomorrow at her visit for the flu shot. Can you give them to her please?   Lot:[id number] Expiration: 10/03/2019 NDC: 5686-1683-72

## 2018-08-27 NOTE — Assessment & Plan Note (Signed)
Normal spirometry several times, persistent cough, likely some degree of chronic bronchitis, improved with Trelegy, occasional prednisone. She continues to have a dry cough, chest x-ray, D-dimers were negative. I did want a second opinion from pulmonology but she forgot to go, I am placing a new referral. I think there is a good chance this is simply chronic upper airway cough syndrome.

## 2018-08-27 NOTE — Progress Notes (Signed)
Virtual Visit via WebEx/MyChart   I connected with  Jennifer SchleinColleen M [name]  on 08/27/18 via WebEx/MyChart/Doximity Video and verified that I am speaking with the correct person using two identifiers.   I discussed the limitations, risks, security and privacy concerns of performing an evaluation and management service by WebEx/MyChart/Doximity Video, including the higher likelihood of inaccurate diagnosis and treatment, and the availability of in person appointments.  We also discussed the likely need of an additional face to face encounter for complete and high quality delivery of care.  I also discussed with the patient that there may be a patient responsible charge related to this service. The patient expressed understanding and wishes to proceed.  Provider location is either at home or medical facility. Patient location is at their home, different from provider location. People involved in care of the patient during this telehealth encounter were myself, my nurse/medical assistant, and my front office/scheduling team member.  Subjective:    CC: Several issues  HPI: See below for details.  I reviewed the past medical history, family history, social history, surgical history, and allergies today and no changes were needed.  Please see the problem list section below in epic for further details.  Past Medical History: Past Medical History:  Diagnosis Date  . Alcohol abuse   . Asthmatic bronchitis 2013  . CFS (chronic fatigue syndrome)   . Discoid lupus   . Fibromyalgia   . GERD (gastroesophageal reflux disease) 2013  . Hyperlipidemia   . Postartificial menopausal syndrome   . Sleep apnea   . Thyroid disease    Past Surgical History: Past Surgical History:  Procedure Laterality Date  . ABDOMINAL HYSTERECTOMY  09/2007   TAH with bladder sling  . NASAL SEPTUM SURGERY    . orto surgery plate in leg    . TUBAL LIGATION     bilateral  . uvuloplasty     Social History: Social History    Socioeconomic History  . Marital status: Married    Spouse name: Not on file  . Number of children: 4  . Years of education: Not on file  . Highest education level: Not on file  Occupational History  . Occupation: homemaker    Employer: UNEMPLOYED  Social Needs  . Financial resource strain: Not on file  . Food insecurity    Worry: Not on file    Inability: Not on file  . Transportation needs    Medical: Not on file    Non-medical: Not on file  Tobacco Use  . Smoking status: Former Smoker    Packs/day: 0.30    Years: 36.00    Pack years: 10.80    Types: Cigarettes  . Smokeless tobacco: Never Used  . Tobacco comment: Started smoking at age 58.  Substance and Sexual Activity  . Alcohol use: No    Comment: quit 01-2006/prior alcoholic-in AA  . Drug use: No  . Sexual activity: Never  Lifestyle  . Physical activity    Days per week: Not on file    Minutes per session: Not on file  . Stress: Not on file  Relationships  . Social Musicianconnections    Talks on phone: Not on file    Gets together: Not on file    Attends religious service: Not on file    Active member of club or organization: Not on file    Attends meetings of clubs or organizations: Not on file    Relationship status: Not on file  Other  Topics Concern  . Not on file  Social History Narrative  . Not on file   Family History: Family History  Problem Relation Age of Onset  . Hypertension Father   . Alcohol abuse Father   . Heart disease Sister 52       heart attack  . Heart attack Sister   . Depression Brother   . Alcohol abuse Brother        In remission  . Cancer Mother 57       colon  . Depression Brother   . Alcohol abuse Maternal Aunt   . Alcohol abuse Paternal Uncle        in remission  . Bipolar disorder Daughter 54       Suicide attempt   Allergies: Allergies  Allergen Reactions  . Prednisone Other (See Comments)    Hallucinations October 2014  . Hydrocodone-Acetaminophen Other (See  Comments)    agitation  . Oxycodone Itching and Other (See Comments)    Hallucinating, sweating, itching Pt states she can take this in small doses   Medications: See med rec.  Review of Systems: No fevers, chills, night sweats, weight loss, chest pain, or shortness of breath.   Objective:    General: Speaking full sentences, no audible heavy breathing.  Sounds alert and appropriately interactive.  Appears well.  Face symmetric.  Extraocular movements intact.  Pupils equal and round.  No nasal flaring or accessory muscle use visualized.  No other physical exam performed due to the non-physical nature of this visit.  Impression and Recommendations:    Bronchitis, mucopurulent recurrent (HCC) Normal spirometry several times, persistent cough, likely some degree of chronic bronchitis, improved with Trelegy, occasional prednisone. She continues to have a dry cough, chest x-ray, D-dimers were negative. I did want a second opinion from pulmonology but she forgot to go, I am placing a new referral. I think there is a good chance this is simply chronic upper airway cough syndrome.  Discoid lupus With hair loss, she has seen rheumatology and dermatology in the past, methotrexate 7.5 mg weekly seemed efficacious. Hydroxychloroquine was only minimally efficacious. I asked her to touch base again with dermatology for this, as I do not feel comfortable prescribing methotrexate.   Preventive measure She needs another mammogram, she is also cleared to make a nurse visit appointment for pneumococcal 23 and influenza vaccinations.   I discussed the above assessment and treatment plan with the patient. The patient was provided an opportunity to ask questions and all were answered. The patient agreed with the plan and demonstrated an understanding of the instructions.   The patient was advised to call back or seek an in-person evaluation if the symptoms worsen or if the condition fails to improve as  anticipated.   I provided 25 minutes of non-face-to-face time during this encounter, 15 minutes of additional time was needed to gather information, review chart, records, communicate/coordinate with staff remotely, troubleshooting the multiple errors that we get every time when trying to do video calls through the electronic medical record, WebEx, and Doximity, restart the encounter multiple times due to instability of the software, as well as complete documentation.   ___________________________________________ Gwen Her. Dianah Field, M.D., ABFM., CAQSM. Primary Care and Sports Medicine [facility]  Adjunct Professor of [employer]

## 2018-08-27 NOTE — Assessment & Plan Note (Signed)
She needs another mammogram, she is also cleared to make a nurse visit appointment for pneumococcal 23 and influenza vaccinations.

## 2018-08-28 ENCOUNTER — Encounter: Payer: Self-pay | Admitting: Sports Medicine

## 2018-08-28 ENCOUNTER — Other Ambulatory Visit: Payer: Self-pay

## 2018-08-28 ENCOUNTER — Ambulatory Visit: Payer: BC Managed Care – PPO | Admitting: Sports Medicine

## 2018-08-28 DIAGNOSIS — Z23 Encounter for immunization: Secondary | ICD-10-CM

## 2018-08-28 NOTE — Telephone Encounter (Signed)
Will give to patient when she comes in for her appointment. Aalaya Yadao,CMA

## 2018-08-28 NOTE — Progress Notes (Signed)
Pt here for flu shot. Afebrile,no recent illness. Vaccination given, pt tolerated well..Nelline Lio Lynetta, CMA  

## 2018-08-29 ENCOUNTER — Ambulatory Visit: Payer: BC Managed Care – PPO

## 2018-08-30 DIAGNOSIS — M797 Fibromyalgia: Secondary | ICD-10-CM | POA: Diagnosis not present

## 2018-08-30 DIAGNOSIS — G894 Chronic pain syndrome: Secondary | ICD-10-CM | POA: Diagnosis not present

## 2018-09-12 ENCOUNTER — Other Ambulatory Visit: Payer: Self-pay | Admitting: Sports Medicine

## 2018-09-12 DIAGNOSIS — E039 Hypothyroidism, unspecified: Secondary | ICD-10-CM

## 2018-10-23 DIAGNOSIS — G894 Chronic pain syndrome: Secondary | ICD-10-CM | POA: Diagnosis not present

## 2018-10-23 DIAGNOSIS — M797 Fibromyalgia: Secondary | ICD-10-CM | POA: Diagnosis not present

## 2018-11-14 ENCOUNTER — Other Ambulatory Visit (HOSPITAL_COMMUNITY): Payer: Self-pay

## 2018-11-14 DIAGNOSIS — F332 Major depressive disorder, recurrent severe without psychotic features: Secondary | ICD-10-CM

## 2018-11-14 MED ORDER — LAMOTRIGINE 200 MG PO TABS: 200.0000 mg | ORAL_TABLET | Freq: Every day | ORAL | 0 refills | Status: DC

## 2018-11-14 MED ORDER — TRAZODONE HCL 100 MG PO TABS: ORAL_TABLET | ORAL | 0 refills | Status: DC

## 2018-11-14 MED ORDER — PAROXETINE HCL 20 MG PO TABS: ORAL_TABLET | ORAL | 0 refills | Status: DC

## 2018-12-08 ENCOUNTER — Other Ambulatory Visit: Payer: Self-pay | Admitting: Sports Medicine

## 2018-12-08 DIAGNOSIS — E039 Hypothyroidism, unspecified: Secondary | ICD-10-CM

## 2018-12-08 DIAGNOSIS — E785 Hyperlipidemia, unspecified: Secondary | ICD-10-CM

## 2018-12-08 MED ORDER — ATORVASTATIN CALCIUM 40 MG PO TABS: 40.0000 mg | ORAL_TABLET | Freq: Every day | ORAL | 3 refills | Status: DC

## 2018-12-08 MED ORDER — LEVOTHYROXINE SODIUM 150 MCG PO TABS: 150.0000 ug | ORAL_TABLET | Freq: Every day | ORAL | 3 refills | Status: DC

## 2019-02-05 ENCOUNTER — Other Ambulatory Visit (HOSPITAL_COMMUNITY): Payer: Self-pay | Admitting: Psychiatry

## 2019-02-05 DIAGNOSIS — F332 Major depressive disorder, recurrent severe without psychotic features: Secondary | ICD-10-CM

## 2019-03-08 ENCOUNTER — Other Ambulatory Visit: Payer: Self-pay | Admitting: Sports Medicine

## 2019-03-08 DIAGNOSIS — E039 Hypothyroidism, unspecified: Secondary | ICD-10-CM

## 2019-03-20 IMAGING — XA Imaging study
2 series · 2 of 2 positions shown · non-contrast
Comparison: none

CLINICAL DATA: Lumbosacral spondylosis without myelopathy. Back and
RIGHT leg pain. Disc extrusion at L4-5, RIGHT.

[Series 1: ortho adipose · 1 of 1 slices shown (1 of 2)]
[im 1/1]
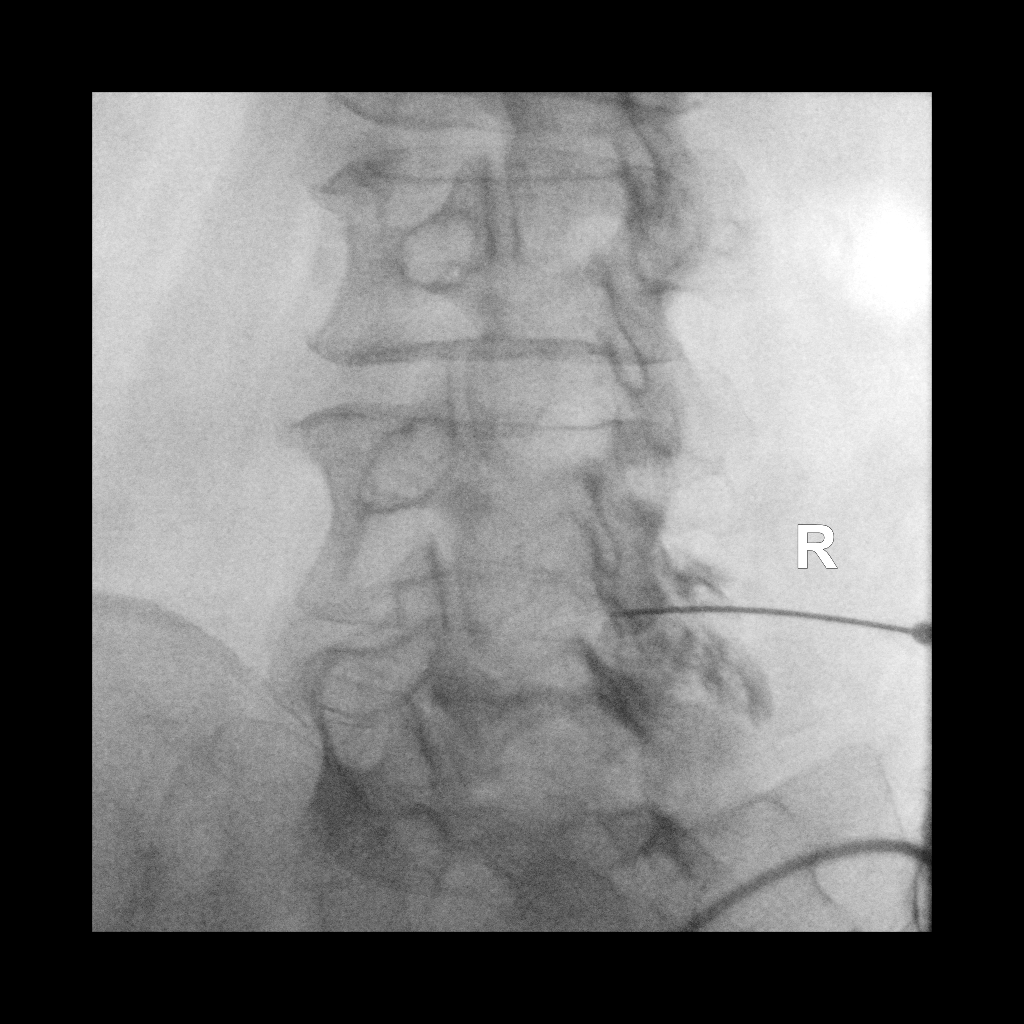

[Series 2: ortho adipose · 1 of 1 slices shown (2 of 2)]
[im 1/1]
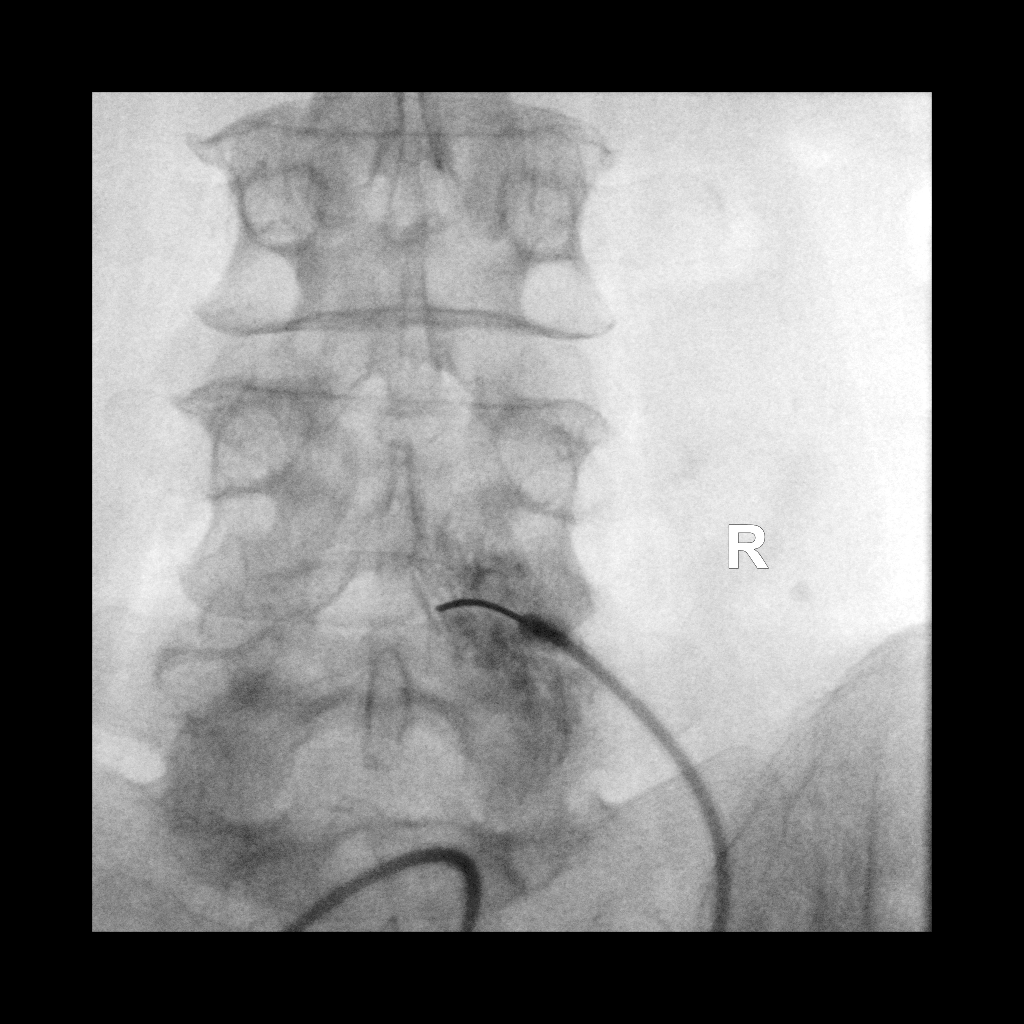

[2 of 2 positions shown; findings below may reference images not displayed]

FLUOROSCOPY TIME:  16 seconds corresponding to a Dose Area Product
of 32.56 Gy*m2

PROCEDURE:
The procedure, risks, benefits, and alternatives were explained to
the patient. Questions regarding the procedure were encouraged and
answered. The patient understands and consents to the procedure.

LUMBAR EPIDURAL INJECTION:

An interlaminar approach was performed on RIGHT at L4-5. The
overlying skin was cleansed and anesthetized. A 20 gauge epidural
needle was advanced using loss-of-resistance technique.

DIAGNOSTIC EPIDURAL INJECTION:

Injection of Isovue-M 200 initially showed extra-spinal spread,
further advancement shows a good epidural pattern with spread above
and below the level of needle placement, primarily on the RIGHT; no
vascular opacification is seen.

THERAPEUTIC EPIDURAL INJECTION:

120.0 mg of Depo-Medrol mixed with 2 mL 1% lidocaine were instilled.
The procedure was well-tolerated, and the patient was discharged
thirty minutes following the injection in good condition.

COMPLICATIONS:
None.
IMPRESSION: Technically successful epidural injection on the RIGHT L4-5 # 1.

## 2019-05-29 ENCOUNTER — Encounter (HOSPITAL_COMMUNITY): Payer: Self-pay | Admitting: Psychiatry

## 2019-05-29 ENCOUNTER — Telehealth (INDEPENDENT_AMBULATORY_CARE_PROVIDER_SITE_OTHER): Payer: Self-pay | Admitting: Psychiatry

## 2019-05-29 DIAGNOSIS — F332 Major depressive disorder, recurrent severe without psychotic features: Secondary | ICD-10-CM

## 2019-05-29 DIAGNOSIS — M797 Fibromyalgia: Secondary | ICD-10-CM

## 2019-05-29 DIAGNOSIS — F411 Generalized anxiety disorder: Secondary | ICD-10-CM

## 2019-05-29 MED ORDER — LAMOTRIGINE 200 MG PO TABS: 200.0000 mg | ORAL_TABLET | Freq: Every day | ORAL | 0 refills | Status: DC

## 2019-05-29 MED ORDER — PAROXETINE HCL 20 MG PO TABS: ORAL_TABLET | ORAL | 0 refills | Status: DC

## 2019-05-29 MED ORDER — TRAZODONE HCL 100 MG PO TABS: ORAL_TABLET | ORAL | 0 refills | Status: DC

## 2019-05-29 NOTE — Progress Notes (Signed)
Patient ID: Jennifer [name], female   DOB: July 13, 1960, 59 y.o.   MRN: [id number]   [facility] Follow-up Outpatient Visit Telepsych visit CHEROKEE BOCCIO [birth year]  Date: 05/29/2019  Chief complaint : follow up,  Depression and insomnia HPI:  I connected with Delila Spence on 05/29/19 at  91;50 PM EDT by telephone and verified that I am speaking with the correct person using two identifiers.  Patient location: home Provider location : home office  I discussed the limitations, risks, security and privacy concerns of performing an evaluation and management service by telephone and the availability of in person appointments. I also discussed with the patient that there may be a patient responsible charge related to this service. The patient expressed understanding and agreed to proceed.  HPI :  Ms. Pandey is a 59 year old female with a diagnosis of major Depressive Disorder. 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   Doing fair, content with meds, supportive husband Sleep gets irregular without trazadone so wants to keep it  On gabapantin for pain, fibro and restless legs    Not depressed or suicidal,  Depression not worse  daughter is supportive   . Severity: Depression: better  Anxiety- fluctuates     Review of Systems  Cardiovascular: Negative for chest pain.  Musculoskeletal: Positive for myalgias.  Skin: Negative for itching.  Psychiatric/Behavioral: Negative for substance abuse and suicidal ideas. The patient has insomnia.      Physical Exam  Vitals reviewed.  Constitutional: She appears well-developed and well-nourished. No distress.  Skin: She is not diaphoretic.     Past Medical History: Reviewed.  Past Medical History   Diagnosis  Date   .  Discoid lupus    .  Thyroid disease    .  Perimenopausal    .  Fibromyalgia    .  CFS (chronic fatigue syndrome)    .  Sleep apnea      Allergies: Reviewed.  Allergies    Allergen  Reactions   .  Acetaminophen      Pt states she is unable to take this bc of Hashimoto Thyroid   .  Hydrocodone-Acetaminophen      REACTION: agitation   .  Oxycodone      Hallucinating, sweating, itching    Current Medications: Reviewed.  Current Outpatient Medications on File Prior to Visit  Medication Sig Dispense Refill  . albuterol (PROVENTIL HFA) 108 (90 Base) MCG/ACT inhaler Inhale 1-2 puffs into the lungs every 4 (four) hours as needed for wheezing or shortness of breath. 1 Inhaler 3  . atorvastatin (LIPITOR) 40 MG tablet Take 1 tablet (40 mg total) by mouth daily at 6 PM. 90 tablet 3  . azelastine (OPTIVAR) 0.05 % ophthalmic solution Place 1 drop into both eyes 2 (two) times daily as needed (itchy/watery eyes). 6 mL 0  . fluticasone (FLONASE) 50 MCG/ACT nasal spray One spray in each nostril twice a day, use left hand for right nostril, and right hand for left nostril. 48 g 3  . Fluticasone-Umeclidin-Vilant (TRELEGY ELLIPTA) 100-62.5-25 MCG/INH AEPB Inhale 1 puff into the lungs daily. 1 each 11  . gabapentin (NEURONTIN) 800 MG tablet Take 2 tablets (1,600 mg total) by mouth 2 (two) times daily. 360 tablet 1  . levothyroxine (SYNTHROID) 150 MCG tablet TAKE 1 TABLET BY MOUTH DAILY BEFORE BREAKFAST. 90 tablet 1  . methotrexate (RHEUMATREX) 7.5 MG tablet Take 1 tablet (7.5 mg total) by mouth once a week. Caution" Chemotherapy. Protect  from light. (Patient not taking: Reported on 08/27/2018) 4 tablet 1  . Minoxidil 5 % FOAM Apply 1 application topically 2 (two) times daily. Apply twice daily for up to 4 months 1 Can 11  . montelukast (SINGULAIR) 10 MG tablet Take 1 tablet (10 mg total) by mouth daily. 90 tablet 3   No current facility-administered medications on file prior to visit.       Family History: Reviewed.  Family History   Problem  Relation  Age of Onset   .  Hypertension  Father    .  Heart disease  Sister  45      heart attack    .  Depression  Brother     .  Cancer  Mother  60      colon    Psychiatric Specialty Exam:     Speech:  Clear and Coherent and Normal Rate  Volume:  Normal  Mood: fair  Affect: congruent  Thought Process:  clear  Orientation:  Full (Time, Place, and Person)  Thought Content:  WDL  Suicidal Thoughts:  No  Homicidal Thoughts:  No  Memory:  fair  Judgement:  Good  Insight:  Fair  Psychomotor Activity:  decreased  Concentration:  Fair  Recall:  Poor  Akathisia:  No  Handed:  Right  Fund of knowledge-average to above average  Language-Intact  AIMS (if indicated):     Assets:  Manufacturing systems engineer Desire for Improvement Financial Resources/Insurance Housing Intimacy Leisure Time Physical Health Resilience Social Support Talents/Skills Transportation Vocational/Educational  Language intact   Fund of knowledge is good      Assessment:  Major Depression, Recurrent severe  Alcohol Dependence in full sustained remission-stable AXIS I  Major Depression, Recurrent severe, Alcohol Dependence in full sustained remission . Fibromyalgia.     Treatment Plan/Recommendations:   Major depression; doing fair, continue lamicxtal. paxil Also has restless legs. Says gaba helps also for fibromyalgia. Gets gaba from primary care Pain can effect sleep or mood  Anxiety disorder; fluctuates, continue paxil Fibromyalgia; baseline, on gaba, paxil continue Refills sent, reviewed sleep hygiene in detail   I discussed the assessment and treatment plan with the patient. The patient was provided an opportunity to ask questions and all were answered. The patient agreed with the plan and demonstrated an understanding of the instructions.   The patient was advised to call back or seek an in-person evaluation if the symptoms worsen or if the condition fails to improve as anticipated. Wants to come in 6-8  For insurance reason can call back earlier for conerns   I provided 15 minutes of non-face-to-face time during  this encounter. Thresa Ross, MD

## 2019-06-18 ENCOUNTER — Encounter: Payer: Self-pay | Admitting: Sports Medicine

## 2019-06-18 ENCOUNTER — Other Ambulatory Visit: Payer: Self-pay

## 2019-06-18 ENCOUNTER — Ambulatory Visit (INDEPENDENT_AMBULATORY_CARE_PROVIDER_SITE_OTHER): Payer: Self-pay | Admitting: Sports Medicine

## 2019-06-18 DIAGNOSIS — H04203 Unspecified epiphora, bilateral lacrimal glands: Secondary | ICD-10-CM | POA: Insufficient documentation

## 2019-06-18 DIAGNOSIS — Z299 Encounter for prophylactic measures, unspecified: Secondary | ICD-10-CM

## 2019-06-18 MED ORDER — KETOTIFEN FUMARATE 0.025 % OP SOLN: 1.0000 [drp] | Freq: Two times a day (BID) | OPHTHALMIC | 0 refills | Status: DC

## 2019-06-18 NOTE — Progress Notes (Signed)
    Procedures performed today:    None.  Independent interpretation of notes and tests performed by another provider:   None.  Brief History, Exam, Impression, and Recommendations:    Preventive measure Due for mammogram and colon cancer screening but declines this, she does not have insurance and tells me she cannot pay for it.  Watery eyes Exam is overall unremarkable, vision grossly normal, good pupillary response to light, she did not respond well to Optivar, switching to ketotifen. I would also like a second opinion from optometry down the road.    ___________________________________________ Ihor Austin. Benjamin Stain, M.D., ABFM., CAQSM. Primary Care and Sports Medicine [facility]  Adjunct Instructor of Family Medicine  [employer]

## 2019-06-18 NOTE — Assessment & Plan Note (Signed)
Due for mammogram and colon cancer screening but declines this, she does not have insurance and tells me she cannot pay for it.

## 2019-06-18 NOTE — Assessment & Plan Note (Signed)
Exam is overall unremarkable, vision grossly normal, good pupillary response to light, she did not respond well to Optivar, switching to ketotifen. I would also like a second opinion from optometry down the road.

## 2019-06-18 NOTE — Patient Instructions (Signed)
Ketotifen eye solution What is this medicine? KETOTIFEN (kee toe TYE fen) eye drops are used in the eye to prevent itching that is caused by allergies. This medicine may be used for other purposes; ask your health care provider or pharmacist if you have questions. COMMON BRAND NAME(S): Alaway, Children's Alaway, Claritin Eye, Eye Itch Relief, Itchy Eye, Zaditor, Zyrtec Itchy Eye What should I tell my health care provider before I take this medicine? They need to know if you have any of these conditions:  wear contact lenses  an unusual or allergic reaction to ketotifen, other medicines, foods, dyes, or preservatives  pregnant or trying to get pregnant  breast-feeding How should I use this medicine? This medicine is only for use in the eye. Do not take by mouth. Follow the directions on the prescription label. Wash hands before and after use. Tilt the head back slightly and pull down the lower eyelid with the index finger to form a pouch. Try not to touch the tip of the dropper to your eye, fingertips, or any other surface. Squeeze the prescribed number of drops into the pouch. Close the eye gently to spread the drops. Your vision may blur for a few minutes. Use your doses at regular intervals. Do not use your medicine more often than directed. If you use other eye medicines, they should be used at least 10 minutes before or after this medicine. Eye ointments should be applied last. Talk to your pediatrician regarding the use of this medicine in children. Special care may be needed. While this drug may be prescribed for children as young as 88 years of age for selected conditions, precautions do apply. Overdosage: If you think you have taken too much of this medicine contact a poison control center or emergency room at once. NOTE: This medicine is only for you. Do not share this medicine with others. What if I miss a dose? If you miss a dose, use it as soon as you can. If it is almost time for  your next dose, use only that dose. Do not use double or extra doses. What may interact with this medicine? Interactions are not expected. Do not use any other eye products without telling your doctor or health care professional. This list may not describe all possible interactions. Give your health care provider a list of all the medicines, herbs, non-prescription drugs, or dietary supplements you use. Also tell them if you smoke, drink alcohol, or use illegal drugs. Some items may interact with your medicine. What should I watch for while using this medicine? Tell your doctor or health care professional if your symptoms do not start to improve in 2 or 3 days. Report any serious side effects promptly. Stop using this medicine if your eyes get swollen, painful, or have a discharge, and see your doctor or health care professional as soon as you can. Contact lenses may be inserted 10 minutes after putting the medicine in the eye. Do not wear contact lenses if your eyes are red. You should not use this medicine to treat irritation that is caused by contact lenses. What side effects may I notice from receiving this medicine? Side effects that you should report to your doctor or health care professional as soon as possible:  skin rash, hives, or itching  swelling of the lips, tongue or face Side effects that usually do not require medical attention (report to your doctor or health care professional if they continue or are bothersome):  burning, discomfort,  stinging, or tearing immediately after use  eyelid swelling or eye dryness  headache  sensitivity of the eyes to sunlight This list may not describe all possible side effects. Call your doctor for medical advice about side effects. You may report side effects to FDA at 1-800-FDA-1088. Where should I keep my medicine? Keep out of the reach of children. Store between 4 and 25 degrees C (39 and 77 degrees F). Do not freeze. Protect from light.  Throw away any unused medicine after the expiration date. NOTE: This sheet is a summary. It may not cover all possible information. If you have questions about this medicine, talk to your doctor, pharmacist, or health care provider.  2020 Elsevier/Gold Standard (2007-06-22 14:36:34)

## 2019-06-20 ENCOUNTER — Telehealth (INDEPENDENT_AMBULATORY_CARE_PROVIDER_SITE_OTHER): Payer: Self-pay | Admitting: Psychiatry

## 2019-06-20 ENCOUNTER — Encounter (HOSPITAL_COMMUNITY): Payer: Self-pay | Admitting: Psychiatry

## 2019-06-20 DIAGNOSIS — M797 Fibromyalgia: Secondary | ICD-10-CM

## 2019-06-20 DIAGNOSIS — F332 Major depressive disorder, recurrent severe without psychotic features: Secondary | ICD-10-CM

## 2019-06-20 DIAGNOSIS — F5102 Adjustment insomnia: Secondary | ICD-10-CM

## 2019-06-20 DIAGNOSIS — F411 Generalized anxiety disorder: Secondary | ICD-10-CM

## 2019-06-20 NOTE — Progress Notes (Signed)
Patient ID: Jennifer [name], female   DOB: 1960-04-03, 59 y.o.   MRN: [id number]   [place] Follow-up Outpatient Visit Telepsych visit ROSSANNA SPITZLEY 21-Nov-1960  Date: 06/20/2019  Chief complaint : follow up,  Depression and insomnia HPI:   I connected with Delila Spence on 06/20/19 at  4:00 PM EDT by telephone and verified that I am speaking with the correct person using two identifiers.  Patient location: home Provider location : home office  I discussed the limitations, risks, security and privacy concerns of performing an evaluation and management service by telephone and the availability of in person appointments. I also discussed with the patient that there may be a patient responsible charge related to this service. The patient expressed understanding and agreed to proceed.  HPI :  Ms. Asleson is a 59  year old female with a diagnosis of major Depressive Disorder. 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   Was doing fair few weeks ago during review.  Earlier appointment done today, feeling tired , dysphoric, main concern being not able to sleep She does lie in the bed during the day or spend most time during the day in bed room. Says has fibro and cant do much so listen to news Thinking of the past feeling depressed  Current husband is supportive Have gained weight states to be 220 lbs . Couple years ago was around 195  On gabapantin for pain, fibro and restless legs    Depression worse Feels withdrawn, tired and dwells on past  daughter is supportive   . Severity:   Anxiety- stress worriful    Review of Systems  Cardiovascular: Negative for chest pain.  Musculoskeletal: Positive for myalgias.  Skin: Negative for itching.  Psychiatric/Behavioral: Positive for depression. Negative for substance abuse and suicidal ideas. The patient has insomnia.      Physical Exam  Vitals reviewed.  Constitutional: She appears well-developed  and well-nourished. No distress.  Skin: She is not diaphoretic.     Past Medical History: Reviewed.  Past Medical History   Diagnosis  Date   .  Discoid lupus    .  Thyroid disease    .  Perimenopausal    .  Fibromyalgia    .  CFS (chronic fatigue syndrome)    .  Sleep apnea      Allergies: Reviewed.  Allergies   Allergen  Reactions   .  Acetaminophen      Pt states she is unable to take this bc of Hashimoto Thyroid   .  Hydrocodone-Acetaminophen      REACTION: agitation   .  Oxycodone      Hallucinating, sweating, itching    Current Medications: Reviewed.  Current Outpatient Medications on File Prior to Visit  Medication Sig Dispense Refill  . albuterol (PROVENTIL HFA) 108 (90 Base) MCG/ACT inhaler Inhale 1-2 puffs into the lungs every 4 (four) hours as needed for wheezing or shortness of breath. 1 Inhaler 3  . atorvastatin (LIPITOR) 40 MG tablet Take 1 tablet (40 mg total) by mouth daily at 6 PM. 90 tablet 3  . fluticasone (FLONASE) 50 MCG/ACT nasal spray One spray in each nostril twice a day, use left hand for right nostril, and right hand for left nostril. 48 g 3  . gabapentin (NEURONTIN) 800 MG tablet Take 2 tablets (1,600 mg total) by mouth 2 (two) times daily. (Patient taking differently: Take 800 mg by mouth 2 (two) times daily. ) 360 tablet 1  .  ketotifen (ZADITOR) 0.025 % ophthalmic solution Place 1 drop into both eyes 2 (two) times daily. 5 mL 0  . lamoTRIgine (LAMICTAL) 200 MG tablet Take 1 tablet (200 mg total) by mouth daily. 90 tablet 0  . levothyroxine (SYNTHROID) 150 MCG tablet TAKE 1 TABLET BY MOUTH DAILY BEFORE BREAKFAST. 90 tablet 1  . methotrexate (RHEUMATREX) 7.5 MG tablet Take 1 tablet (7.5 mg total) by mouth once a week. Caution" Chemotherapy. Protect from light. (Patient not taking: Reported on 08/27/2018) 4 tablet 1  . PARoxetine (PAXIL) 20 MG tablet TAKE 1 TABLET BY MOUTH EVERY DAY 90 tablet 0  . traZODone (DESYREL) 100 MG tablet TAKE 2-3 TABS BY MOUTH  EACH EVENING 270 tablet 0   No current facility-administered medications on file prior to visit.       Family History: Reviewed.  Family History   Problem  Relation  Age of Onset   .  Hypertension  Father    .  Heart disease  Sister  45      heart attack    .  Depression  Brother    .  Cancer  Mother  22      colon    Psychiatric Specialty Exam:     Speech:  Clear and Coherent and Normal Rate  Volume:  Normal  Mood: dysphoric  Affect: congruent  Thought Process:  clear  Orientation:  Full (Time, Place, and Person)  Thought Content:  WDL  Suicidal Thoughts:  No  Homicidal Thoughts:  No  Memory:  fair  Judgement:  Good  Insight:  Fair  Psychomotor Activity:  decreased  Concentration:  Fair  Recall:  Poor  Akathisia:  No  Handed:  Right  Fund of knowledge-average to above average  Language-Intact  AIMS (if indicated):     Assets:  Manufacturing systems engineer Desire for Improvement Financial Resources/Insurance Housing Intimacy Leisure Time Physical Health Resilience Social Support Talents/Skills Transportation Vocational/Educational  Language intact   Fund of knowledge is good      Assessment:  Major Depression, Recurrent severe  Alcohol Dependence in full sustained remission-stable AXIS I  Major Depression, Recurrent severe, Alcohol Dependence in full sustained remission . Fibromyalgia.     Treatment Plan/Recommendations:   Major depression; dysphoric , increase paxil to 30mg  recommend therapy, says cannot afford it Continue lamictal. No rash Also has restless legs. Says gaba helps also for fibromyalgia. Gets gaba from primary care Pain effecting sleep or mood  Anxiety disorder; stressed, increase paxil, Fibromyalgia;: feels pain, increase paxil,  Insomnia; reviewed sleep hygiene, avoid bed room and lying in bed or sofa during the day.  Taking trazadone at night Should get sleep study as possible sleep apnea contributing to tiredness and  insomnia Says she does not have insurance and cant afford it, explained it would be challenging to treat insomnia as it may be related with apnea or some component  Also to walk or distract from negative thoughts and to avoid bedroom during the day  Has meds for now, call call for early refill on paxil as dose is increased  I discussed the assessment and treatment plan with the patient. The patient was provided an opportunity to ask questions and all were answered. The patient agreed with the plan and demonstrated an understanding of the instructions.   The patient was advised to call back or seek an in-person evaluation if the symptoms worsen or if the condition fails to improve as anticipated. Fu 2-3 weeks, not suicidal   I provided  20 minutes of non-face-to-face time during this encounter. Thresa Ross, MD

## 2019-07-22 ENCOUNTER — Encounter (HOSPITAL_COMMUNITY): Payer: Self-pay | Admitting: Psychiatry

## 2019-07-22 ENCOUNTER — Telehealth (INDEPENDENT_AMBULATORY_CARE_PROVIDER_SITE_OTHER): Payer: Self-pay | Admitting: Psychiatry

## 2019-07-22 DIAGNOSIS — F411 Generalized anxiety disorder: Secondary | ICD-10-CM

## 2019-07-22 DIAGNOSIS — M797 Fibromyalgia: Secondary | ICD-10-CM

## 2019-07-22 DIAGNOSIS — F332 Major depressive disorder, recurrent severe without psychotic features: Secondary | ICD-10-CM

## 2019-07-22 MED ORDER — TRAZODONE HCL 100 MG PO TABS: ORAL_TABLET | ORAL | 0 refills | Status: DC

## 2019-07-22 MED ORDER — PAROXETINE HCL 20 MG PO TABS: ORAL_TABLET | ORAL | 0 refills | Status: DC

## 2019-07-22 MED ORDER — LAMOTRIGINE 200 MG PO TABS: 200.0000 mg | ORAL_TABLET | Freq: Every day | ORAL | 0 refills | Status: DC

## 2019-07-22 NOTE — Progress Notes (Signed)
Patient ID: Jennifer [name], female   DOB: 1960-11-29, 59 y.o.   MRN: [id number]   [facility] Follow-up Outpatient Visit Telepsych visit DELTHA BERNALES [birth year]  Date: 07/22/2019  Chief complaint : follow up,  Depression and insomnia HPI:    I connected with Ewing Schlein on 07/22/19 at  4:00 PM EDT by telephone and verified that I am speaking with the correct person using two identifiers.  Patient location: home Provider location : home office  I discussed the limitations, risks, security and privacy concerns of performing an evaluation and management service by telephone and the availability of in person appointments. I also discussed with the patient that there may be a patient responsible charge related to this service. The patient expressed understanding and agreed to proceed.  HPI :  Jennifer [name] is a 59  year old female with a diagnosis of major Depressive Disorder. 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   Last visit was early appointment, was depressed, aches of bodies, felt crying and had to be seen early paxil was increased she is improved Noticed much change since last visit, more clear and feels worries and dysphorica has improved Not dwelling on past  Current husband is supportive   On gabapantin for pain, fibro and restless legs    Depression improved   daughter is supportive   . Severity:   Anxiety- stress worriful    Review of Systems  Cardiovascular: Negative for chest pain.  Musculoskeletal: Positive for myalgias.  Skin: Negative for itching.  Psychiatric/Behavioral: Negative for substance abuse and suicidal ideas.     Physical Exam  Vitals reviewed.  Constitutional: She appears well-developed and well-nourished. No distress.  Skin: She is not diaphoretic.     Past Medical History: Reviewed.  Past Medical History   Diagnosis  Date   .  Discoid lupus    .  Thyroid disease    .  Perimenopausal    .   Fibromyalgia    .  CFS (chronic fatigue syndrome)    .  Sleep apnea      Allergies: Reviewed.  Allergies   Allergen  Reactions   .  Acetaminophen      Pt states she is unable to take this bc of Hashimoto Thyroid   .  Hydrocodone-Acetaminophen      REACTION: agitation   .  Oxycodone      Hallucinating, sweating, itching    Current Medications: Reviewed.  Current Outpatient Medications on File Prior to Visit  Medication Sig Dispense Refill  . albuterol (PROVENTIL HFA) 108 (90 Base) MCG/ACT inhaler Inhale 1-2 puffs into the lungs every 4 (four) hours as needed for wheezing or shortness of breath. 1 Inhaler 3  . atorvastatin (LIPITOR) 40 MG tablet Take 1 tablet (40 mg total) by mouth daily at 6 PM. 90 tablet 3  . fluticasone (FLONASE) 50 MCG/ACT nasal spray One spray in each nostril twice a day, use left hand for right nostril, and right hand for left nostril. 48 g 3  . gabapentin (NEURONTIN) 800 MG tablet Take 2 tablets (1,600 mg total) by mouth 2 (two) times daily. (Patient taking differently: Take 800 mg by mouth 2 (two) times daily. ) 360 tablet 1  . ketotifen (ZADITOR) 0.025 % ophthalmic solution Place 1 drop into both eyes 2 (two) times daily. 5 mL 0  . levothyroxine (SYNTHROID) 150 MCG tablet TAKE 1 TABLET BY MOUTH DAILY BEFORE BREAKFAST. 90 tablet 1  . methotrexate (RHEUMATREX) 7.5 MG  tablet Take 1 tablet (7.5 mg total) by mouth once a week. Caution" Chemotherapy. Protect from light. (Patient not taking: Reported on 08/27/2018) 4 tablet 1   No current facility-administered medications on file prior to visit.       Family History: Reviewed.  Family History   Problem  Relation  Age of Onset   .  Hypertension  Father    .  Heart disease  Sister  45      heart attack    .  Depression  Brother    .  Cancer  Mother  68      colon    Psychiatric Specialty Exam:     Speech:  Clear and Coherent and Normal Rate  Volume:  Normal  Mood: improved  Affect: congruent  Thought  Process:  clear  Orientation:  Full (Time, Place, and Person)  Thought Content:  WDL  Suicidal Thoughts:  No  Homicidal Thoughts:  No  Memory:  fair  Judgement:  Good  Insight:  Fair  Psychomotor Activity:  decreased  Concentration:  Fair  Recall:  Poor  Akathisia:  No  Handed:  Right  Fund of knowledge-average to above average  Language-Intact  AIMS (if indicated):     Assets:  Manufacturing systems engineer Desire for Improvement Financial Resources/Insurance Housing Intimacy Leisure Time Physical Health Resilience Social Support Talents/Skills Transportation Vocational/Educational  Language intact   Fund of knowledge is good      Assessment:  Major Depression, Recurrent severe  Alcohol Dependence in full sustained remission-stable AXIS I  Major Depression, Recurrent severe, Alcohol Dependence in full sustained remission . Fibromyalgia.     Treatment Plan/Recommendations:   Major depression; improved. Continue paxil now at 30mg   Pain effecting sleep or mood , some better  Anxiety disorder; improved, continue paxil Fibromyalgia;: feels pain, increase paxil,  Insomnia; reviewed sleep hygiene, avoid bed room and lying in bed or sofa during the day.  Taking trazadone at night   Also to walk or distract from negative thoughts and to avoid bedroom during the day    I discussed the assessment and treatment plan with the patient. The patient was provided an opportunity to ask questions and all were answered. The patient agreed with the plan and demonstrated an understanding of the instructions.   The patient was advised to call back or seek an in-person evaluation if the symptoms worsen or if the condition fails to improve as anticipated. Fu 49m. meds renewed  I provided 15 minutes of non-face-to-face time during this encounter. 1m, MD

## 2019-09-03 ENCOUNTER — Other Ambulatory Visit: Payer: Self-pay | Admitting: Sports Medicine

## 2019-09-03 DIAGNOSIS — E039 Hypothyroidism, unspecified: Secondary | ICD-10-CM

## 2019-09-16 DIAGNOSIS — G894 Chronic pain syndrome: Secondary | ICD-10-CM | POA: Diagnosis not present

## 2019-09-16 DIAGNOSIS — M797 Fibromyalgia: Secondary | ICD-10-CM | POA: Diagnosis not present

## 2019-09-16 DIAGNOSIS — Z5181 Encounter for therapeutic drug level monitoring: Secondary | ICD-10-CM | POA: Diagnosis not present

## 2019-09-16 DIAGNOSIS — Z79899 Other long term (current) drug therapy: Secondary | ICD-10-CM | POA: Diagnosis not present

## 2019-10-14 ENCOUNTER — Encounter: Payer: Self-pay | Admitting: Sports Medicine

## 2019-10-14 ENCOUNTER — Ambulatory Visit (INDEPENDENT_AMBULATORY_CARE_PROVIDER_SITE_OTHER): Payer: BC Managed Care – PPO

## 2019-10-14 ENCOUNTER — Other Ambulatory Visit: Payer: Self-pay

## 2019-10-14 ENCOUNTER — Ambulatory Visit (INDEPENDENT_AMBULATORY_CARE_PROVIDER_SITE_OTHER): Payer: BC Managed Care – PPO | Admitting: Sports Medicine

## 2019-10-14 DIAGNOSIS — R0602 Shortness of breath: Secondary | ICD-10-CM

## 2019-10-14 MED ORDER — AZITHROMYCIN 250 MG PO TABS: ORAL_TABLET | ORAL | 0 refills | Status: DC

## 2019-10-14 MED ORDER — PREDNISONE 50 MG PO TABS: 50.0000 mg | ORAL_TABLET | Freq: Every day | ORAL | 0 refills | Status: DC

## 2019-10-14 MED ORDER — ALBUTEROL SULFATE (2.5 MG/3ML) 0.083% IN NEBU: 2.5000 mg | INHALATION_SOLUTION | Freq: Four times a day (QID) | RESPIRATORY_TRACT | 6 refills | Status: AC | PRN

## 2019-10-14 MED ORDER — TRELEGY ELLIPTA 100-62.5-25 MCG/INH IN AEPB: 1.0000 | INHALATION_SPRAY | Freq: Every day | RESPIRATORY_TRACT | 11 refills | Status: DC

## 2019-10-14 NOTE — Progress Notes (Signed)
    Procedures performed today:    None.  Independent interpretation of notes and tests performed by another provider:   None.  Brief History, Exam, Impression, and Recommendations:    Shortness of breath This is a pleasant 59 year old female, fairly debilitated, history of chronic recurrent bronchitis with normal PFTs. She is now having a recurrence of shortness of breath, mild noncardiac type chest pain. She has run out of her nebulizer and she has stopped using her Trelegy. Chest pain is nonexertional, substernal, minimally pleuritic without radiation down the arm, no nausea, diaphoresis, or radiation to the neck. Her shortness of breath has been relatively chronic and described more as easy fatigability. Her exam is fairly benign, clear lungs, speaking full sentences, no accessory muscle use or nasal flaring. Only minimal lower extremity edema. Certainly we need to do a shock and work-up again, part of this is her coming off of her Trelegy and nebulizer, refilling these. We are going to do prednisone, azithromycin, chest x-ray, as well as labs including CBC, CMP, cardiac enzymes, BNP, D-dimer. Return to see me in 2 weeks, if she feels better we can certainly give her flu shot at that point.    ___________________________________________ Ihor Austin. Benjamin Stain, M.D., ABFM., CAQSM. Primary Care and Sports Medicine [facility]  Adjunct Instructor of Family Medicine  [employer]

## 2019-10-14 NOTE — Assessment & Plan Note (Signed)
This is a pleasant 59 year old female, fairly debilitated, history of chronic recurrent bronchitis with normal PFTs. She is now having a recurrence of shortness of breath, mild noncardiac type chest pain. She has run out of her nebulizer and she has stopped using her Trelegy. Chest pain is nonexertional, substernal, minimally pleuritic without radiation down the arm, no nausea, diaphoresis, or radiation to the neck. Her shortness of breath has been relatively chronic and described more as easy fatigability. Her exam is fairly benign, clear lungs, speaking full sentences, no accessory muscle use or nasal flaring. Only minimal lower extremity edema. Certainly we need to do a shock and work-up again, part of this is her coming off of her Trelegy and nebulizer, refilling these. We are going to do prednisone, azithromycin, chest x-ray, as well as labs including CBC, CMP, cardiac enzymes, BNP, D-dimer. Return to see me in 2 weeks, if she feels better we can certainly give her flu shot at that point.

## 2019-10-15 LAB — COMPREHENSIVE METABOLIC PANEL
AG Ratio: 2.2 (calc) (ref 1.0–2.5)
ALT: 41 U/L — ABNORMAL HIGH (ref 6–29)
AST: 29 U/L (ref 10–35)
Albumin: 4.4 g/dL (ref 3.6–5.1)
Alkaline phosphatase (APISO): 57 U/L (ref 37–153)
BUN/Creatinine Ratio: 8 (calc) (ref 6–22)
BUN: 10 mg/dL (ref 7–25)
CO2: 30 mmol/L (ref 20–32)
Calcium: 9.4 mg/dL (ref 8.6–10.4)
Chloride: 105 mmol/L (ref 98–110)
Creat: 1.24 mg/dL — ABNORMAL HIGH (ref 0.50–0.99)
Globulin: 2 g/dL (calc) (ref 1.9–3.7)
Glucose, Bld: 113 mg/dL — ABNORMAL HIGH (ref 65–99)
Potassium: 4.3 mmol/L (ref 3.5–5.3)
Sodium: 142 mmol/L (ref 135–146)
Total Bilirubin: 0.5 mg/dL (ref 0.2–1.2)
Total Protein: 6.4 g/dL (ref 6.1–8.1)

## 2019-10-15 LAB — CBC WITH DIFFERENTIAL/PLATELET
Absolute Monocytes: 442 cells/uL (ref 200–950)
Basophils Absolute: 82 cells/uL (ref 0–200)
Basophils Relative: 1.2 %
Eosinophils Absolute: 41 cells/uL (ref 15–500)
Eosinophils Relative: 0.6 %
HCT: 43.1 % (ref 35.0–45.0)
Hemoglobin: 14.9 g/dL (ref 11.7–15.5)
Lymphs Abs: 966 cells/uL (ref 850–3900)
MCH: 32.9 pg (ref 27.0–33.0)
MCHC: 34.6 g/dL (ref 32.0–36.0)
MCV: 95.1 fL (ref 80.0–100.0)
MPV: 9.8 fL (ref 7.5–12.5)
Monocytes Relative: 6.5 %
Neutro Abs: 5270 cells/uL (ref 1500–7800)
Neutrophils Relative %: 77.5 %
Platelets: 144 10*3/uL (ref 140–400)
RBC: 4.53 10*6/uL (ref 3.80–5.10)
RDW: 13.5 % (ref 11.0–15.0)
Total Lymphocyte: 14.2 %
WBC: 6.8 10*3/uL (ref 3.8–10.8)

## 2019-10-15 LAB — CK TOTAL AND CKMB (NOT AT ARMC)
CK, MB: <0.7 ng/mL (ref 0–5.0)
Total CK: 41 U/L (ref 29–143)

## 2019-10-15 LAB — EXTRA SPECIMEN

## 2019-10-15 LAB — D-DIMER, QUANTITATIVE: D-Dimer, Quant: 0.31 mcg/mL FEU (ref <0.50)

## 2019-10-22 ENCOUNTER — Telehealth (INDEPENDENT_AMBULATORY_CARE_PROVIDER_SITE_OTHER): Payer: Self-pay | Admitting: Psychiatry

## 2019-10-22 ENCOUNTER — Encounter (HOSPITAL_COMMUNITY): Payer: Self-pay | Admitting: Psychiatry

## 2019-10-22 DIAGNOSIS — M797 Fibromyalgia: Secondary | ICD-10-CM

## 2019-10-22 DIAGNOSIS — F411 Generalized anxiety disorder: Secondary | ICD-10-CM

## 2019-10-22 DIAGNOSIS — F332 Major depressive disorder, recurrent severe without psychotic features: Secondary | ICD-10-CM

## 2019-10-22 MED ORDER — PAROXETINE HCL 20 MG PO TABS: ORAL_TABLET | ORAL | 0 refills | Status: DC

## 2019-10-22 MED ORDER — LAMOTRIGINE 200 MG PO TABS: 200.0000 mg | ORAL_TABLET | Freq: Every day | ORAL | 0 refills | Status: DC

## 2019-10-22 MED ORDER — TRAZODONE HCL 100 MG PO TABS: ORAL_TABLET | ORAL | 0 refills | Status: DC

## 2019-10-22 NOTE — Progress Notes (Signed)
Patient ID: Jennifer [name], female   DOB: [birth year], 59 y.o.   MRN: [id number]   [facility] Follow-up Outpatient Visit Telepsych visit JARI CAROLLO [birth year]  Date: 10/22/2019  Chief complaint : follow up,  Depression and insomnia HPI:     I connected with Jennifer [name] on 10/22/19 at  4:00 PM EDT by telephone and verified that I am speaking with the correct person using two identifiers.  Patient location: home Provider location : home office  I discussed the limitations, risks, security and privacy concerns of performing an evaluation and management service by telephone and the availability of in person appointments. I also discussed with the patient that there may be a patient responsible charge related to this service. The patient expressed understanding and agreed to proceed.  HPI :  [name] is a 59  year old female with a diagnosis of major Depressive Disorder. 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    Fatigue , mylagia keeps her down, recent bronchitis infection added tiredness Husband is supportive Tolerating meds keep some balance in mood and depression  no rash on lamictal   On gabapantin for pain, fibro and restless legs    Depression: baseline,  daughter is supportive      Review of Systems  Cardiovascular: Negative for chest pain.  Musculoskeletal: Positive for myalgias.  Skin: Negative for itching.  Psychiatric/Behavioral: Negative for substance abuse and suicidal ideas.     Physical Exam  Vitals reviewed.  Constitutional: She appears well-developed and well-nourished. No distress.  Skin: She is not diaphoretic.     Past Medical History: Reviewed.  Past Medical History   Diagnosis  Date   .  Discoid lupus    .  Thyroid disease    .  Perimenopausal    .  Fibromyalgia    .  CFS (chronic fatigue syndrome)    .  Sleep apnea      Allergies: Reviewed.  Allergies   Allergen  Reactions   .  Acetaminophen       Pt states she is unable to take this bc of Hashimoto Thyroid   .  Hydrocodone-Acetaminophen      REACTION: agitation   .  Oxycodone      Hallucinating, sweating, itching    Current Medications: Reviewed.  Current Outpatient Medications on File Prior to Visit  Medication Sig Dispense Refill  . albuterol (PROVENTIL HFA) 108 (90 Base) MCG/ACT inhaler Inhale 1-2 puffs into the lungs every 4 (four) hours as needed for wheezing or shortness of breath. 1 Inhaler 3  . albuterol (PROVENTIL) (2.5 MG/3ML) 0.083% nebulizer solution Take 3 mLs (2.5 mg total) by nebulization every 6 (six) hours as needed for wheezing or shortness of breath. 75 mL 6  . atorvastatin (LIPITOR) 40 MG tablet Take 1 tablet (40 mg total) by mouth daily at 6 PM. 90 tablet 3  . azithromycin (ZITHROMAX Z-PAK) 250 MG tablet Take 2 tablets (500 mg) on  Day 1,  followed by 1 tablet (250 mg) once daily on Days 2 through 5. 6 tablet 0  . fluticasone (FLONASE) 50 MCG/ACT nasal spray One spray in each nostril twice a day, use left hand for right nostril, and right hand for left nostril. 48 g 3  . Fluticasone-Umeclidin-Vilant (TRELEGY ELLIPTA) 100-62.5-25 MCG/INH AEPB Inhale 1 puff into the lungs daily. 1 each 11  . gabapentin (NEURONTIN) 800 MG tablet Take 2 tablets (1,600 mg total) by mouth 2 (two) times daily. (Patient taking differently:  Take 800 mg by mouth 2 (two) times daily. ) 360 tablet 1  . ketotifen (ZADITOR) 0.025 % ophthalmic solution Place 1 drop into both eyes 2 (two) times daily. 5 mL 0  . levothyroxine (SYNTHROID) 150 MCG tablet TAKE 1 TABLET BY MOUTH DAILY BEFORE BREAKFAST. 90 tablet 1  . methotrexate (RHEUMATREX) 7.5 MG tablet Take 1 tablet (7.5 mg total) by mouth once a week. Caution" Chemotherapy. Protect from light. (Patient not taking: Reported on 08/27/2018) 4 tablet 1  . predniSONE (DELTASONE) 50 MG tablet Take 1 tablet (50 mg total) by mouth daily. 5 tablet 0   No current facility-administered medications on file  prior to visit.       Family History: Reviewed.  Family History   Problem  Relation  Age of Onset   .  Hypertension  Father    .  Heart disease  Sister  45      heart attack    .  Depression  Brother    .  Cancer  Mother  40      colon    Psychiatric Specialty Exam:     Speech:  Clear and Coherent and Normal Rate  Volume:  Normal  Mood: improved  Affect: congruent  Thought Process:  clear  Orientation:  Full (Time, Place, and Person)  Thought Content:  WDL  Suicidal Thoughts:  No  Homicidal Thoughts:  No  Memory: fair  Judgement:  Good  Insight:  Fair  Psychomotor Activity:  decreased  Concentration:  Fair  Recall:  Poor  Akathisia:  No  Handed:  Right  Fund of knowledge-average to above average  Language-Intact  AIMS (if indicated):     Assets:  Manufacturing systems engineer Desire for Improvement Financial Resources/Insurance Housing Intimacy Leisure Time Physical Health Resilience Social Support Talents/Skills Transportation Vocational/Educational  Language intact   Fund of knowledge is good      Assessment:  Major Depression, Recurrent severe  Alcohol Dependence in full sustained remission-stable AXIS I  Major Depression, Recurrent severe, Alcohol Dependence in full sustained remission . Fibromyalgia.     Treatment Plan/Recommendations:   Major depression; baseline, continue paxil 30mg , lamictal Pain effecting sleep or mood , some better  Anxiety disorder: fluctuates, work on distraction, consider therapy, continue paxil Fibromyalgia;: feels pain, continue paxil and fu with primary care, on gabapentin Insomnia; reviewed sleep hygiene, avoid bed room and lying in bed or sofa during the day.  Taking trazadone at night   Also to walk or distract from negative thoughts and to avoid bedroom during the day    I discussed the assessment and treatment plan with the patient. The patient was provided an opportunity to ask questions and all were  answered. The patient agreed with the plan and demonstrated an understanding of the instructions.   The patient was advised to call back or seek an in-person evaluation if the symptoms worsen or if the condition fails to improve as anticipated. Fu 26m. meds renewed  I provided 15 minutes of non-face-to-face time during this encounter. 1m, MD

## 2019-10-23 ENCOUNTER — Other Ambulatory Visit: Payer: Self-pay | Admitting: Sports Medicine

## 2019-10-23 DIAGNOSIS — E785 Hyperlipidemia, unspecified: Secondary | ICD-10-CM

## 2019-10-28 ENCOUNTER — Ambulatory Visit: Payer: BC Managed Care – PPO | Admitting: Sports Medicine

## 2019-12-06 DIAGNOSIS — M797 Fibromyalgia: Secondary | ICD-10-CM

## 2019-12-06 MED ORDER — GABAPENTIN 800 MG PO TABS: 800.0000 mg | ORAL_TABLET | Freq: Two times a day (BID) | ORAL | 3 refills | Status: DC

## 2019-12-19 DIAGNOSIS — M797 Fibromyalgia: Secondary | ICD-10-CM | POA: Diagnosis not present

## 2019-12-19 DIAGNOSIS — Z9889 Other specified postprocedural states: Secondary | ICD-10-CM | POA: Diagnosis not present

## 2019-12-19 DIAGNOSIS — M47816 Spondylosis without myelopathy or radiculopathy, lumbar region: Secondary | ICD-10-CM | POA: Diagnosis not present

## 2019-12-19 DIAGNOSIS — M5030 Other cervical disc degeneration, unspecified cervical region: Secondary | ICD-10-CM | POA: Diagnosis not present

## 2020-01-08 ENCOUNTER — Telehealth (INDEPENDENT_AMBULATORY_CARE_PROVIDER_SITE_OTHER): Payer: BC Managed Care – PPO | Admitting: Medical-Surgical

## 2020-01-08 ENCOUNTER — Encounter: Payer: Self-pay | Admitting: Medical-Surgical

## 2020-01-08 ENCOUNTER — Other Ambulatory Visit: Payer: Self-pay

## 2020-01-08 VITALS — Temp 98.9°F

## 2020-01-08 DIAGNOSIS — T17908A Unspecified foreign body in respiratory tract, part unspecified causing other injury, initial encounter: Secondary | ICD-10-CM

## 2020-01-08 MED ORDER — AZITHROMYCIN 250 MG PO TABS: ORAL_TABLET | ORAL | 0 refills | Status: DC

## 2020-01-08 MED ORDER — AMOXICILLIN-POT CLAVULANATE 875-125 MG PO TABS: 1.0000 | ORAL_TABLET | Freq: Two times a day (BID) | ORAL | 0 refills | Status: DC

## 2020-01-08 NOTE — Progress Notes (Signed)
Virtual Visit via Video Note  I connected with Jennifer [name] on 01/08/20 at  1:00 PM EST by a video enabled telemedicine application and verified that I am speaking with the correct person using two identifiers.   I discussed the limitations of evaluation and management by telemedicine and the availability of in person appointments. The patient expressed understanding and agreed to proceed.  Patient location: home Provider locations: office  Subjective:    CC: Aspirated food  HPI: Pleasant 60 year old female presenting via MyChart video visit with reports of aspirating food approximately 3 days ago.  She reports a history of sleep eating and notes that she was eating a cookie while sleeping and aspirated the food.  She was able to get the food out but she since then she has had upper chest pain, and intermittent cough that is productive of green mucus,  some shortness of breath and chest tightness with exertion.  Denies fever, chills, nausea, vomiting, diarrhea, and sick contacts.  She does not currently smoke or drink alcohol.  She has not been on any recent antibiotics and does not have significant comorbidities.  Past medical history, Surgical history, Family history not pertinant except as noted below, Social history, Allergies, and medications have been entered into the medical record, reviewed, and corrections made.   Review of Systems: See HPI for pertinent positives and negatives.   Objective:    General: Speaking clearly in complete sentences without any shortness of breath.  Alert and oriented x3.  Normal judgment. No apparent acute distress.  Impression and Recommendations:    1. Aspiration into respiratory tract, initial encounter Unable to perform exam due to virtual visit.  Treating empirically with azithromycin and Augmentin per up-to-date recommendations.  Discussed avoidance of sleep eating to prevent risk of future aspiration.  If symptoms do not improve with antibiotic  treatment, will need to return for further evaluation and possible chest x-ray.  I discussed the assessment and treatment plan with the patient. The patient was provided an opportunity to ask questions and all were answered. The patient agreed with the plan and demonstrated an understanding of the instructions.   The patient was advised to call back or seek an in-person evaluation if the symptoms worsen or if the condition fails to improve as anticipated.  20 minutes of non-face-to-face time was provided during this encounter.  Return if symptoms worsen or fail to improve.  Thayer Ohm, DNP, APRN, FNP-BC [facility] and Sports Medicine

## 2020-01-19 ENCOUNTER — Other Ambulatory Visit (HOSPITAL_COMMUNITY): Payer: Self-pay | Admitting: Psychiatry

## 2020-01-19 DIAGNOSIS — F332 Major depressive disorder, recurrent severe without psychotic features: Secondary | ICD-10-CM

## 2020-01-23 ENCOUNTER — Telehealth (INDEPENDENT_AMBULATORY_CARE_PROVIDER_SITE_OTHER): Payer: BC Managed Care – PPO | Admitting: Psychiatry

## 2020-01-23 DIAGNOSIS — F411 Generalized anxiety disorder: Secondary | ICD-10-CM | POA: Diagnosis not present

## 2020-01-23 DIAGNOSIS — F332 Major depressive disorder, recurrent severe without psychotic features: Secondary | ICD-10-CM

## 2020-01-23 DIAGNOSIS — M797 Fibromyalgia: Secondary | ICD-10-CM | POA: Diagnosis not present

## 2020-01-24 ENCOUNTER — Other Ambulatory Visit (HOSPITAL_COMMUNITY): Payer: Self-pay | Admitting: Psychiatry

## 2020-01-24 ENCOUNTER — Encounter (HOSPITAL_COMMUNITY): Payer: Self-pay | Admitting: Psychiatry

## 2020-01-24 DIAGNOSIS — F332 Major depressive disorder, recurrent severe without psychotic features: Secondary | ICD-10-CM

## 2020-01-24 NOTE — Progress Notes (Signed)
Patient ID: Jennifer [name], female   DOB: 1960/10/29, 60 y.o.   MRN: [id number]   [facility] Follow-up Outpatient Visit Telepsych visit [name] 06-Apr-1960  Date: 01/23/2020  Chief complaint : follow up,  Depression and insomnia HPI:    Virtual Visit via Telephone Note  I connected with Jennifer [name] on 01/24/20 at  4:00 PM EST by telephone and verified that I am speaking with the correct person using two identifiers.  Location: Patient: home  Provider: home office   I discussed the limitations, risks, security and privacy concerns of performing an evaluation and management service by telephone and the availability of in person appointments. I also discussed with the patient that there may be a patient responsible charge related to this service. The patient expressed understanding and agreed to proceed.       I discussed the assessment and treatment plan with the patient. The patient was provided an opportunity to ask questions and all were answered. The patient agreed with the plan and demonstrated an understanding of the instructions.   The patient was advised to call back or seek an in-person evaluation if the symptoms worsen or if the condition fails to improve as anticipated.  I provided 14 minutes of non-face-to-face time during this encounter.   Thresa Ross, MD   HPI :  Ms. Jennifer [name] is a 60  year old female with a diagnosis of major Depressive Disorder. 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   Continues to struggle with fibromyalgia effecting mood as well more so mobility Has a supportive husband On pain meds  Feels anti depressent does work and not need change  No rash   On gabapantin for pain, fibro and restless legs    Depression: baseline  daughter is supportive      Review of Systems  Cardiovascular: Negative for chest pain.  Musculoskeletal: Positive for myalgias.  Skin: Negative for itching.   Psychiatric/Behavioral: Negative for substance abuse and suicidal ideas.     Physical Exam  Vitals reviewed.  Constitutional: She appears well-developed and well-nourished. No distress.  Skin: She is not diaphoretic.     Past Medical History: Reviewed.  Past Medical History   Diagnosis  Date     Discoid lupus      Thyroid disease      Perimenopausal      Fibromyalgia      CFS (chronic fatigue syndrome)      Sleep apnea      Allergies: Reviewed.  Allergies   Allergen  Reactions     Acetaminophen      Pt states she is unable to take this bc of Hashimoto Thyroid     Hydrocodone-Acetaminophen      REACTION: agitation     Oxycodone      Hallucinating, sweating, itching    Current Medications: Reviewed.  Current Outpatient Medications on File Prior to Visit  Medication Sig Dispense Refill   albuterol (PROVENTIL HFA) 108 (90 Base) MCG/ACT inhaler Inhale 1-2 puffs into the lungs every 4 (four) hours as needed for wheezing or shortness of breath. 1 Inhaler 3   albuterol (PROVENTIL) (2.5 MG/3ML) 0.083% nebulizer solution Take 3 mLs (2.5 mg total) by nebulization every 6 (six) hours as needed for wheezing or shortness of breath. 75 mL 6   amoxicillin-clavulanate (AUGMENTIN) 875-125 MG tablet Take 1 tablet by mouth 2 (two) times daily. 10 tablet 0   atorvastatin (LIPITOR) 40 MG tablet TAKE 1 TABLET (40 MG TOTAL) BY  MOUTH DAILY AT 6 PM. 90 tablet 3   azithromycin (ZITHROMAX) 250 MG tablet 2 tabs po x1 on Day 1, then 1 tab po daily on Days 2 - 5 6 tablet 0   fluticasone (FLONASE) 50 MCG/ACT nasal spray One spray in each nostril twice a day, use left hand for right nostril, and right hand for left nostril. 48 g 3   Fluticasone-Umeclidin-Vilant (TRELEGY ELLIPTA) 100-62.5-25 MCG/INH AEPB Inhale 1 puff into the lungs daily. 1 each 11   gabapentin (NEURONTIN) 800 MG tablet Take 1 tablet (800 mg total) by mouth 2 (two) times daily. 180 tablet 3   HYDROcodone-acetaminophen  (NORCO) 10-325 MG tablet Take 1 tablet by mouth 4 (four) times daily as needed.     ketotifen (ZADITOR) 0.025 % ophthalmic solution Place 1 drop into both eyes 2 (two) times daily. 5 mL 0   lamoTRIgine (LAMICTAL) 200 MG tablet TAKE 1 TABLET BY MOUTH EVERY DAY 90 tablet 0   levothyroxine (SYNTHROID) 150 MCG tablet TAKE 1 TABLET BY MOUTH DAILY BEFORE BREAKFAST. 90 tablet 1   PARoxetine (PAXIL) 20 MG tablet TAKE ONE AND HALF TABLETS BY MOUTH ONCE DAILY 135 tablet 0   Phenazopyridine HCl (AZO TABS PO) Take 1 tablet by mouth daily.     No current facility-administered medications on file prior to visit.       Family History: Reviewed.  Family History   Problem  Relation  Age of Onset     Hypertension  Father      Heart disease  Sister  4      heart attack      Depression  Brother      Cancer  Mother  45      colon    Psychiatric Specialty Exam:     Speech:  Clear and Coherent and Normal Rate  Volume:  Normal  Mood: fair  Affect: congruent  Thought Process:  clear  Orientation:  Full (Time, Place, and Person)  Thought Content:  WDL  Suicidal Thoughts:  No  Homicidal Thoughts:  No  Memory: fair  Judgement:  Good  Insight:  Fair  Psychomotor Activity:  decreased  Concentration:  Fair  Recall:  Poor  Akathisia:  No  Handed:  Right  Fund of knowledge-average to above average  Language-Intact  AIMS (if indicated):     Assets:  Manufacturing systems engineer Desire for Improvement Financial Resources/Insurance Housing Intimacy Leisure Time Physical Health Resilience Social Support Talents/Skills Transportation Vocational/Educational  Language intact   Fund of knowledge is good     Prior documentation and copy reviewed Assessment:  Major Depression, Recurrent severe  Alcohol Dependence in full sustained remission-stable AXIS I  Major Depression, Recurrent severe, Alcohol Dependence in full sustained remission . Fibromyalgia.     Treatment  Plan/Recommendations:   Major depression; baseline, pain effects mood, continue paxil, lamictal   Anxiety disorder: fluctuates, work on distraction, consider therapy, continue paxil Fibromyalgia;: feels pain, continue paxil and fu with primary care, on gabapentin Insomnia; irregular sleep, reviewed sleep hygiene, continue trazadone  Also to walk or distract from negative thoughts and to avoid bedroom during the day  Fu 44m. meds renewed  Thresa Ross, MD

## 2020-02-03 ENCOUNTER — Other Ambulatory Visit: Payer: Self-pay | Admitting: Sports Medicine

## 2020-02-03 ENCOUNTER — Other Ambulatory Visit (HOSPITAL_COMMUNITY): Payer: Self-pay | Admitting: Psychiatry

## 2020-02-03 DIAGNOSIS — F332 Major depressive disorder, recurrent severe without psychotic features: Secondary | ICD-10-CM

## 2020-02-03 DIAGNOSIS — E039 Hypothyroidism, unspecified: Secondary | ICD-10-CM

## 2020-02-13 ENCOUNTER — Other Ambulatory Visit: Payer: Self-pay

## 2020-02-13 DIAGNOSIS — H04203 Unspecified epiphora, bilateral lacrimal glands: Secondary | ICD-10-CM

## 2020-02-20 MED ORDER — KETOTIFEN FUMARATE 0.025 % OP SOLN: 1.0000 [drp] | Freq: Two times a day (BID) | OPHTHALMIC | 0 refills | Status: AC

## 2020-03-19 ENCOUNTER — Other Ambulatory Visit (HOSPITAL_COMMUNITY): Payer: Self-pay | Admitting: Psychiatry

## 2020-03-19 DIAGNOSIS — F332 Major depressive disorder, recurrent severe without psychotic features: Secondary | ICD-10-CM

## 2020-03-25 DIAGNOSIS — Z5181 Encounter for therapeutic drug level monitoring: Secondary | ICD-10-CM | POA: Diagnosis not present

## 2020-03-25 DIAGNOSIS — M47816 Spondylosis without myelopathy or radiculopathy, lumbar region: Secondary | ICD-10-CM | POA: Diagnosis not present

## 2020-03-25 DIAGNOSIS — M5030 Other cervical disc degeneration, unspecified cervical region: Secondary | ICD-10-CM | POA: Diagnosis not present

## 2020-03-25 DIAGNOSIS — Z79899 Other long term (current) drug therapy: Secondary | ICD-10-CM | POA: Diagnosis not present

## 2020-03-25 DIAGNOSIS — Z9889 Other specified postprocedural states: Secondary | ICD-10-CM | POA: Diagnosis not present

## 2020-03-25 DIAGNOSIS — M797 Fibromyalgia: Secondary | ICD-10-CM | POA: Diagnosis not present

## 2020-04-28 ENCOUNTER — Other Ambulatory Visit: Payer: Self-pay

## 2020-04-28 DIAGNOSIS — E039 Hypothyroidism, unspecified: Secondary | ICD-10-CM

## 2020-04-28 MED ORDER — LEVOTHYROXINE SODIUM 150 MCG PO TABS: 150.0000 ug | ORAL_TABLET | Freq: Every day | ORAL | 0 refills | Status: DC

## 2020-04-28 NOTE — Telephone Encounter (Signed)
Patient called to state that she is out of her levothyroxine and the pharmacy will not refill it. Per patient chart, she has not had her TSH checked since 2020. A 30 day supply was pended for refill to give patient time to get in for an appt.

## 2020-04-29 NOTE — Telephone Encounter (Signed)
Patient aware that she will need an appt for additional refills. She is not happy about this but understands that this is necessary.

## 2020-05-01 IMAGING — DX CHEST - 2 VIEW
2 series · 2 of 2 positions shown · non-contrast
Comparison: November 09, 2016

CLINICAL DATA: Chest pain and cough

EXAM:
CHEST - 2 VIEW

[chest pa]
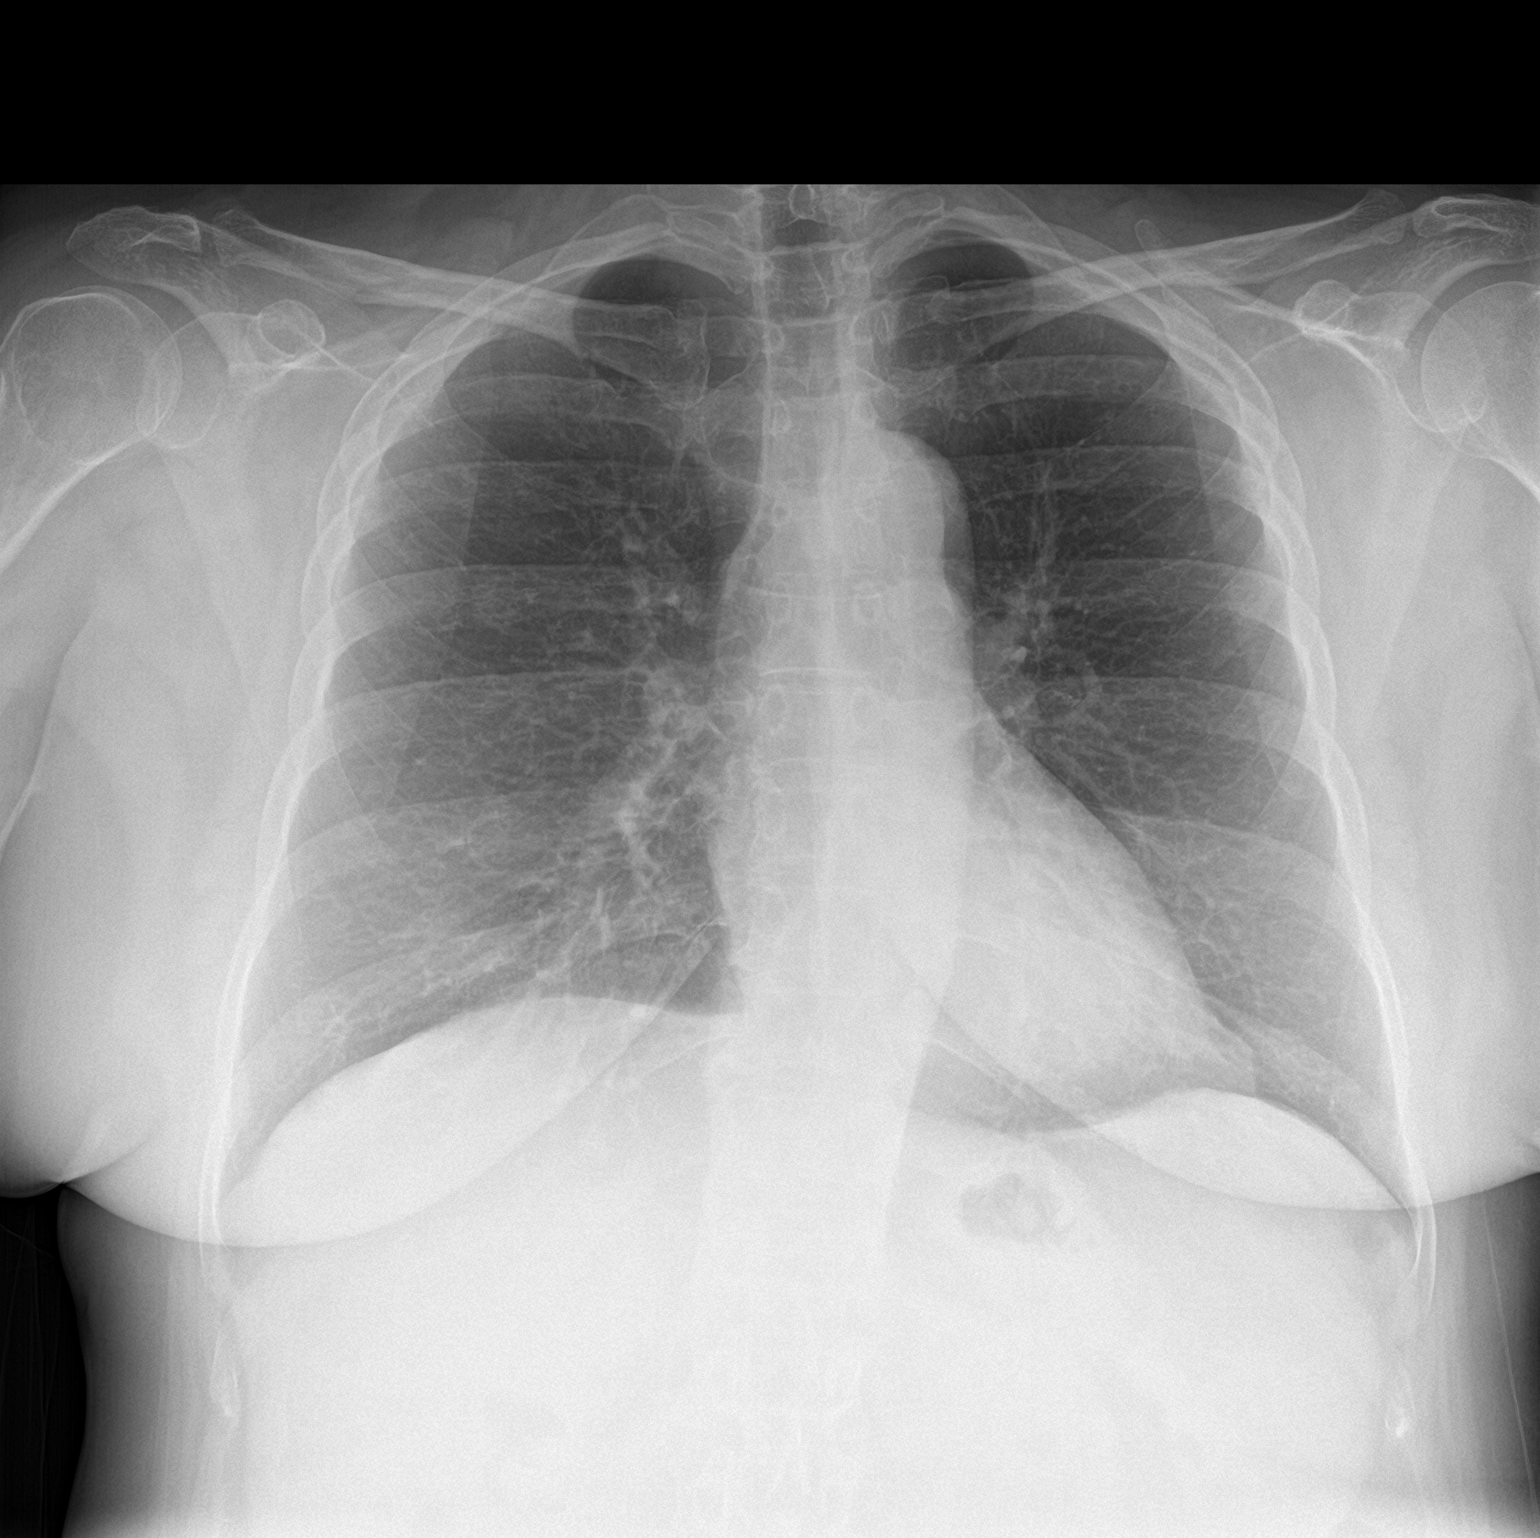

[chest lat]
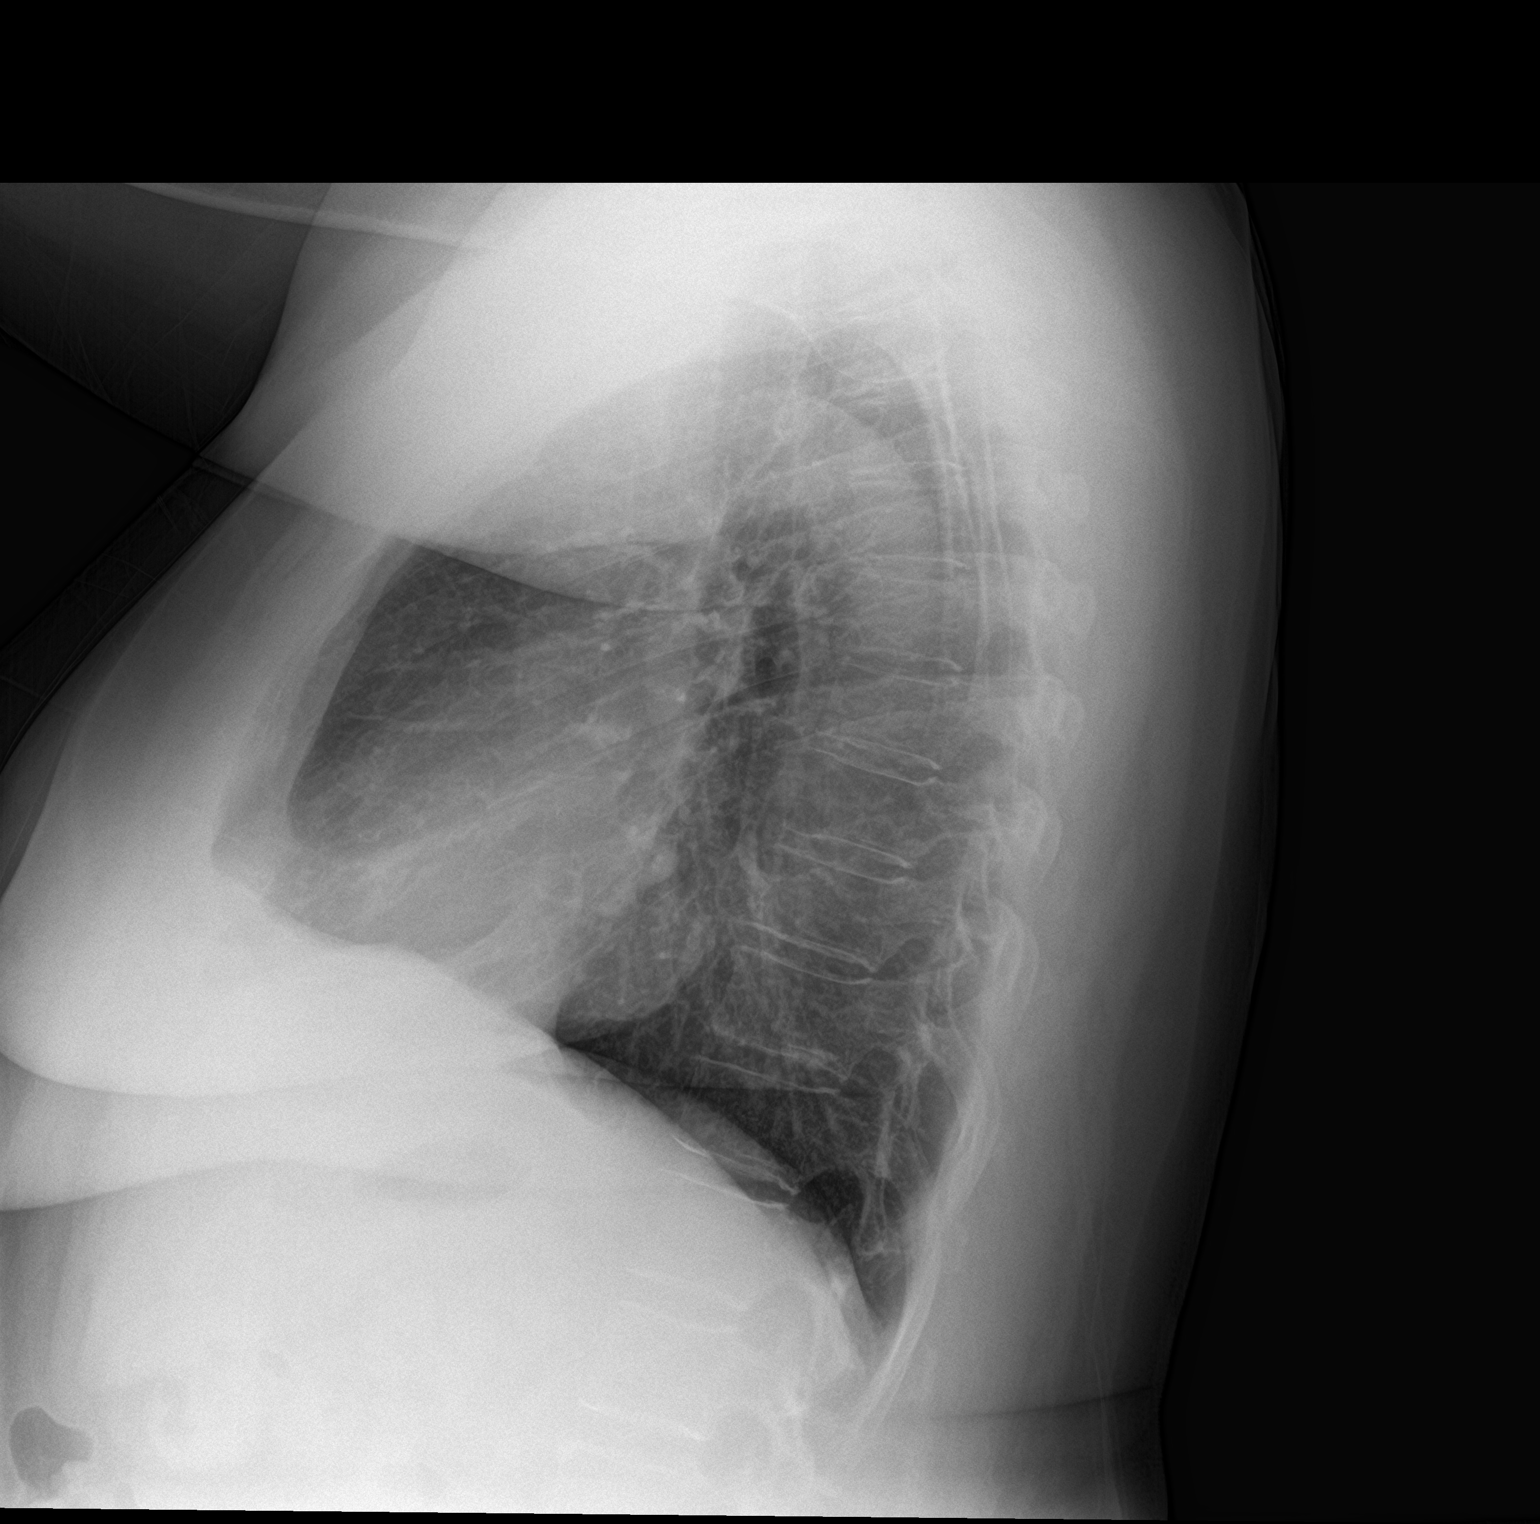

[2 of 2 positions shown; findings below may reference images not displayed]

FINDINGS: There is no edema or consolidation. Heart size and pulmonary
vascularity are normal. No adenopathy. No pneumothorax. No bone
lesions.
IMPRESSION: No edema or consolidation.

## 2020-05-21 ENCOUNTER — Telehealth (INDEPENDENT_AMBULATORY_CARE_PROVIDER_SITE_OTHER): Payer: BC Managed Care – PPO | Admitting: Psychiatry

## 2020-05-21 ENCOUNTER — Encounter (HOSPITAL_COMMUNITY): Payer: Self-pay | Admitting: Psychiatry

## 2020-05-21 DIAGNOSIS — F332 Major depressive disorder, recurrent severe without psychotic features: Secondary | ICD-10-CM | POA: Diagnosis not present

## 2020-05-21 DIAGNOSIS — F411 Generalized anxiety disorder: Secondary | ICD-10-CM

## 2020-05-21 MED ORDER — TRAZODONE HCL 100 MG PO TABS: ORAL_TABLET | ORAL | 0 refills | Status: DC

## 2020-05-21 MED ORDER — PAROXETINE HCL 20 MG PO TABS: ORAL_TABLET | ORAL | 0 refills | Status: DC

## 2020-05-21 NOTE — Progress Notes (Signed)
Patient ID: Jennifer [name], female   DOB: [birth year], 60 y.o.   MRN: [id number]   [facility] Follow-up Outpatient Visit Telepsych visit ASTRIA JORDAHL October 10, 1960  Date: 05/21/2020  Chief complaint : depression follow up   Virtual Visit via Telephone Note  I connected with Jennifer [name] on 05/21/20 at  4:00 PM EDT by telephone and verified that I am speaking with the correct person using two identifiers.  Location: Patient: home Provider: office   I discussed the limitations, risks, security and privacy concerns of performing an evaluation and management service by telephone and the availability of in person appointments. I also discussed with the patient that there may be a patient responsible charge related to this service. The patient expressed understanding and agreed to proceed.     I discussed the assessment and treatment plan with the patient. The patient was provided an opportunity to ask questions and all were answered. The patient agreed with the plan and demonstrated an understanding of the instructions.   The patient was advised to call back or seek an in-person evaluation if the symptoms worsen or if the condition fails to improve as anticipated.  I provided 12  minutes of non-face-to-face time during this encounter including documentation.  HPI   [name] is a 60  year old female with a diagnosis of major Depressive Disorder. 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   Has been doing fair mood wise but having sinus concerns for which reason somewhat down today  Overall medication to keep some balance in her mood symptoms including Lamictal and Paxil she does have aches related to fibromyalgia that she struggles with at time   Supportive husband    Depression: baseline  daughter is supportive      Review of Systems  Cardiovascular: Negative for chest pain.  Musculoskeletal: Positive for myalgias.  Psychiatric/Behavioral:  Negative for substance abuse and suicidal ideas.     Physical Exam  Vitals reviewed.  Constitutional: She appears well-developed and well-nourished. No distress.  Skin: She is not diaphoretic.     Past Medical History: Reviewed.  Past Medical History   Diagnosis  Date   .  Discoid lupus    .  Thyroid disease    .  Perimenopausal    .  Fibromyalgia    .  CFS (chronic fatigue syndrome)    .  Sleep apnea      Allergies: Reviewed.  Allergies   Allergen  Reactions   .  Acetaminophen      Pt states she is unable to take this bc of Hashimoto Thyroid   .  Hydrocodone-Acetaminophen      REACTION: agitation   .  Oxycodone      Hallucinating, sweating, itching    Current Medications: Reviewed.  Current Outpatient Medications on File Prior to Visit  Medication Sig Dispense Refill  . albuterol (PROVENTIL HFA) 108 (90 Base) MCG/ACT inhaler Inhale 1-2 puffs into the lungs every 4 (four) hours as needed for wheezing or shortness of breath. 1 Inhaler 3  . albuterol (PROVENTIL) (2.5 MG/3ML) 0.083% nebulizer solution Take 3 mLs (2.5 mg total) by nebulization every 6 (six) hours as needed for wheezing or shortness of breath. 75 mL 6  . amoxicillin-clavulanate (AUGMENTIN) 875-125 MG tablet Take 1 tablet by mouth 2 (two) times daily. 10 tablet 0  . atorvastatin (LIPITOR) 40 MG tablet TAKE 1 TABLET (40 MG TOTAL) BY MOUTH DAILY AT 6 PM. 90 tablet 3  . azithromycin (  ZITHROMAX) 250 MG tablet 2 tabs po x1 on Day 1, then 1 tab po daily on Days 2 - 5 6 tablet 0  . fluticasone (FLONASE) 50 MCG/ACT nasal spray One spray in each nostril twice a day, use left hand for right nostril, and right hand for left nostril. 48 g 3  . Fluticasone-Umeclidin-Vilant (TRELEGY ELLIPTA) 100-62.5-25 MCG/INH AEPB Inhale 1 puff into the lungs daily. 1 each 11  . gabapentin (NEURONTIN) 800 MG tablet Take 1 tablet (800 mg total) by mouth 2 (two) times daily. 180 tablet 3  . HYDROcodone-acetaminophen (NORCO) 10-325 MG tablet  Take 1 tablet by mouth 4 (four) times daily as needed.    Marland Kitchen ketotifen (ZADITOR) 0.025 % ophthalmic solution Place 1 drop into both eyes 2 (two) times daily. 5 mL 0  . lamoTRIgine (LAMICTAL) 200 MG tablet TAKE 1 TABLET BY MOUTH EVERY DAY 90 tablet 0  . levothyroxine (SYNTHROID) 150 MCG tablet Take 1 tablet (150 mcg total) by mouth daily before breakfast. 30 tablet 0  . Phenazopyridine HCl (AZO TABS PO) Take 1 tablet by mouth daily.     No current facility-administered medications on file prior to visit.       Family History: Reviewed.  Family History   Problem  Relation  Age of Onset   .  Hypertension  Father    .  Heart disease  Sister  45      heart attack    .  Depression  Brother    .  Cancer  Mother  73      colon    Psychiatric Specialty Exam:     Speech:  Clear and Coherent and Normal Rate  Volume:  Normal  Mood: fair  Affect: congruent  Thought Process:  clear  Orientation:  Full (Time, Place, and Person)  Thought Content:  WDL  Suicidal Thoughts:  No  Homicidal Thoughts:  No  Memory: fair  Judgement:  Good  Insight:  Fair  Psychomotor Activity:  decreased  Concentration:  Fair  Recall:  Poor  Akathisia:  No  Handed:  Right  Fund of knowledge-average to above average  Language-Intact  AIMS (if indicated):     Assets:  Manufacturing systems engineer Desire for Improvement Financial Resources/Insurance Housing Intimacy Leisure Time Physical Health Resilience Social Support Talents/Skills Transportation Vocational/Educational  Language intact   Fund of knowledge is good     Prior documentation and copy reviewed Assessment:  Major Depression, Recurrent severe  Alcohol Dependence in full sustained remission-stable AXIS I  Major Depression, Recurrent severe, Alcohol Dependence in full sustained remission . Fibromyalgia.     Treatment Plan/Recommendations:  Prior documentation reviewed Major depression; remains fair continue Paxil Lamictal  Anxiety  disorder: Fluctuates discussed stressors continue Paxil and work on distractions  fibromyalgia;: Baseline continue Paxil also on gabapentin by PCP  Insomnia; manageable on trazodone have to take 200 to 300 mg Follow-up in 3 months or earlier if needed reviewed medications   Thresa Ross, MD

## 2020-05-29 ENCOUNTER — Telehealth: Payer: BC Managed Care – PPO | Admitting: Sports Medicine

## 2020-06-17 ENCOUNTER — Other Ambulatory Visit (HOSPITAL_COMMUNITY): Payer: Self-pay | Admitting: Psychiatry

## 2020-06-17 DIAGNOSIS — F332 Major depressive disorder, recurrent severe without psychotic features: Secondary | ICD-10-CM

## 2020-07-16 DIAGNOSIS — M5030 Other cervical disc degeneration, unspecified cervical region: Secondary | ICD-10-CM | POA: Diagnosis not present

## 2020-07-16 DIAGNOSIS — M797 Fibromyalgia: Secondary | ICD-10-CM | POA: Diagnosis not present

## 2020-07-16 DIAGNOSIS — G894 Chronic pain syndrome: Secondary | ICD-10-CM | POA: Diagnosis not present

## 2020-07-16 DIAGNOSIS — M47816 Spondylosis without myelopathy or radiculopathy, lumbar region: Secondary | ICD-10-CM | POA: Diagnosis not present

## 2020-07-16 DIAGNOSIS — Z79899 Other long term (current) drug therapy: Secondary | ICD-10-CM | POA: Diagnosis not present

## 2020-07-16 DIAGNOSIS — Z5181 Encounter for therapeutic drug level monitoring: Secondary | ICD-10-CM | POA: Diagnosis not present

## 2020-07-17 ENCOUNTER — Telehealth: Payer: Self-pay

## 2020-07-17 ENCOUNTER — Encounter: Payer: Self-pay | Admitting: Sports Medicine

## 2020-07-17 NOTE — Telephone Encounter (Signed)
Patient called and asked for a Jury Duty Excuse letter be written so she doesn't have to serve. She knows that you don't return until July 19th. She wan't sure whether or not you would require an appt or not.

## 2020-07-17 NOTE — Telephone Encounter (Signed)
Letter written

## 2020-07-20 NOTE — Telephone Encounter (Signed)
Patient aware letter written. She requested it be mailed her to home. Letter prepared and placed in outgoing mail.

## 2020-08-04 ENCOUNTER — Telehealth (HOSPITAL_COMMUNITY): Payer: Self-pay | Admitting: *Deleted

## 2020-08-04 NOTE — Telephone Encounter (Signed)
Pt called --stated out of medication, needed refill Rx lomitrigine  200 mg until appt on 08/13/20. Please advise

## 2020-08-05 ENCOUNTER — Other Ambulatory Visit (HOSPITAL_COMMUNITY): Payer: Self-pay | Admitting: *Deleted

## 2020-08-05 DIAGNOSIS — F332 Major depressive disorder, recurrent severe without psychotic features: Secondary | ICD-10-CM

## 2020-08-05 MED ORDER — LAMOTRIGINE 200 MG PO TABS: 200.0000 mg | ORAL_TABLET | Freq: Every day | ORAL | 0 refills | Status: DC

## 2020-08-05 NOTE — Telephone Encounter (Signed)
Sent Rx Lomitrigine-verbal order by Dr. Orlin Hilding ([place])

## 2020-08-13 ENCOUNTER — Encounter (HOSPITAL_COMMUNITY): Payer: Self-pay | Admitting: Psychiatry

## 2020-08-13 ENCOUNTER — Telehealth (INDEPENDENT_AMBULATORY_CARE_PROVIDER_SITE_OTHER): Payer: BC Managed Care – PPO | Admitting: Psychiatry

## 2020-08-13 DIAGNOSIS — F332 Major depressive disorder, recurrent severe without psychotic features: Secondary | ICD-10-CM | POA: Diagnosis not present

## 2020-08-13 DIAGNOSIS — F5102 Adjustment insomnia: Secondary | ICD-10-CM

## 2020-08-13 DIAGNOSIS — M797 Fibromyalgia: Secondary | ICD-10-CM | POA: Diagnosis not present

## 2020-08-13 MED ORDER — PAROXETINE HCL 20 MG PO TABS: ORAL_TABLET | ORAL | 1 refills | Status: DC

## 2020-08-13 NOTE — Progress Notes (Signed)
Patient ID: Jennifer [name], female   DOB: 1960/07/25, 60 y.o.   MRN: [id number]   [facility] Follow-up Outpatient Visit Telepsych visit Jennifer [name] 03/27/1960  Date: 08/13/20  Chief complaint : depression follow up  Virtual Visit via Telephone Note  I connected with Jennifer [name] on 08/13/20 at  3:30 PM EDT by telephone and verified that I am speaking with the correct person using two identifiers.  Location: Patient: home Provider: office   I discussed the limitations, risks, security and privacy concerns of performing an evaluation and management service by telephone and the availability of in person appointments. I also discussed with the patient that there may be a patient responsible charge related to this service. The patient expressed understanding and agreed to proceed.      I discussed the assessment and treatment plan with the patient. The patient was provided an opportunity to ask questions and all were answered. The patient agreed with the plan and demonstrated an understanding of the instructions.   The patient was advised to call back or seek an in-person evaluation if the symptoms worsen or if the condition fails to improve as anticipated.  I provided 15 minutes of non-face-to-face time during this encounter.  HPI:    Ms. Morissette is a 60  year old female with a diagnosis of major Depressive Disorder. 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   Pain and myalgia effects mood, follows with pcp  At times uses wheelchiar, hot temperature has added to her fatigue and feeling subdued     Depression: baseline  Modifying factor: husband, daughter is supportive      Review of Systems  Cardiovascular:  Negative for chest pain.  Musculoskeletal:  Positive for myalgias.  Psychiatric/Behavioral:  Negative for substance abuse and suicidal ideas.     Physical Exam  Vitals reviewed.  Constitutional: She appears well-developed and  well-nourished. No distress.  Skin: She is not diaphoretic.     Past Medical History: Reviewed.  Past Medical History   Diagnosis  Date     Discoid lupus      Thyroid disease      Perimenopausal      Fibromyalgia      CFS (chronic fatigue syndrome)      Sleep apnea      Allergies: Reviewed.  Allergies   Allergen  Reactions     Acetaminophen      Pt states she is unable to take this bc of Hashimoto Thyroid     Hydrocodone-Acetaminophen      REACTION: agitation     Oxycodone      Hallucinating, sweating, itching    Current Medications: Reviewed.  Current Outpatient Medications on File Prior to Visit  Medication Sig Dispense Refill   albuterol (PROVENTIL HFA) 108 (90 Base) MCG/ACT inhaler Inhale 1-2 puffs into the lungs every 4 (four) hours as needed for wheezing or shortness of breath. 1 Inhaler 3   albuterol (PROVENTIL) (2.5 MG/3ML) 0.083% nebulizer solution Take 3 mLs (2.5 mg total) by nebulization every 6 (six) hours as needed for wheezing or shortness of breath. 75 mL 6   amoxicillin-clavulanate (AUGMENTIN) 875-125 MG tablet Take 1 tablet by mouth 2 (two) times daily. 10 tablet 0   atorvastatin (LIPITOR) 40 MG tablet TAKE 1 TABLET (40 MG TOTAL) BY MOUTH DAILY AT 6 PM. 90 tablet 3   azithromycin (ZITHROMAX) 250 MG tablet 2 tabs po x1 on Day 1, then 1 tab po daily on Days 2 -  5 6 tablet 0   fluticasone (FLONASE) 50 MCG/ACT nasal spray One spray in each nostril twice a day, use left hand for right nostril, and right hand for left nostril. 48 g 3   Fluticasone-Umeclidin-Vilant (TRELEGY ELLIPTA) 100-62.5-25 MCG/INH AEPB Inhale 1 puff into the lungs daily. 1 each 11   gabapentin (NEURONTIN) 800 MG tablet Take 1 tablet (800 mg total) by mouth 2 (two) times daily. 180 tablet 3   HYDROcodone-acetaminophen (NORCO) 10-325 MG tablet Take 1 tablet by mouth 4 (four) times daily as needed.     ketotifen (ZADITOR) 0.025 % ophthalmic solution Place 1 drop into both eyes 2 (two) times daily. 5  mL 0   lamoTRIgine (LAMICTAL) 200 MG tablet Take 1 tablet (200 mg total) by mouth daily. 90 tablet 0   levothyroxine (SYNTHROID) 150 MCG tablet Take 1 tablet (150 mcg total) by mouth daily before breakfast. 30 tablet 0   Phenazopyridine HCl (AZO TABS PO) Take 1 tablet by mouth daily.     traZODone (DESYREL) 100 MG tablet TAKE 2 TO 3 TABS AT NIGHT FOR SLEEP 270 tablet 0   No current facility-administered medications on file prior to visit.       Family History: Reviewed.  Family History   Problem  Relation  Age of Onset     Hypertension  Father      Heart disease  Sister  74      heart attack      Depression  Brother      Cancer  Mother  79      colon    Psychiatric Specialty Exam:     Speech:  Clear and Coherent and Normal Rate  Volume:  Normal  Mood: fair  Affect: congruent  Thought Process:  clear  Orientation:  Full (Time, Place, and Person)  Thought Content:  WDL  Suicidal Thoughts:  No  Homicidal Thoughts:  No  Memory: somewhat subdued  Judgement:  Good  Insight:  Fair  Psychomotor Activity:  decreased  Concentration:  Fair  Recall:  Poor  Akathisia:  No  Handed:  Right  Fund of knowledge-average to above average  Language-Intact  AIMS (if indicated):     Assets:  Manufacturing systems engineer Desire for Improvement Financial Resources/Insurance Housing Intimacy Leisure Time Physical Health Resilience Social Support Talents/Skills Transportation Vocational/Educational  Language intact   Fund of knowledge is good     Prior documentation and copy reviewed Assessment:  Major Depression, Recurrent severe  Alcohol Dependence in full sustained remission-stable AXIS I  Major Depression, Recurrent severe, Alcohol Dependence in full sustained remission . Fibromyalgia.     Treatment Plan/Recommendations:  Prior documentation reviewed  Major depression; subdued, continue lamictal, increase paxil to 40mg  a day from 30mg   Discussed to look for positive  things during the day to keep engaged Anxiety disorder: fluctuates, increase paxil  fibromyalgia;:flares up , on gaba pentin, will increase paxil    Insomnia; manageable on trazodone have to take 200 to 300 mg Follow-up in 28m or earlier if needed,    , MD

## 2020-09-04 ENCOUNTER — Telehealth: Payer: Self-pay | Admitting: Sports Medicine

## 2020-09-04 ENCOUNTER — Ambulatory Visit: Payer: BC Managed Care – PPO | Admitting: Sports Medicine

## 2020-09-04 NOTE — Telephone Encounter (Signed)
Patient calling in to cancel appointment due to being sick. Left voicemail at 2:14pm.

## 2020-09-07 ENCOUNTER — Other Ambulatory Visit: Payer: Self-pay | Admitting: Sports Medicine

## 2020-09-07 ENCOUNTER — Other Ambulatory Visit (HOSPITAL_COMMUNITY): Payer: Self-pay | Admitting: Psychiatry

## 2020-09-07 DIAGNOSIS — E785 Hyperlipidemia, unspecified: Secondary | ICD-10-CM

## 2020-09-07 DIAGNOSIS — F332 Major depressive disorder, recurrent severe without psychotic features: Secondary | ICD-10-CM

## 2020-09-21 ENCOUNTER — Ambulatory Visit (INDEPENDENT_AMBULATORY_CARE_PROVIDER_SITE_OTHER): Payer: Self-pay | Admitting: Sports Medicine

## 2020-09-21 ENCOUNTER — Other Ambulatory Visit: Payer: Self-pay

## 2020-09-21 VITALS — BP 138/88 | HR 84 | Ht 65.0 in | Wt 241.1 lb

## 2020-09-21 DIAGNOSIS — Z1231 Encounter for screening mammogram for malignant neoplasm of breast: Secondary | ICD-10-CM

## 2020-09-21 DIAGNOSIS — E785 Hyperlipidemia, unspecified: Secondary | ICD-10-CM

## 2020-09-21 DIAGNOSIS — Z1211 Encounter for screening for malignant neoplasm of colon: Secondary | ICD-10-CM

## 2020-09-21 DIAGNOSIS — J411 Mucopurulent chronic bronchitis: Secondary | ICD-10-CM

## 2020-09-21 DIAGNOSIS — G4730 Sleep apnea, unspecified: Secondary | ICD-10-CM

## 2020-09-21 DIAGNOSIS — M797 Fibromyalgia: Secondary | ICD-10-CM

## 2020-09-21 DIAGNOSIS — J3089 Other allergic rhinitis: Secondary | ICD-10-CM

## 2020-09-21 DIAGNOSIS — F332 Major depressive disorder, recurrent severe without psychotic features: Secondary | ICD-10-CM

## 2020-09-21 DIAGNOSIS — Z0000 Encounter for general adult medical examination without abnormal findings: Secondary | ICD-10-CM

## 2020-09-21 DIAGNOSIS — G2581 Restless legs syndrome: Secondary | ICD-10-CM

## 2020-09-21 DIAGNOSIS — Z23 Encounter for immunization: Secondary | ICD-10-CM

## 2020-09-21 MED ORDER — FLUTICASONE PROPIONATE 50 MCG/ACT NA SUSP: NASAL | 3 refills | Status: DC

## 2020-09-21 MED ORDER — NYSTATIN 100000 UNIT/ML MT SUSP: OROMUCOSAL | 11 refills | Status: DC

## 2020-09-21 NOTE — Assessment & Plan Note (Signed)
Stable, refilling Flonase. 

## 2020-09-21 NOTE — Assessment & Plan Note (Signed)
Routine physical as above, Shingrix and flu today, ordering mammogram, Cologuard. Nurse visit in 2 to 6 months for Shingrix #2.

## 2020-09-21 NOTE — Progress Notes (Signed)
Subjective:    CC: Annual Physical Exam  HPI:  This patient is here for their annual physical  I reviewed the past medical history, family history, social history, surgical history, and allergies today and no changes were needed.  Please see the problem list section below in epic for further details.  Past Medical History: Past Medical History:  Diagnosis Date   Alcohol abuse    Asthmatic bronchitis 2013   CFS (chronic fatigue syndrome)    Discoid lupus    Fibromyalgia    GERD (gastroesophageal reflux disease) 2013   Hyperlipidemia    Postartificial menopausal syndrome    Sleep apnea    Thyroid disease    Past Surgical History: Past Surgical History:  Procedure Laterality Date   ABDOMINAL HYSTERECTOMY  09/2007   TAH with bladder sling   NASAL SEPTUM SURGERY     orto surgery plate in leg     TUBAL LIGATION     bilateral   uvuloplasty     Social History: Social History   Socioeconomic History   Marital status: Married    Spouse name: Not on file   Number of children: 4   Years of education: Not on file   Highest education level: Not on file  Occupational History   Occupation: homemaker    Employer: UNEMPLOYED  Tobacco Use   Smoking status: Former    Packs/day: 0.30    Years: 36.00    Pack years: 10.80    Types: Cigarettes   Smokeless tobacco: Never   Tobacco comments:    Started smoking at age 86.  Vaping Use   Vaping Use: Never used  Substance and Sexual Activity   Alcohol use: No    Comment: quit 01-2006/prior alcoholic-in AA   Drug use: No   Sexual activity: Never  Other Topics Concern   Not on file  Social History Narrative   Not on file   Social Determinants of Health   Financial Resource Strain: Not on file  Food Insecurity: Not on file  Transportation Needs: Not on file  Physical Activity: Not on file  Stress: Not on file  Social Connections: Not on file   Family History: Family History  Problem Relation Age of Onset   Hypertension  Father    Alcohol abuse Father    Heart disease Sister 47       heart attack   Heart attack Sister    Depression Brother    Alcohol abuse Brother        In remission   Cancer Mother 47       colon   Depression Brother    Alcohol abuse Maternal Aunt    Alcohol abuse Paternal Uncle        in remission   Bipolar disorder Daughter 57       Suicide attempt   Allergies: Allergies  Allergen Reactions   Prednisone Other (See Comments)    Hallucinations October 2014   Hydrocodone-Acetaminophen Other (See Comments)    agitation   Oxycodone Itching and Other (See Comments)    Hallucinating, sweating, itching Pt states she can take this in small doses   Medications: See med rec.  Review of Systems: No headache, visual changes, nausea, vomiting, diarrhea, constipation, dizziness, abdominal pain, skin rash, fevers, chills, night sweats, swollen lymph nodes, weight loss, chest pain, body aches, joint swelling, muscle aches, shortness of breath, mood changes, visual or auditory hallucinations.  Objective:    General: Well Developed, well nourished, and in  no acute distress.  Neuro: Alert and oriented x3, extra-ocular muscles intact, sensation grossly intact. Cranial nerves II through XII are intact, motor, sensory, and coordinative functions are all intact. HEENT: Normocephalic, atraumatic, pupils equal round reactive to light, neck supple, no masses, no lymphadenopathy, thyroid nonpalpable. Oropharynx, nasopharynx, external ear canals are unremarkable. Skin: Warm and dry, no rashes noted.  Cardiac: Regular rate and rhythm, no murmurs rubs or gallops.  Respiratory: Clear to auscultation bilaterally. Not using accessory muscles, speaking in full sentences.  Abdominal: Soft, nontender, nondistended, positive bowel sounds, no masses, no organomegaly.  Musculoskeletal: Shoulder, elbow, wrist, hip, knee, ankle stable, and with full range of motion.  Impression and Recommendations:    The  patient was counselled, risk factors were discussed, anticipatory guidance given.  Annual physical exam Routine physical as above, Shingrix and flu today, ordering mammogram, Cologuard. Nurse visit in 2 to 6 months for Shingrix #2.  Major depressive disorder, recurrent episode, severe We did discuss Adriyanna's severe fatigue, I explained to her that there were multiple possible causes, there is certainly concurrent and uncontrolled mood disorder, she was very tearful in the exam room. This will be managed by Dr. Gilmore Laroche with psychiatry, see other notes for further discussion of chronic fatigue.  Sleep apnea Continues to complain of severe fatigue, she does have a history of mild obstructive sleep apnea with an AHI of 5 with a sleep study a decade ago, we will do some routine labs and if insufficient explanation for her fatigue we will pull the trigger for another home sleep study. She is also interested in losing a great deal of weight, this will probably need bariatric surgery.  Restless leg syndrome Continue with gabapentin.  Perennial allergic rhinitis Stable, refilling Flonase.  Bronchitis, mucopurulent recurrent (HCC) Normal spirometry several times, persistent cough, likely some degree of chronic bronchitis improved with Trelegy, and occasional course of prednisone, she has had normal chest x-rays, D-dimers, continue Trelegy for now as this seems to help her symptoms, she does have an occasional thrush, she does plan to get more diligent with switching out her mouth after using the Trelegy inhaler, I will add some nystatin to use as needed if she gets thrush.  Fibromyalgia with chronic fatigue Bess does have classic fibromyalgia with chronic fatigue syndrome, we will do another work-up for reversible causes such as anemia, hypothyroidism, low vitamin B12, sleep apnea. I do think her depression could use better control, ultimately we may need to use a  stimulant.   ___________________________________________ Jennifer [name]. Benjamin Stain, M.D., ABFM., CAQSM. Primary Care and Sports Medicine [facility]  Adjunct Professor of Family Medicine  [employer]

## 2020-09-21 NOTE — Assessment & Plan Note (Signed)
Jacoba does have classic fibromyalgia with chronic fatigue syndrome, we will do another work-up for reversible causes such as anemia, hypothyroidism, low vitamin B12, sleep apnea. I do think her depression could use better control, ultimately we may need to use a stimulant.

## 2020-09-21 NOTE — Assessment & Plan Note (Signed)
Continue with gabapentin 

## 2020-09-21 NOTE — Assessment & Plan Note (Signed)
Continues to complain of severe fatigue, she does have a history of mild obstructive sleep apnea with an AHI of 5 with a sleep study a decade ago, we will do some routine labs and if insufficient explanation for her fatigue we will pull the trigger for another home sleep study. She is also interested in losing a great deal of weight, this will probably need bariatric surgery.

## 2020-09-21 NOTE — Assessment & Plan Note (Signed)
We did discuss Jennifer [name]'s severe fatigue, I explained to her that there were multiple possible causes, there is certainly concurrent and uncontrolled mood disorder, she was very tearful in the exam room. This will be managed by Dr. Gilmore Laroche with psychiatry, see other notes for further discussion of chronic fatigue.

## 2020-09-21 NOTE — Assessment & Plan Note (Signed)
Normal spirometry several times, persistent cough, likely some degree of chronic bronchitis improved with Trelegy, and occasional course of prednisone, she has had normal chest x-rays, D-dimers, continue Trelegy for now as this seems to help her symptoms, she does have an occasional thrush, she does plan to get more diligent with switching out her mouth after using the Trelegy inhaler, I will add some nystatin to use as needed if she gets thrush.

## 2020-09-28 ENCOUNTER — Other Ambulatory Visit: Payer: Self-pay | Admitting: Sports Medicine

## 2020-09-28 DIAGNOSIS — E039 Hypothyroidism, unspecified: Secondary | ICD-10-CM

## 2020-10-22 DIAGNOSIS — M5030 Other cervical disc degeneration, unspecified cervical region: Secondary | ICD-10-CM | POA: Diagnosis not present

## 2020-10-22 DIAGNOSIS — G894 Chronic pain syndrome: Secondary | ICD-10-CM | POA: Diagnosis not present

## 2020-10-22 DIAGNOSIS — M47816 Spondylosis without myelopathy or radiculopathy, lumbar region: Secondary | ICD-10-CM | POA: Diagnosis not present

## 2020-10-22 DIAGNOSIS — M797 Fibromyalgia: Secondary | ICD-10-CM | POA: Diagnosis not present

## 2020-10-27 ENCOUNTER — Telehealth (HOSPITAL_COMMUNITY): Payer: BC Managed Care – PPO | Admitting: Psychiatry

## 2020-10-30 ENCOUNTER — Telehealth (INDEPENDENT_AMBULATORY_CARE_PROVIDER_SITE_OTHER): Payer: Self-pay | Admitting: Psychiatry

## 2020-10-30 ENCOUNTER — Encounter (HOSPITAL_COMMUNITY): Payer: Self-pay | Admitting: Psychiatry

## 2020-10-30 DIAGNOSIS — M797 Fibromyalgia: Secondary | ICD-10-CM

## 2020-10-30 DIAGNOSIS — F332 Major depressive disorder, recurrent severe without psychotic features: Secondary | ICD-10-CM

## 2020-10-30 DIAGNOSIS — F411 Generalized anxiety disorder: Secondary | ICD-10-CM

## 2020-10-30 MED ORDER — TRAZODONE HCL 100 MG PO TABS: ORAL_TABLET | ORAL | 0 refills | Status: DC

## 2020-10-30 MED ORDER — PAROXETINE HCL 20 MG PO TABS: ORAL_TABLET | ORAL | 1 refills | Status: DC

## 2020-10-30 NOTE — Progress Notes (Signed)
Patient ID: Jennifer [name], female   DOB: March 18, 1960, 59 y.o.   MRN: [id number]   [facility] Follow-up Outpatient Visit Telepsych visit [name] 05-04-1960  Date: 10/30/20  Chief complaint : depression follow up Virtual Visit via Telephone Note  I connected with Ewing Schlein on 10/30/20 at  1:00 PM EDT by telephone and verified that I am speaking with the correct person using two identifiers.  Location: Patient: home Provider: home office   I discussed the limitations, risks, security and privacy concerns of performing an evaluation and management service by telephone and the availability of in person appointments. I also discussed with the patient that there may be a patient responsible charge related to this service. The patient expressed understanding and agreed to proceed.     I discussed the assessment and treatment plan with the patient. The patient was provided an opportunity to ask questions and all were answered. The patient agreed with the plan and demonstrated an understanding of the instructions.   The patient was advised to call back or seek an in-person evaluation if the symptoms worsen or if the condition fails to improve as anticipated.  I provided 12 minutes of non-face-to-face time during this encounter.        HPI:    Ms. Jennifer [name] is a 60  year old female with a diagnosis of major Depressive Disorder. 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   Pain and myalgia effects mood, follows with pcp Last visit was feeling subdued, we increased paxil to 40mg  that has helped Depression better, still myalgia effects mood but managing it. Also on gaba   Depression: baseline  Modifying factor: husband, daughter is supportive      Review of Systems  Cardiovascular:  Negative for chest pain.  Musculoskeletal:  Positive for myalgias.  Psychiatric/Behavioral:  Negative for substance abuse and suicidal ideas.     Physical Exam   Vitals reviewed.  Constitutional: She appears well-developed and well-nourished. No distress.  Skin: She is not diaphoretic.     Past Medical History: Reviewed.  Past Medical History   Diagnosis  Date     Discoid lupus      Thyroid disease      Perimenopausal      Fibromyalgia      CFS (chronic fatigue syndrome)      Sleep apnea      Allergies: Reviewed.  Allergies   Allergen  Reactions     Acetaminophen      Pt states she is unable to take this bc of Hashimoto Thyroid     Hydrocodone-Acetaminophen      REACTION: agitation     Oxycodone      Hallucinating, sweating, itching    Current Medications: Reviewed.  Current Outpatient Medications on File Prior to Visit  Medication Sig Dispense Refill   albuterol (PROVENTIL HFA) 108 (90 Base) MCG/ACT inhaler Inhale 1-2 puffs into the lungs every 4 (four) hours as needed for wheezing or shortness of breath. 1 Inhaler 3   albuterol (PROVENTIL) (2.5 MG/3ML) 0.083% nebulizer solution Take 3 mLs (2.5 mg total) by nebulization every 6 (six) hours as needed for wheezing or shortness of breath. 75 mL 6   atorvastatin (LIPITOR) 40 MG tablet Take 1 tablet (40 mg total) by mouth daily at 6 PM. Patient needs an appt. 30 tablet 0   fluticasone (FLONASE) 50 MCG/ACT nasal spray One spray in each nostril twice a day, use left hand for right nostril, and right hand  for left nostril. 48 g 3   Fluticasone-Umeclidin-Vilant (TRELEGY ELLIPTA) 100-62.5-25 MCG/INH AEPB Inhale 1 puff into the lungs daily. 1 each 11   gabapentin (NEURONTIN) 800 MG tablet Take 1 tablet (800 mg total) by mouth 2 (two) times daily. 180 tablet 3   HYDROcodone-acetaminophen (NORCO) 10-325 MG tablet Take 1 tablet by mouth 4 (four) times daily as needed.     ketotifen (ZADITOR) 0.025 % ophthalmic solution Place 1 drop into both eyes 2 (two) times daily. 5 mL 0   lamoTRIgine (LAMICTAL) 200 MG tablet Take 1 tablet (200 mg total) by mouth daily. 90 tablet 0   levothyroxine (SYNTHROID)  150 MCG tablet Take 1 tablet (150 mcg total) by mouth daily before breakfast. Needs labs. 30 tablet 0   nystatin (MYCOSTATIN) 100000 UNIT/ML suspension Swish and spit 5 mL 4 times daily if thrush develops 60 mL 11   Phenazopyridine HCl (AZO TABS PO) Take 1 tablet by mouth daily.     No current facility-administered medications on file prior to visit.       Family History: Reviewed.  Family History   Problem  Relation  Age of Onset     Hypertension  Father      Heart disease  Sister  3      heart attack      Depression  Brother      Cancer  Mother  35      colon    Psychiatric Specialty Exam:     Speech:  Clear and Coherent and Normal Rate  Volume:  Normal  Mood: fair  Affect:   Thought Process:  clear  Orientation:  Full (Time, Place, and Person)  Thought Content:  WDL  Suicidal Thoughts:  No  Homicidal Thoughts:  No  Memory: somewhat subdued  Judgement:  Good  Insight:  Fair  Psychomotor Activity:  decreased  Concentration:  Fair  Recall:  Poor  Akathisia:  No  Handed:  Right  Fund of knowledge-average to above average  Language-Intact  AIMS (if indicated):     Assets:  Communication Skills Desire for Improvement Financial Resources/Insurance Housing Intimacy Leisure Time Physical Health Resilience Social Support Talents/Skills Transportation Vocational/Educational  Language intact   Fund of knowledge is good     Prior documentation and copy reviewed Assessment:  Major Depression, Recurrent severe  Alcohol Dependence in full sustained remission-stable AXIS I  Major Depression, Recurrent severe, Alcohol Dependence in full sustained remission . Fibromyalgia.     Treatment Plan/Recommendations:  Prior documentation reviewed  Major depression; fair, continue paxil 40mg  And lamcital Discussed to look for positive things during the day to keep engaged Anxiety disorder: fluctuates, distract from negative thoughts, continue paxil  90m   fibromyalgia;:flares up at times, paxil increase has helped   Insomnia; manageable on trazodone have to take 200 to 300 mg Follow-up in 66m or earlier if needed,    3m, MD

## 2020-10-31 ENCOUNTER — Other Ambulatory Visit (HOSPITAL_COMMUNITY): Payer: Self-pay | Admitting: Psychiatry

## 2020-10-31 DIAGNOSIS — F332 Major depressive disorder, recurrent severe without psychotic features: Secondary | ICD-10-CM

## 2020-11-09 ENCOUNTER — Other Ambulatory Visit: Payer: Self-pay | Admitting: Sports Medicine

## 2020-11-09 DIAGNOSIS — E785 Hyperlipidemia, unspecified: Secondary | ICD-10-CM

## 2020-12-08 ENCOUNTER — Other Ambulatory Visit: Payer: Self-pay | Admitting: Sports Medicine

## 2020-12-08 DIAGNOSIS — E039 Hypothyroidism, unspecified: Secondary | ICD-10-CM

## 2020-12-08 NOTE — Telephone Encounter (Signed)
Spoke with patient, she stated she is coming this week to get labs done.

## 2020-12-17 DIAGNOSIS — M797 Fibromyalgia: Secondary | ICD-10-CM | POA: Diagnosis not present

## 2020-12-17 DIAGNOSIS — Z79899 Other long term (current) drug therapy: Secondary | ICD-10-CM | POA: Diagnosis not present

## 2020-12-17 DIAGNOSIS — M5030 Other cervical disc degeneration, unspecified cervical region: Secondary | ICD-10-CM | POA: Diagnosis not present

## 2020-12-17 DIAGNOSIS — Z5181 Encounter for therapeutic drug level monitoring: Secondary | ICD-10-CM | POA: Diagnosis not present

## 2020-12-17 DIAGNOSIS — M47816 Spondylosis without myelopathy or radiculopathy, lumbar region: Secondary | ICD-10-CM | POA: Diagnosis not present

## 2020-12-17 DIAGNOSIS — G894 Chronic pain syndrome: Secondary | ICD-10-CM | POA: Diagnosis not present

## 2020-12-21 ENCOUNTER — Other Ambulatory Visit (HOSPITAL_COMMUNITY): Payer: Self-pay

## 2020-12-21 DIAGNOSIS — F332 Major depressive disorder, recurrent severe without psychotic features: Secondary | ICD-10-CM

## 2020-12-21 MED ORDER — TRAZODONE HCL 100 MG PO TABS: ORAL_TABLET | ORAL | 0 refills | Status: DC

## 2020-12-30 ENCOUNTER — Telehealth (HOSPITAL_COMMUNITY): Payer: Self-pay

## 2020-12-30 MED ORDER — LAMOTRIGINE 200 MG PO TABS: 200.0000 mg | ORAL_TABLET | Freq: Every day | ORAL | 0 refills | Status: DC

## 2020-12-30 NOTE — Telephone Encounter (Signed)
Patient says that she is out of Lamictal. I spoke with pharmacy and they said patient picked up 90 days supply on 10/25/20 and has a refill on file at their store. Patient says she does not know what happened with the pills, she did not take more than what she was supposed to. Patient wants to know if you can send enough to last her until she see you on 01/27 to the pharmacy and she will pay out of pocket. [facility] [place] and add she can pick up early refill. Please advisie

## 2020-12-30 NOTE — Telephone Encounter (Signed)
I sent over a 30 day supply and informed patient that she would have to pay out of pocket because insurance will not pay for it until next month. She stated her understanding and I told her to call if she has any issue with picking up the medication

## 2020-12-30 NOTE — Telephone Encounter (Signed)
Patient called back and said that she spoke with the head pharmacist and he did some investigation and found out they did not give her the full 90 day supply on 10/23.

## 2021-01-09 ENCOUNTER — Other Ambulatory Visit (HOSPITAL_COMMUNITY): Payer: Self-pay | Admitting: Psychiatry

## 2021-01-09 DIAGNOSIS — F332 Major depressive disorder, recurrent severe without psychotic features: Secondary | ICD-10-CM

## 2021-01-11 ENCOUNTER — Other Ambulatory Visit: Payer: Self-pay | Admitting: Sports Medicine

## 2021-01-11 DIAGNOSIS — E039 Hypothyroidism, unspecified: Secondary | ICD-10-CM

## 2021-01-24 ENCOUNTER — Other Ambulatory Visit: Payer: Self-pay | Admitting: Sports Medicine

## 2021-01-24 DIAGNOSIS — E039 Hypothyroidism, unspecified: Secondary | ICD-10-CM

## 2021-01-25 MED ORDER — LEVOTHYROXINE SODIUM 150 MCG PO TABS: 150.0000 ug | ORAL_TABLET | Freq: Every day | ORAL | 0 refills | Status: DC

## 2021-01-26 ENCOUNTER — Other Ambulatory Visit: Payer: Self-pay | Admitting: Sports Medicine

## 2021-01-26 DIAGNOSIS — R0602 Shortness of breath: Secondary | ICD-10-CM

## 2021-01-29 ENCOUNTER — Encounter (HOSPITAL_COMMUNITY): Payer: Self-pay | Admitting: Psychiatry

## 2021-01-29 ENCOUNTER — Telehealth (INDEPENDENT_AMBULATORY_CARE_PROVIDER_SITE_OTHER): Payer: Self-pay | Admitting: Psychiatry

## 2021-01-29 DIAGNOSIS — F411 Generalized anxiety disorder: Secondary | ICD-10-CM

## 2021-01-29 DIAGNOSIS — F332 Major depressive disorder, recurrent severe without psychotic features: Secondary | ICD-10-CM

## 2021-01-29 DIAGNOSIS — M797 Fibromyalgia: Secondary | ICD-10-CM

## 2021-01-29 NOTE — Progress Notes (Signed)
Patient ID: IPEK WESTRA, female   DOB: Apr 15, 1960, 61 y.o.   MRN: [id number]   [facility] Follow-up Outpatient Visit Telepsych visit MAYOLA MCBAIN [birth year]  Date: 11/29/21  Virtual Visit via Telephone Note  I connected with Ewing Schlein on 01/29/21 at 12:30 PM EST by telephone and verified that I am speaking with the correct person using two identifiers.  Location: Patient: HOME Provider: HOME OFFICE   I discussed the limitations, risks, security and privacy concerns of performing an evaluation and management service by telephone and the availability of in person appointments. I also discussed with the patient that there may be a patient responsible charge related to this service. The patient expressed understanding and agreed to proceed.      I discussed the assessment and treatment plan with the patient. The patient was provided an opportunity to ask questions and all were answered. The patient agreed with the plan and demonstrated an understanding of the instructions.   The patient was advised to call back or seek an in-person evaluation if the symptoms worsen or if the condition fails to improve as anticipated.  I provided 15 minutes of non-face-to-face time during this encounter including chart review documentation   Thresa Ross, MD  Chief complaint : depression follow up        HPI:    Ms. Tavares is a 61  year old female with a diagnosis of major Depressive Disorder. 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   Doing fair but myalgia can effect for days at times Feel med keep some balance, thinking of getting a pet Lamictal paxil helps, paxil now 40mg    Depression: baseline  Modifying factor:husband Severity fluctuate      Review of Systems  Cardiovascular:  Negative for chest pain.  Musculoskeletal:  Positive for myalgias.  Psychiatric/Behavioral:  Negative for substance abuse and suicidal ideas.     Physical Exam   Vitals reviewed.  Constitutional: She appears well-developed and well-nourished. No distress.  Skin: She is not diaphoretic.     Past Medical History: Reviewed.  Past Medical History   Diagnosis  Date     Discoid lupus      Thyroid disease      Perimenopausal      Fibromyalgia      CFS (chronic fatigue syndrome)      Sleep apnea      Allergies: Reviewed.  Allergies   Allergen  Reactions     Acetaminophen      Pt states she is unable to take this bc of Hashimoto Thyroid     Hydrocodone-Acetaminophen      REACTION: agitation     Oxycodone      Hallucinating, sweating, itching    Current Medications: Reviewed.  Current Outpatient Medications on File Prior to Visit  Medication Sig Dispense Refill   albuterol (PROVENTIL HFA) 108 (90 Base) MCG/ACT inhaler Inhale 1-2 puffs into the lungs every 4 (four) hours as needed for wheezing or shortness of breath. 1 Inhaler 3   albuterol (PROVENTIL) (2.5 MG/3ML) 0.083% nebulizer solution Take 3 mLs (2.5 mg total) by nebulization every 6 (six) hours as needed for wheezing or shortness of breath. 75 mL 6   atorvastatin (LIPITOR) 40 MG tablet Take 1 tablet (40 mg total) by mouth daily at 6 PM. 30 tablet 5   fluticasone (FLONASE) 50 MCG/ACT nasal spray One spray in each nostril twice a day, use left hand for right nostril, and right hand for left nostril.  48 g 3   gabapentin (NEURONTIN) 800 MG tablet Take 1 tablet (800 mg total) by mouth 2 (two) times daily. 180 tablet 3   HYDROcodone-acetaminophen (NORCO) 10-325 MG tablet Take 1 tablet by mouth 4 (four) times daily as needed.     ketotifen (ZADITOR) 0.025 % ophthalmic solution Place 1 drop into both eyes 2 (two) times daily. 5 mL 0   lamoTRIgine (LAMICTAL) 200 MG tablet Take 1 tablet (200 mg total) by mouth daily. 30 tablet 0   levothyroxine (SYNTHROID) 150 MCG tablet Take 1 tablet (150 mcg total) by mouth daily before breakfast. Needs labs. 15 tablet 0   nystatin (MYCOSTATIN) 100000 UNIT/ML  suspension Swish and spit 5 mL 4 times daily if thrush develops 60 mL 11   PARoxetine (PAXIL) 20 MG tablet TAKE 2 TABLETS (40MG ) BY MOUTH ONCE A DAY KEEP 30 DAYS SUPPLY AS NOT YET DECIDED ABOUT MAINTENANCE DOSEOR ADJUSTMENT 60 tablet 1   Phenazopyridine HCl (AZO TABS PO) Take 1 tablet by mouth daily.     traZODone (DESYREL) 100 MG tablet TAKE 2 TO 3 TABS AT NIGHT FOR SLEEP 270 tablet 0   TRELEGY ELLIPTA 100-62.5-25 MCG/ACT AEPB TAKE 1 PUFF BY MOUTH EVERY DAY 60 each 11   No current facility-administered medications on file prior to visit.       Family History: Reviewed.  Family History   Problem  Relation  Age of Onset     Hypertension  Father      Heart disease  Sister  70      heart attack      Depression  Brother      Cancer  Mother  14      colon    Psychiatric Specialty Exam:     Speech:  Clear and Coherent and Normal Rate  Volume:  Normal  Mood: fair  Affect:   Thought Process:  clear  Orientation:  Full (Time, Place, and Person)  Thought Content:  WDL  Suicidal Thoughts:  No  Homicidal Thoughts:  No  Memory: somewhat subdued  Judgement:  Good  Insight:  Fair  Psychomotor Activity:  decreased  Concentration:  Fair  Recall:  Poor  Akathisia:  No  Handed:  Right  Fund of knowledge-average to above average  Language-Intact  AIMS (if indicated):     Assets:  Communication Skills Desire for Improvement Financial Resources/Insurance Housing Intimacy Leisure Time Physical Health Resilience Social Support Talents/Skills Transportation Vocational/Educational  Language intact   Fund of knowledge is good     Prior documentation and copy reviewed Assessment:  Major Depression, Recurrent severe  Alcohol Dependence in full sustained remission-stable AXIS I  Major Depression, Recurrent severe, Alcohol Dependence in full sustained remission . Fibromyalgia.     Treatment Plan/Recommendations:   Prior documentation reviewed   Major depression; gets  subdued, meds help will continue lamictal, paxil  Discussed to look for positive things during the day to keep engaged Anxiety disorder:gets anxious when myalgia flares, continue gaba from pcp also on paxil provided supportive therapy May get a pet   fibromyalgia;:flares up at times, paxil increase has helped   Insomnia; manageable on trazadone, will continue  Fu 15m  1m, MD

## 2021-02-01 ENCOUNTER — Other Ambulatory Visit: Payer: Self-pay | Admitting: Sports Medicine

## 2021-02-01 ENCOUNTER — Other Ambulatory Visit (HOSPITAL_COMMUNITY): Payer: Self-pay

## 2021-02-01 DIAGNOSIS — M797 Fibromyalgia: Secondary | ICD-10-CM

## 2021-02-01 MED ORDER — LAMOTRIGINE 200 MG PO TABS: 200.0000 mg | ORAL_TABLET | Freq: Every day | ORAL | 0 refills | Status: DC

## 2021-02-03 ENCOUNTER — Other Ambulatory Visit (HOSPITAL_COMMUNITY): Payer: Self-pay | Admitting: Psychiatry

## 2021-02-03 DIAGNOSIS — F332 Major depressive disorder, recurrent severe without psychotic features: Secondary | ICD-10-CM

## 2021-02-15 DIAGNOSIS — G894 Chronic pain syndrome: Secondary | ICD-10-CM | POA: Diagnosis not present

## 2021-02-15 DIAGNOSIS — M797 Fibromyalgia: Secondary | ICD-10-CM | POA: Diagnosis not present

## 2021-02-15 DIAGNOSIS — M47816 Spondylosis without myelopathy or radiculopathy, lumbar region: Secondary | ICD-10-CM | POA: Diagnosis not present

## 2021-02-15 DIAGNOSIS — M5030 Other cervical disc degeneration, unspecified cervical region: Secondary | ICD-10-CM | POA: Diagnosis not present

## 2021-02-15 DIAGNOSIS — E785 Hyperlipidemia, unspecified: Secondary | ICD-10-CM | POA: Diagnosis not present

## 2021-02-15 DIAGNOSIS — H8110 Benign paroxysmal vertigo, unspecified ear: Secondary | ICD-10-CM | POA: Diagnosis not present

## 2021-02-16 ENCOUNTER — Encounter: Payer: Self-pay | Admitting: Sports Medicine

## 2021-02-16 ENCOUNTER — Other Ambulatory Visit: Payer: Self-pay | Admitting: Sports Medicine

## 2021-02-16 DIAGNOSIS — E039 Hypothyroidism, unspecified: Secondary | ICD-10-CM

## 2021-02-16 LAB — TSH: TSH: 56.6 mIU/L — ABNORMAL HIGH (ref 0.40–4.50)

## 2021-02-16 LAB — LIPID PANEL
Cholesterol: 161 mg/dL (ref <200)
HDL: 61 mg/dL (ref >=50)
LDL Cholesterol (Calc): 76 mg/dL (calc)
Non-HDL Cholesterol (Calc): 100 mg/dL (calc) (ref <130)
Total CHOL/HDL Ratio: 2.6 (calc) (ref <5.0)
Triglycerides: 141 mg/dL (ref <150)

## 2021-02-16 LAB — CBC
HCT: 46.9 % — ABNORMAL HIGH (ref 35.0–45.0)
Hemoglobin: 16.3 g/dL — ABNORMAL HIGH (ref 11.7–15.5)
MCH: 33.1 pg — ABNORMAL HIGH (ref 27.0–33.0)
MCHC: 34.8 g/dL (ref 32.0–36.0)
MCV: 95.3 fL (ref 80.0–100.0)
MPV: 10 fL (ref 7.5–12.5)
Platelets: 178 10*3/uL (ref 140–400)
RBC: 4.92 10*6/uL (ref 3.80–5.10)
RDW: 13 % (ref 11.0–15.0)
WBC: 6.1 10*3/uL (ref 3.8–10.8)

## 2021-02-16 LAB — COMPREHENSIVE METABOLIC PANEL
AG Ratio: 1.9 (calc) (ref 1.0–2.5)
ALT: 31 U/L — ABNORMAL HIGH (ref 6–29)
AST: 28 U/L (ref 10–35)
Albumin: 4.7 g/dL (ref 3.6–5.1)
Alkaline phosphatase (APISO): 66 U/L (ref 37–153)
BUN/Creatinine Ratio: 10 (calc) (ref 6–22)
BUN: 13 mg/dL (ref 7–25)
CO2: 28 mmol/L (ref 20–32)
Calcium: 9.7 mg/dL (ref 8.6–10.4)
Chloride: 103 mmol/L (ref 98–110)
Creat: 1.3 mg/dL — ABNORMAL HIGH (ref 0.50–1.05)
Globulin: 2.5 g/dL (calc) (ref 1.9–3.7)
Glucose, Bld: 106 mg/dL — ABNORMAL HIGH (ref 65–99)
Potassium: 4.9 mmol/L (ref 3.5–5.3)
Sodium: 142 mmol/L (ref 135–146)
Total Bilirubin: 0.8 mg/dL (ref 0.2–1.2)
Total Protein: 7.2 g/dL (ref 6.1–8.1)

## 2021-02-16 LAB — HEMOGLOBIN A1C
Hgb A1c MFr Bld: 6.1 % of total Hgb — ABNORMAL HIGH (ref <5.7)
Mean Plasma Glucose: 128 mg/dL
eAG (mmol/L): 7.1 mmol/L

## 2021-02-16 LAB — VITAMIN B12: Vitamin B-12: 509 pg/mL (ref 200–1100)

## 2021-02-16 MED ORDER — ONDANSETRON 8 MG PO TBDP: 8.0000 mg | ORAL_TABLET | Freq: Three times a day (TID) | ORAL | 3 refills | Status: DC | PRN

## 2021-02-16 MED ORDER — LEVOTHYROXINE SODIUM 150 MCG PO TABS: 150.0000 ug | ORAL_TABLET | Freq: Every day | ORAL | 0 refills | Status: DC

## 2021-02-16 MED ORDER — LEVOTHYROXINE SODIUM 150 MCG PO TABS: 150.0000 ug | ORAL_TABLET | Freq: Every day | ORAL | 3 refills | Status: DC

## 2021-02-16 NOTE — Progress Notes (Signed)
Hypothyroidism Was noncompliant with levothyroxine dosing prior to her blood draw explaining a TSH over 50. She will increase her compliance with levothyroxine then we can recheck in about a month.

## 2021-02-16 NOTE — Assessment & Plan Note (Signed)
Was noncompliant with levothyroxine dosing prior to her blood draw explaining a TSH over 50. She will increase her compliance with levothyroxine then we can recheck in about a month.

## 2021-02-16 NOTE — Telephone Encounter (Signed)
Sent in another message

## 2021-03-09 ENCOUNTER — Other Ambulatory Visit (HOSPITAL_COMMUNITY): Payer: Self-pay | Admitting: Psychiatry

## 2021-03-09 DIAGNOSIS — F332 Major depressive disorder, recurrent severe without psychotic features: Secondary | ICD-10-CM

## 2021-04-05 ENCOUNTER — Other Ambulatory Visit: Payer: Self-pay | Admitting: Sports Medicine

## 2021-04-05 DIAGNOSIS — E785 Hyperlipidemia, unspecified: Secondary | ICD-10-CM

## 2021-04-07 DIAGNOSIS — M797 Fibromyalgia: Secondary | ICD-10-CM | POA: Diagnosis not present

## 2021-04-07 DIAGNOSIS — M5030 Other cervical disc degeneration, unspecified cervical region: Secondary | ICD-10-CM | POA: Diagnosis not present

## 2021-04-07 DIAGNOSIS — G894 Chronic pain syndrome: Secondary | ICD-10-CM | POA: Diagnosis not present

## 2021-04-07 DIAGNOSIS — M47816 Spondylosis without myelopathy or radiculopathy, lumbar region: Secondary | ICD-10-CM | POA: Diagnosis not present

## 2021-04-14 ENCOUNTER — Telehealth (HOSPITAL_COMMUNITY): Payer: Self-pay | Admitting: Psychiatry

## 2021-04-14 NOTE — Telephone Encounter (Signed)
Dr. Gilmore Laroche out of office on 04/23/21 ?Called pt - left vm 9:09am ?

## 2021-04-23 ENCOUNTER — Telehealth (HOSPITAL_COMMUNITY): Payer: Self-pay | Admitting: Psychiatry

## 2021-05-20 ENCOUNTER — Telehealth: Payer: Self-pay

## 2021-05-20 NOTE — Telephone Encounter (Signed)
Pt called stating that she has been having intermittent chest pains, that she describes as sharp and dull, for the last 2 weeks.  She states that she sometimes feels her heart "flutter".  Initial episode wakened the pt from sleep.  She denies arm pain, neck pain, N/V, diaphoresis, but states that she has COPD and she has had a few episodes of increased SOB from baseline.  Advised pt to proceed to ED, but pt refused, stating that she just wants to come in and see Dr. Benjamin Stain.  Advised pt that we have no available appts today, but pt stated that she was okay with waiting until tomorrow because she does not feel like this is an emergency.  Advised pt that if she has worsening of symptoms, she is to proceed directly to ED via EMS.  Pt was given appt for 05/21/2021.  Pt expressed understanding and is agreeable.  Tiajuana Amass, CMA

## 2021-05-21 ENCOUNTER — Encounter: Payer: Self-pay | Admitting: Sports Medicine

## 2021-05-21 ENCOUNTER — Ambulatory Visit (INDEPENDENT_AMBULATORY_CARE_PROVIDER_SITE_OTHER): Payer: BC Managed Care – PPO

## 2021-05-21 ENCOUNTER — Ambulatory Visit (INDEPENDENT_AMBULATORY_CARE_PROVIDER_SITE_OTHER): Payer: BC Managed Care – PPO | Admitting: Sports Medicine

## 2021-05-21 DIAGNOSIS — Z8709 Personal history of other diseases of the respiratory system: Secondary | ICD-10-CM | POA: Diagnosis not present

## 2021-05-21 DIAGNOSIS — R072 Precordial pain: Secondary | ICD-10-CM | POA: Diagnosis not present

## 2021-05-21 DIAGNOSIS — R079 Chest pain, unspecified: Secondary | ICD-10-CM

## 2021-05-21 DIAGNOSIS — E039 Hypothyroidism, unspecified: Secondary | ICD-10-CM | POA: Diagnosis not present

## 2021-05-21 DIAGNOSIS — J411 Mucopurulent chronic bronchitis: Secondary | ICD-10-CM | POA: Diagnosis not present

## 2021-05-21 NOTE — Assessment & Plan Note (Signed)
Historically not consistent with levothyroxine, rechecking today.

## 2021-05-21 NOTE — Progress Notes (Addendum)
    Procedures performed today:    None.  Independent interpretation of notes and tests performed by another provider:   None.  Brief History, Exam, Impression, and Recommendations:    Chest pain Jennifer [name] is a pleasant 61 year old female, for the past few weeks she has had intermittent episodes of stabbing chest pain, center of the chest, they have exclusively occurred at night when laying flat in bed. She does endorse eating within 1 hour prior to bedtime typically. No sour brash, no nausea, diaphoresis, no chest pain with exertion. He does have a history of chronic lung disease. We will get some labs, chest x-ray, and I do think we need to go ahead and do a stress test to fully restratify her, it will need to be a nuclear stress test as she is unable to walk on a treadmill. If the cardiac work-up is unrevealing we can add a PPI. She will also work on the lifestyle modifications in the meantime.  Update: Jennifer [name] declined the stress test.  Bronchitis, mucopurulent recurrent (HCC) Recurrent episodes of bronchitis, she has had normal spirometry several times, persistent cough, improved with Trelegy. She does need occasional courses of prednisone. Pulse ox remains in the lower 90s, we will do a walking test at the follow-up. Historically imaging has been unrevealing. We may consider a chest CT at the follow-up visit as well. I do suspect the majority is related to body habitus.  Hypothyroidism Historically not consistent with levothyroxine, rechecking today.    ___________________________________________ Ihor Austin. Benjamin Stain, M.D., ABFM., CAQSM. Primary Care and Sports Medicine [facility]  Adjunct Instructor of Family Medicine  [employer]

## 2021-05-21 NOTE — Assessment & Plan Note (Addendum)
Jennifer [name] is a pleasant 61 year old female, for the past few weeks she has had intermittent episodes of stabbing chest pain, center of the chest, they have exclusively occurred at night when laying flat in bed. She does endorse eating within 1 hour prior to bedtime typically. No sour brash, no nausea, diaphoresis, no chest pain with exertion. He does have a history of chronic lung disease. We will get some labs, chest x-ray, and I do think we need to go ahead and do a stress test to fully restratify her, it will need to be a nuclear stress test as she is unable to walk on a treadmill. If the cardiac work-up is unrevealing we can add a PPI. She will also work on the lifestyle modifications in the meantime.  Update: Jennifer [name] declined the stress test.

## 2021-05-21 NOTE — Assessment & Plan Note (Signed)
Recurrent episodes of bronchitis, she has had normal spirometry several times, persistent cough, improved with Trelegy. She does need occasional courses of prednisone. Pulse ox remains in the lower 90s, we will do a walking test at the follow-up. Historically imaging has been unrevealing. We may consider a chest CT at the follow-up visit as well. I do suspect the majority is related to body habitus.

## 2021-05-22 LAB — LIPID PANEL
Cholesterol: 150 mg/dL (ref <200)
HDL: 59 mg/dL (ref >=50)
LDL Cholesterol (Calc): 71 mg/dL (calc)
Non-HDL Cholesterol (Calc): 91 mg/dL (calc) (ref <130)
Total CHOL/HDL Ratio: 2.5 (calc) (ref <5.0)
Triglycerides: 113 mg/dL (ref <150)

## 2021-05-22 LAB — CBC
HCT: 47.1 % — ABNORMAL HIGH (ref 35.0–45.0)
Hemoglobin: 16 g/dL — ABNORMAL HIGH (ref 11.7–15.5)
MCH: 32.2 pg (ref 27.0–33.0)
MCHC: 34 g/dL (ref 32.0–36.0)
MCV: 94.8 fL (ref 80.0–100.0)
MPV: 10.4 fL (ref 7.5–12.5)
Platelets: 148 10*3/uL (ref 140–400)
RBC: 4.97 10*6/uL (ref 3.80–5.10)
RDW: 13.2 % (ref 11.0–15.0)
WBC: 5.8 10*3/uL (ref 3.8–10.8)

## 2021-05-22 LAB — BRAIN NATRIURETIC PEPTIDE: Brain Natriuretic Peptide: 37 pg/mL (ref <100)

## 2021-05-22 LAB — COMPLETE METABOLIC PANEL WITH GFR
AG Ratio: 1.9 (calc) (ref 1.0–2.5)
ALT: 42 U/L — ABNORMAL HIGH (ref 6–29)
AST: 34 U/L (ref 10–35)
Albumin: 4.4 g/dL (ref 3.6–5.1)
Alkaline phosphatase (APISO): 64 U/L (ref 37–153)
BUN/Creatinine Ratio: 11 (calc) (ref 6–22)
BUN: 12 mg/dL (ref 7–25)
CO2: 25 mmol/L (ref 20–32)
Calcium: 9.7 mg/dL (ref 8.6–10.4)
Chloride: 103 mmol/L (ref 98–110)
Creat: 1.11 mg/dL — ABNORMAL HIGH (ref 0.50–1.05)
Globulin: 2.3 g/dL (calc) (ref 1.9–3.7)
Glucose, Bld: 117 mg/dL — ABNORMAL HIGH (ref 65–99)
Potassium: 4.6 mmol/L (ref 3.5–5.3)
Sodium: 141 mmol/L (ref 135–146)
Total Bilirubin: 0.7 mg/dL (ref 0.2–1.2)
Total Protein: 6.7 g/dL (ref 6.1–8.1)
eGFR: 56 mL/min/{1.73_m2} — ABNORMAL LOW (ref >=60)

## 2021-05-22 LAB — TSH: TSH: 0.65 mIU/L (ref 0.40–4.50)

## 2021-05-24 ENCOUNTER — Other Ambulatory Visit: Payer: Self-pay

## 2021-05-24 DIAGNOSIS — E039 Hypothyroidism, unspecified: Secondary | ICD-10-CM

## 2021-05-24 MED ORDER — LEVOTHYROXINE SODIUM 150 MCG PO TABS: 150.0000 ug | ORAL_TABLET | Freq: Every day | ORAL | 3 refills | Status: DC

## 2021-05-24 NOTE — Telephone Encounter (Signed)
Called pt to discuss test results.  She requested refill of levothyroxine.  Refill sent to pharmacy.  Tiajuana Amass, CMA

## 2021-06-04 ENCOUNTER — Encounter (HOSPITAL_COMMUNITY): Payer: Self-pay | Admitting: Sports Medicine

## 2021-06-09 ENCOUNTER — Telehealth (HOSPITAL_COMMUNITY): Payer: Self-pay | Admitting: Sports Medicine

## 2021-06-09 NOTE — Telephone Encounter (Signed)
I called patient to schedule Myoview per your order ad patient is not wanting to schedule at this time and will call back at a later date if she decides to. Order will be removed from the [facility]. Thank you

## 2021-06-09 NOTE — Telephone Encounter (Signed)
Noted- thanks for the heads up.

## 2021-06-29 ENCOUNTER — Other Ambulatory Visit (HOSPITAL_COMMUNITY): Payer: Self-pay | Admitting: Psychiatry

## 2021-06-29 DIAGNOSIS — F332 Major depressive disorder, recurrent severe without psychotic features: Secondary | ICD-10-CM

## 2021-08-01 ENCOUNTER — Other Ambulatory Visit: Payer: Self-pay | Admitting: Sports Medicine

## 2021-08-01 DIAGNOSIS — E785 Hyperlipidemia, unspecified: Secondary | ICD-10-CM

## 2021-08-23 ENCOUNTER — Other Ambulatory Visit (HOSPITAL_COMMUNITY): Payer: Self-pay

## 2021-08-23 MED ORDER — LAMOTRIGINE 200 MG PO TABS: 200.0000 mg | ORAL_TABLET | Freq: Every day | ORAL | 0 refills | Status: DC

## 2021-08-25 ENCOUNTER — Telehealth (INDEPENDENT_AMBULATORY_CARE_PROVIDER_SITE_OTHER): Payer: Self-pay | Admitting: Psychiatry

## 2021-08-25 ENCOUNTER — Encounter (HOSPITAL_COMMUNITY): Payer: Self-pay | Admitting: Psychiatry

## 2021-08-25 DIAGNOSIS — F332 Major depressive disorder, recurrent severe without psychotic features: Secondary | ICD-10-CM

## 2021-08-25 DIAGNOSIS — F5102 Adjustment insomnia: Secondary | ICD-10-CM

## 2021-08-25 DIAGNOSIS — F411 Generalized anxiety disorder: Secondary | ICD-10-CM

## 2021-08-25 DIAGNOSIS — M797 Fibromyalgia: Secondary | ICD-10-CM

## 2021-08-25 MED ORDER — PAROXETINE HCL 20 MG PO TABS: ORAL_TABLET | ORAL | 0 refills | Status: DC

## 2021-08-25 NOTE — Progress Notes (Signed)
Patient ID: Jennifer [name], female   DOB: [birth year], 61 y.o.   MRN: [id number]   [facility] Follow-up Outpatient Visit Telepsych visit Jennifer [name] 1960-03-26  Date:08/25/2021    Virtual Visit via Video Note  I connected with Jennifer [name] on 08/25/21 at  2:30 PM EDT by a video enabled telemedicine application and verified that I am speaking with the correct person using two identifiers.  Location: Patient: home Provider: home office   I discussed the limitations of evaluation and management by telemedicine and the availability of in person appointments. The patient expressed understanding and agreed to proceed.        I discussed the assessment and treatment plan with the patient. The patient was provided an opportunity to ask questions and all were answered. The patient agreed with the plan and demonstrated an understanding of the instructions.   The patient was advised to call back or seek an in-person evaluation if the symptoms worsen or if the condition fails to improve as anticipated.  I provided 15 - 20  minutes of non-face-to-face time during this encounter.    Chief complaint : depression follow up     HPI:    Jennifer [name] is a 61  year old female with a diagnosis of major Depressive Disorder. 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   Doing fair mood wise, but fibro and tiredness can effect sleep cycle and mood Takes gaba and is on pain medications  Supportive husband Got a puppy that has energized her to some extent    Depression: baseline  Modifying factor:husband Severity fair      Review of Systems  Cardiovascular:  Negative for chest pain.  Musculoskeletal:  Positive for myalgias.  Psychiatric/Behavioral:  Negative for substance abuse and suicidal ideas.      Physical Exam  Vitals reviewed.  Constitutional: She appears well-developed and well-nourished. No distress.  Skin: She is not diaphoretic.     Past  Medical History: Reviewed.  Past Medical History   Diagnosis  Date     Discoid lupus      Thyroid disease      Perimenopausal      Fibromyalgia      CFS (chronic fatigue syndrome)      Sleep apnea      Allergies: Reviewed.  Allergies   Allergen  Reactions     Acetaminophen      Pt states she is unable to take this bc of Hashimoto Thyroid     Hydrocodone-Acetaminophen      REACTION: agitation     Oxycodone      Hallucinating, sweating, itching    Current Medications: Reviewed.  Current Outpatient Medications on File Prior to Visit  Medication Sig Dispense Refill   albuterol (PROVENTIL HFA) 108 (90 Base) MCG/ACT inhaler Inhale 1-2 puffs into the lungs every 4 (four) hours as needed for wheezing or shortness of breath. 1 Inhaler 3   albuterol (PROVENTIL) (2.5 MG/3ML) 0.083% nebulizer solution Take 3 mLs (2.5 mg total) by nebulization every 6 (six) hours as needed for wheezing or shortness of breath. 75 mL 6   atorvastatin (LIPITOR) 40 MG tablet Take 1 tablet (40 mg total) by mouth daily. Need appt in September. 90 tablet 0   fluticasone (FLONASE) 50 MCG/ACT nasal spray One spray in each nostril twice a day, use left hand for right nostril, and right hand for left nostril. 48 g 3   gabapentin (NEURONTIN) 800 MG tablet TAKE 1 TABLET BY  MOUTH 2 TIMES DAILY. 180 tablet 3   HYDROcodone-acetaminophen (NORCO) 10-325 MG tablet Take 1 tablet by mouth 4 (four) times daily as needed.     ketotifen (ZADITOR) 0.025 % ophthalmic solution Place 1 drop into both eyes 2 (two) times daily. 5 mL 0   lamoTRIgine (LAMICTAL) 200 MG tablet Take 1 tablet (200 mg total) by mouth daily. 90 tablet 0   levothyroxine (SYNTHROID) 150 MCG tablet Take 1 tablet (150 mcg total) by mouth daily before breakfast. Needs labs. 90 tablet 3   nystatin (MYCOSTATIN) 100000 UNIT/ML suspension Swish and spit 5 mL 4 times daily if thrush develops 60 mL 11   ondansetron (ZOFRAN-ODT) 8 MG disintegrating tablet Take 1 tablet (8 mg  total) by mouth every 8 (eight) hours as needed for nausea. 20 tablet 3   Phenazopyridine HCl (AZO TABS PO) Take 1 tablet by mouth daily.     traZODone (DESYREL) 100 MG tablet TAKE 2 TO 3 TABS AT NIGHT FOR SLEEP 270 tablet 0   TRELEGY ELLIPTA 100-62.5-25 MCG/ACT AEPB TAKE 1 PUFF BY MOUTH EVERY DAY 60 each 11   No current facility-administered medications on file prior to visit.       Family History: Reviewed.  Family History   Problem  Relation  Age of Onset     Hypertension  Father      Heart disease  Sister  56      heart attack      Depression  Brother      Cancer  Mother  56      colon    Psychiatric Specialty Exam: Appearance : casual  Eye contact fair  Speech:  Clear and Coherent and Normal Rate  Volume:  Normal  Mood: fair  Affect:   Thought Process:  clear  Orientation:  Full (Time, Place, and Person)  Thought Content:  WDL  Suicidal Thoughts:  No  Homicidal Thoughts:  No  Memory: somewhat subdued  Judgement:  Good  Insight:  Fair  Psychomotor Activity:  decreased  Concentration:  Fair  Recall:  Poor  Akathisia:  No  Handed:  Right  Fund of knowledge-average to above average  Language-Intact  AIMS (if indicated):     Assets:  Communication Skills Desire for Improvement Financial Resources/Insurance Housing Intimacy Leisure Time Physical Health Resilience Social Support Talents/Skills Transportation Vocational/Educational  Language intact   Fund of knowledge is good     Prior documentation and copy reviewed Assessment:  Major Depression, Recurrent severe  Alcohol Dependence in full sustained remission-stable AXIS I  Major Depression, Recurrent severe, Alcohol Dependence in full sustained remission . Fibromyalgia.     Treatment Plan/Recommendations:    Prior documentation reviewed   Major depression; gets subded but overall feels med keep balance in mood unless her fibromyalgia limits her movements  Continue paxil and lamcital  No  rash   Discussed to look for positive things during the day to keep engaged Anxiety disorder:flares up at times, continue paxil and distraction from negative thougghts   fibromyalgia;:flares up at times, paxil increase has helped. Also takes gabapentin   Insomnia; sleep irregular at times, continue work on sleep hygiene and trazadone  Fu 3 -46m.  Thresa Ross, MD

## 2021-08-31 DIAGNOSIS — M47816 Spondylosis without myelopathy or radiculopathy, lumbar region: Secondary | ICD-10-CM | POA: Diagnosis not present

## 2021-08-31 DIAGNOSIS — G894 Chronic pain syndrome: Secondary | ICD-10-CM | POA: Diagnosis not present

## 2021-08-31 DIAGNOSIS — M797 Fibromyalgia: Secondary | ICD-10-CM | POA: Diagnosis not present

## 2021-08-31 DIAGNOSIS — M5030 Other cervical disc degeneration, unspecified cervical region: Secondary | ICD-10-CM | POA: Diagnosis not present

## 2021-09-25 ENCOUNTER — Other Ambulatory Visit (HOSPITAL_COMMUNITY): Payer: Self-pay | Admitting: Psychiatry

## 2021-09-25 DIAGNOSIS — F332 Major depressive disorder, recurrent severe without psychotic features: Secondary | ICD-10-CM

## 2021-10-13 ENCOUNTER — Ambulatory Visit: Payer: BC Managed Care – PPO | Admitting: Sports Medicine

## 2021-10-19 ENCOUNTER — Ambulatory Visit (INDEPENDENT_AMBULATORY_CARE_PROVIDER_SITE_OTHER): Payer: BC Managed Care – PPO

## 2021-10-19 ENCOUNTER — Ambulatory Visit (INDEPENDENT_AMBULATORY_CARE_PROVIDER_SITE_OTHER): Payer: BC Managed Care – PPO | Admitting: Sports Medicine

## 2021-10-19 DIAGNOSIS — M5136 Other intervertebral disc degeneration, lumbar region: Secondary | ICD-10-CM

## 2021-10-19 DIAGNOSIS — M51369 Other intervertebral disc degeneration, lumbar region without mention of lumbar back pain or lower extremity pain: Secondary | ICD-10-CM

## 2021-10-19 MED ORDER — PREDNISONE 50 MG PO TABS: ORAL_TABLET | ORAL | 0 refills | Status: DC

## 2021-10-19 NOTE — Assessment & Plan Note (Signed)
Jennifer [name] is a pleasant 61 year old female, she does have a long history of low back problems, she had an L4-L5 laminectomy and microdiscectomy a few years back. Did well for a while but unfortunately is having increasing pain in the left lower leg, lateral ankle, as well as plantar arch. No overt numbness and tingling. Exam of the ankle is normal with the exception of weakness to dorsiflexion and eversion. For this reason we are going to proceed with prednisone, x-rays, formal physical therapy and considering the progressive weakness we do need an MRI.

## 2021-10-19 NOTE — Progress Notes (Signed)
    Procedures performed today:    None.  Independent interpretation of notes and tests performed by another provider:   None.  Brief History, Exam, Impression, and Recommendations:    Lumbar degenerative disc disease Tracia is a pleasant 61 year old female, she does have a long history of low back problems, she had an L4-L5 laminectomy and microdiscectomy a few years back. Did well for a while but unfortunately is having increasing pain in the left lower leg, lateral ankle, as well as plantar arch. No overt numbness and tingling. Exam of the ankle is normal with the exception of weakness to dorsiflexion and eversion. For this reason we are going to proceed with prednisone, x-rays, formal physical therapy and considering the progressive weakness we do need an MRI.  Chronic process with exacerbation and pharmacologic prevention  ____________________________________________ Gwen Her. Dianah Field, M.D., ABFM., CAQSM., AME. Primary Care and Sports Medicine [facility]  Adjunct Professor of [place] [employer] [facility] [employer]  Risk manager

## 2021-10-31 ENCOUNTER — Other Ambulatory Visit: Payer: BC Managed Care – PPO

## 2021-11-02 ENCOUNTER — Ambulatory Visit: Payer: BC Managed Care – PPO | Admitting: Physical Therapy

## 2021-11-02 NOTE — Therapy (Deleted)
OUTPATIENT PHYSICAL THERAPY THORACOLUMBAR EVALUATION   Patient Name: Jennifer [name] MRN: [id number] DOB:Jun 12, 1960, 61 y.o., female Today's Date: 11/02/2021    Past Medical History:  Diagnosis Date   Alcohol abuse    Asthmatic bronchitis 2013   CFS (chronic fatigue syndrome)    Discoid lupus    Fibromyalgia    GERD (gastroesophageal reflux disease) 2013   Hyperlipidemia    Postartificial menopausal syndrome    Sleep apnea    Thyroid disease    Past Surgical History:  Procedure Laterality Date   ABDOMINAL HYSTERECTOMY  09/04/2007   TAH with bladder sling   LUMBAR LAMINECTOMY  09/28/2017   L4-L5   NASAL SEPTUM SURGERY     orto surgery plate in leg     TUBAL LIGATION     bilateral   uvuloplasty     Patient Active Problem List   Diagnosis Date Noted   Chest pain 05/21/2021   Overactive bladder 03/08/2018   Perennial allergic rhinitis 02/05/2016   Restless leg syndrome 03/16/2015   Benign paroxysmal positional vertigo 03/16/2015   Irritable bowel syndrome with constipation 01/16/2014   DDD (degenerative disc disease), cervical 09/11/2012   Menopausal syndrome 06/12/2012   Hyperlipidemia with target low density lipoprotein (LDL) cholesterol less than 100 mg/dL 83/41/9622   Annual physical exam 02/06/2012   Lumbar degenerative disc disease 01/25/2012   Bronchitis, mucopurulent recurrent (HCC) 03/17/2011   Discoid lupus    Hypothyroidism    Sleep apnea    Major depressive disorder, recurrent episode, severe (HCC) 03/10/2011   Female bladder prolapse 04/17/2006   Fibromyalgia with chronic fatigue 04/06/2006    PCP: ***  REFERRING PROVIDER: Monica Becton, MD   REFERRING DIAG: M51.36 (ICD-10-CM) - Lumbar degenerative disc disease   Rationale for Evaluation and Treatment: Rehabilitation  THERAPY DIAG:  No diagnosis found.  ONSET DATE: ***  SUBJECTIVE:                                                                                                                                                                                            SUBJECTIVE STATEMENT: ***  PERTINENT HISTORY:  L4-L5 laminectomy and microdiscectomy   PAIN:  Are you having pain? {OPRCPAIN:27236}  PRECAUTIONS: {Therapy precautions:24002}  WEIGHT BEARING RESTRICTIONS: {Yes ***/No:24003}  FALLS:  Has patient fallen in last 6 months? {fallsyesno:27318}  LIVING ENVIRONMENT: Lives with: {OPRC lives with:25569::"lives with their family"} Lives in: {Lives in:25570} Stairs: {opstairs:27293} Has following equipment at home: {Assistive devices:23999}  OCCUPATION: ***  PLOF: {PLOF:24004}  PATIENT GOALS: ***   OBJECTIVE:   DIAGNOSTIC FINDINGS:  X-ray 10/17: 1. Mild disc space narrowing at L3-L4 and  L4-L5. 2. Mild dextroscoliotic curvature. 3. Mild L4-L5 and L5-S1 facet hypertrophy  PATIENT SURVEYS:  {rehab surveys:24030}  SCREENING FOR RED FLAGS: Bowel or bladder incontinence: {Yes/No:304960894} Spinal tumors: {Yes/No:304960894} Cauda equina syndrome: {Yes/No:304960894} Compression fracture: {Yes/No:304960894} Abdominal aneurysm: {Yes/No:304960894}  COGNITION: Overall cognitive status: {cognition:24006}     SENSATION: {sensation:27233}  MUSCLE LENGTH: Hamstrings: Right *** deg; Left *** deg Thomas test: Right *** deg; Left *** deg  POSTURE: {posture:25561}  PALPATION: ***  LUMBAR ROM:   AROM eval  Flexion   Extension   Right lateral flexion   Left lateral flexion   Right rotation   Left rotation    (Blank rows = not tested)  LOWER EXTREMITY ROM:     {AROM/PROM:27142}  Right eval Left eval  Hip flexion    Hip extension    Hip abduction    Hip adduction    Hip internal rotation    Hip external rotation    Knee flexion    Knee extension    Ankle dorsiflexion    Ankle plantarflexion    Ankle inversion    Ankle eversion     (Blank rows = not tested)  LOWER EXTREMITY MMT:    MMT Right eval Left eval  Hip flexion     Hip extension    Hip abduction    Hip adduction    Hip internal rotation    Hip external rotation    Knee flexion    Knee extension    Ankle dorsiflexion    Ankle plantarflexion    Ankle inversion    Ankle eversion     (Blank rows = not tested)  LUMBAR SPECIAL TESTS:  {lumbar special test:25242}  FUNCTIONAL TESTS:  {Functional tests:24029}  GAIT: Distance walked: *** Assistive device utilized: {Assistive devices:23999} Level of assistance: {Levels of assistance:24026} Comments: ***  TODAY'S TREATMENT:                                                                                                                              DATE: ***    PATIENT EDUCATION:  Education details: Exam findings, POC, initial HEP Person educated: Patient Education method: Consulting civil engineer, Demonstration, and Handouts Education comprehension: verbalized understanding, returned demonstration, and needs further education  HOME EXERCISE PROGRAM: ***  ASSESSMENT:  CLINICAL IMPRESSION: Patient is a 61 y.o. F who was seen today for physical therapy evaluation and treatment for back pain. ***  OBJECTIVE IMPAIRMENTS: {opptimpairments:25111}.   ACTIVITY LIMITATIONS: {activitylimitations:27494}  PARTICIPATION LIMITATIONS: {participationrestrictions:25113}  PERSONAL FACTORS: {Personal factors:25162} are also affecting patient's functional outcome.   REHAB POTENTIAL: {rehabpotential:25112}  CLINICAL DECISION MAKING: {clinical decision making:25114}  EVALUATION COMPLEXITY: {Evaluation complexity:25115}   GOALS: Goals reviewed with patient? {yes/no:20286}  SHORT TERM GOALS: Target date: {follow up:25551}  *** Baseline: Goal status: {GOALSTATUS:25110}  2.  *** Baseline:  Goal status: {GOALSTATUS:25110}  3.  *** Baseline:  Goal status: {GOALSTATUS:25110}  4.  *** Baseline:  Goal status: {GOALSTATUS:25110}  5.  *** Baseline:  Goal status: {GOALSTATUS:25110}  6.  *** Baseline:   Goal status: {GOALSTATUS:25110}  LONG TERM GOALS: Target date: {follow up:25551}  *** Baseline:  Goal status: {GOALSTATUS:25110}  2.  *** Baseline:  Goal status: {GOALSTATUS:25110}  3.  *** Baseline:  Goal status: {GOALSTATUS:25110}  4.  *** Baseline:  Goal status: {GOALSTATUS:25110}  5.  *** Baseline:  Goal status: {GOALSTATUS:25110}  6.  *** Baseline:  Goal status: {GOALSTATUS:25110}  PLAN:  PT FREQUENCY: {rehab frequency:25116}  PT DURATION: {rehab duration:25117}  PLANNED INTERVENTIONS: {rehab planned interventions:25118::"Therapeutic exercises","Therapeutic activity","Neuromuscular re-education","Balance training","Gait training","Patient/Family education","Self Care","Joint mobilization"}.  PLAN FOR NEXT SESSION: ***   Kaelynn Igo April Dell Ponto, PT, DPT 11/02/2021, 1:06 PM

## 2021-11-06 ENCOUNTER — Ambulatory Visit (INDEPENDENT_AMBULATORY_CARE_PROVIDER_SITE_OTHER): Payer: BC Managed Care – PPO

## 2021-11-06 DIAGNOSIS — M48061 Spinal stenosis, lumbar region without neurogenic claudication: Secondary | ICD-10-CM | POA: Diagnosis not present

## 2021-11-06 DIAGNOSIS — M5116 Intervertebral disc disorders with radiculopathy, lumbar region: Secondary | ICD-10-CM

## 2021-11-06 DIAGNOSIS — M51369 Other intervertebral disc degeneration, lumbar region without mention of lumbar back pain or lower extremity pain: Secondary | ICD-10-CM

## 2021-11-06 DIAGNOSIS — M5136 Other intervertebral disc degeneration, lumbar region: Secondary | ICD-10-CM

## 2021-11-09 ENCOUNTER — Encounter: Payer: Self-pay | Admitting: Sports Medicine

## 2021-11-10 ENCOUNTER — Ambulatory Visit: Payer: BC Managed Care – PPO | Admitting: Physical Therapy

## 2021-11-15 NOTE — Therapy (Unsigned)
OUTPATIENT PHYSICAL THERAPY THORACOLUMBAR EVALUATION   Patient Name: Jennifer [name] MRN: [id number] DOB:1960/04/08, 61 y.o., female Today's Date: 11/15/2021    Past Medical History:  Diagnosis Date   Alcohol abuse    Asthmatic bronchitis 2013   CFS (chronic fatigue syndrome)    Discoid lupus    Fibromyalgia    GERD (gastroesophageal reflux disease) 2013   Hyperlipidemia    Postartificial menopausal syndrome    Sleep apnea    Thyroid disease    Past Surgical History:  Procedure Laterality Date   ABDOMINAL HYSTERECTOMY  09/04/2007   TAH with bladder sling   LUMBAR LAMINECTOMY  09/28/2017   L4-L5   NASAL SEPTUM SURGERY     orto surgery plate in leg     TUBAL LIGATION     bilateral   uvuloplasty     Patient Active Problem List   Diagnosis Date Noted   Chest pain 05/21/2021   Overactive bladder 03/08/2018   Perennial allergic rhinitis 02/05/2016   Restless leg syndrome 03/16/2015   Benign paroxysmal positional vertigo 03/16/2015   Irritable bowel syndrome with constipation 01/16/2014   DDD (degenerative disc disease), cervical 09/11/2012   Menopausal syndrome 06/12/2012   Hyperlipidemia with target low density lipoprotein (LDL) cholesterol less than 100 mg/dL 62/83/1517   Annual physical exam 02/06/2012   Lumbar degenerative disc disease 01/25/2012   Bronchitis, mucopurulent recurrent (HCC) 03/17/2011   Discoid lupus    Hypothyroidism    Sleep apnea    Major depressive disorder, recurrent episode, severe (HCC) 03/10/2011   Female bladder prolapse 04/17/2006   Fibromyalgia with chronic fatigue 04/06/2006    PCP: Dr Mardene Speak  REFERRING PROVIDER: Dr Benjamin Stain  REFERRING DIAG: Lumbar DDD   Rationale for Evaluation and Treatment: Rehabilitation  THERAPY DIAG:  No diagnosis found.  ONSET DATE: ***  SUBJECTIVE:                                                                                                                                                                                            SUBJECTIVE STATEMENT: ***  PERTINENT HISTORY:  History of chronic LBP; lumbar laminectomy and microdiscectomy  PAIN:  Are you having pain? Yes: NPRS scale: ***/10 Pain location: *** Pain description: *** Aggravating factors: *** Relieving factors: ***  PRECAUTIONS: None  WEIGHT BEARING RESTRICTIONS: No  FALLS:  Has patient fallen in last 6 months? No  LIVING ENVIRONMENT: Lives with: lives with their spouse Lives in: House/apartment Stairs: {opstairs:27293} Has following equipment at home: {Assistive devices:23999}  OCCUPATION: ***  PLOF: Independent  PATIENT GOALS: ***  NEXT MD VISIT:   OBJECTIVE:   DIAGNOSTIC FINDINGS:  MRI 11/06/21: IMPRESSION: 1. Progressive leftward disc protrusion at L2-3 with mild left subarticular and moderate left foraminal stenosis. 2. Mild right foraminal narrowing at L2-3 is stable. 3. Mild left subarticular narrowing at L3-4 is stable. 4. Moderate left and mild right foraminal stenosis at L3-4 is stable. 5. Right laminectomy with decompression of the posterior canal at L4-5. 6. Persistent moderate left subarticular and bilateral foraminal stenosis at L4-5, similar to the prior exam. 7. Mild disc bulging and facet hypertrophy at L5-S1 without significant stenosis.  PATIENT SURVEYS:  FOTO ***  SCREENING FOR RED FLAGS: Bowel or bladder incontinence: {Yes/No:304960894} Spinal tumors: {Yes/No:304960894} Cauda equina syndrome: {Yes/No:304960894} Compression fracture: {Yes/No:304960894} Abdominal aneurysm: {Yes/No:304960894}  COGNITION: Overall cognitive status: Within functional limits for tasks assessed     SENSATION: {sensation:27233}  MUSCLE LENGTH: Hamstrings: Right *** deg; Left *** deg Thomas test: Right *** deg; Left *** deg  POSTURE: {posture:25561}  PALPATION: ***  LUMBAR ROM:   AROM eval  Flexion   Extension   Right lateral flexion   Left lateral flexion    Right rotation   Left rotation    (Blank rows = not tested)  LOWER EXTREMITY ROM:     Active  Right eval Left eval  Hip flexion    Hip extension    Hip abduction    Hip adduction    Hip internal rotation    Hip external rotation    Knee flexion    Knee extension    Ankle dorsiflexion    Ankle plantarflexion    Ankle inversion    Ankle eversion     (Blank rows = not tested)  LOWER EXTREMITY MMT:    MMT Right eval Left eval  Hip flexion    Hip extension    Hip abduction    Hip adduction    Hip internal rotation    Hip external rotation    Knee flexion    Knee extension    Ankle dorsiflexion    Ankle plantarflexion    Ankle inversion    Ankle eversion     (Blank rows = not tested)  LUMBAR SPECIAL TESTS:  Straight leg raise test: {pos/neg:25243} and Slump test: {pos/neg:25243}  FUNCTIONAL TESTS:  {Functional tests:24029}  GAIT: Distance walked: *** Assistive device utilized: {Assistive devices:23999} Level of assistance: {Levels of assistance:24026} Comments: ***  TODAY'S TREATMENT:                                                                                                                              DATE: 11/16/21    PATIENT EDUCATION:  Education details: POC; HEP Person educated: Patient Education method: Programmer, multimedia, Facilities manager, Actor cues, Verbal cues, and Handouts Education comprehension: verbalized understanding, returned demonstration, verbal cues required, tactile cues required, and needs further education  HOME EXERCISE PROGRAM: ***  ASSESSMENT:  CLINICAL IMPRESSION: Patient is a 61 y.o. female who was seen today for physical therapy evaluation and treatment for lumbar DDD.   OBJECTIVE  IMPAIRMENTS: {opptimpairments:25111}.   ACTIVITY LIMITATIONS: {activitylimitations:27494}  PARTICIPATION LIMITATIONS: {participationrestrictions:25113}  PERSONAL FACTORS: {Personal factors:25162} are also affecting patient's functional  outcome.   REHAB POTENTIAL: Good  CLINICAL DECISION MAKING: Stable/uncomplicated  EVALUATION COMPLEXITY: Low   GOALS: Goals reviewed with patient? Yes  SHORT TERM GOALS: Target date: 12/14/2021  *** Baseline: Goal status: INITIAL  2.  *** Baseline:  Goal status: INITIAL    LONG TERM GOALS: Target date: 01/11/2022  *** Baseline:  Goal status: INITIAL  2.  *** Baseline:  Goal status: INITIAL  3.  *** Baseline:  Goal status: INITIAL  4.  *** Baseline:  Goal status: INITIAL  5.  *** Baseline:  Goal status: INITIAL  6.  *** Baseline:  Goal status: INITIAL  PLAN:  PT FREQUENCY: 2x/week  PT DURATION: 8 weeks  PLANNED INTERVENTIONS: Therapeutic exercises, Therapeutic activity, Neuromuscular re-education, Balance training, Gait training, Patient/Family education, Self Care, Joint mobilization, Aquatic Therapy, Dry Needling, Electrical stimulation, Cryotherapy, Moist heat, Taping, Ultrasound, Ionotophoresis 4mg /ml Dexamethasone, Manual therapy, and Re-evaluation.  PLAN FOR NEXT SESSION: review and progress exercise; manual work vs dry needling as indicated; modalities as indicated    Maddisen Vought , PT, MPH  11/15/2021, 5:20 PM

## 2021-11-16 ENCOUNTER — Other Ambulatory Visit: Payer: Self-pay

## 2021-11-16 ENCOUNTER — Ambulatory Visit: Payer: BC Managed Care – PPO | Attending: Sports Medicine | Admitting: Physical Therapy

## 2021-11-16 ENCOUNTER — Encounter: Payer: Self-pay | Admitting: Physical Therapy

## 2021-11-16 DIAGNOSIS — M5136 Other intervertebral disc degeneration, lumbar region: Secondary | ICD-10-CM | POA: Diagnosis not present

## 2021-11-16 DIAGNOSIS — M79605 Pain in left leg: Secondary | ICD-10-CM | POA: Insufficient documentation

## 2021-11-16 DIAGNOSIS — M6281 Muscle weakness (generalized): Secondary | ICD-10-CM | POA: Diagnosis not present

## 2021-11-16 NOTE — Therapy (Signed)
OUTPATIENT PHYSICAL THERAPY THORACOLUMBAR EVALUATION     Patient Name: Jennifer [name] MRN: [id number] DOB:[birth year], 61 y.o., female Today's Date: 11/15/2021           Past Medical History:  Diagnosis Date   Alcohol abuse     Asthmatic bronchitis 2013   CFS (chronic fatigue syndrome)     Discoid lupus     Fibromyalgia     GERD (gastroesophageal reflux disease) 2013   Hyperlipidemia     Postartificial menopausal syndrome     Sleep apnea     Thyroid disease           Past Surgical History:  Procedure Laterality Date   ABDOMINAL HYSTERECTOMY   09/04/2007    TAH with bladder sling   LUMBAR LAMINECTOMY   09/28/2017    L4-L5   NASAL SEPTUM SURGERY       orto surgery plate in leg       TUBAL LIGATION        bilateral   uvuloplasty            Patient Active Problem List    Diagnosis Date Noted   Chest pain 05/21/2021   Overactive bladder 03/08/2018   Perennial allergic rhinitis 02/05/2016   Restless leg syndrome 03/16/2015   Benign paroxysmal positional vertigo 03/16/2015   Irritable bowel syndrome with constipation 01/16/2014   DDD (degenerative disc disease), cervical 09/11/2012   Menopausal syndrome 06/12/2012   Hyperlipidemia with target low density lipoprotein (LDL) cholesterol less than 100 mg/dL 03/47/4259   Annual physical exam 02/06/2012   Lumbar degenerative disc disease 01/25/2012   Bronchitis, mucopurulent recurrent (HCC) 03/17/2011   Discoid lupus     Hypothyroidism     Sleep apnea     Major depressive disorder, recurrent episode, severe (HCC) 03/10/2011   Female bladder prolapse 04/17/2006   Fibromyalgia with chronic fatigue 04/06/2006      PCP: Dr Mardene Speak   REFERRING PROVIDER: Dr Benjamin Stain   REFERRING DIAG: Lumbar DDD    Rationale for Evaluation and Treatment: Rehabilitation   THERAPY DIAG:  No diagnosis found.   ONSET DATE: 09/2021   SUBJECTIVE:                                                                                                                                                                                             SUBJECTIVE STATEMENT: Pt reports she had back surgery 3-4 years ago. She recently had onset of weakness of Lt ankle and foot, numbness in bilat feet and some increased ankle pain. MD recommends PT to improve strength.   PERTINENT HISTORY:  History of chronic LBP;  lumbar laminectomy and microdiscectomy, fibromyalgia, chronic fatigue, Lt ankle fracture years ago, Rt ankle surgery years ago   PAIN:  Are you having pain? Yes: NPRS scale: 4/10 Pain location: Left foot and ankle Pain description: achey, occasional stabbing Aggravating factors: walking, evening and night Relieving factors: meds   PRECAUTIONS: None   WEIGHT BEARING RESTRICTIONS: No   FALLS:  Has patient fallen in last 6 months? No   LIVING ENVIRONMENT: Lives with: lives with their spouse Has following equipment at home: Single point cane and Wheelchair (manual)   OCCUPATION: not working due to fibromyalgia and chronic fatigue   PLOF: Independent   PATIENT GOALS: decrease pain     OBJECTIVE:    DIAGNOSTIC FINDINGS:  MRI 11/06/21: IMPRESSION: 1. Progressive leftward disc protrusion at L2-3 with mild left subarticular and moderate left foraminal stenosis. 2. Mild right foraminal narrowing at L2-3 is stable. 3. Mild left subarticular narrowing at L3-4 is stable. 4. Moderate left and mild right foraminal stenosis at L3-4 is stable. 5. Right laminectomy with decompression of the posterior canal at L4-5. 6. Persistent moderate left subarticular and bilateral foraminal stenosis at L4-5, similar to the prior exam. 7. Mild disc bulging and facet hypertrophy at L5-S1 without significant stenosis.     COGNITION: Overall cognitive status: Within functional limits for tasks assessed                          SENSATION: WFL   MUSCLE LENGTH: Hamstrings: WFL   POSTURE: posterior pelvic tilt   PALPATION: TTP L1-2  with CPAs, TTP bilat glutes and ITB   LUMBAR ROM:    AROM eval  Flexion  10% limited  Extension  25% limited  Right lateral flexion 25% limited   Left lateral flexion 25% limited   Right rotation WFL   Left rotation WFL    (Blank rows = not tested)   LOWER EXTREMITY ROM:      Active  Right eval Left eval  Hip flexion      Hip extension      Hip abduction      Hip adduction      Hip internal rotation [facility]  WFL   Hip external rotation [facility]   Knee flexion      Knee extension      Ankle dorsiflexion      Ankle plantarflexion      Ankle inversion      Ankle eversion       (Blank rows = not tested)   LOWER EXTREMITY MMT:     MMT Right eval Left eval  Hip flexion  4 4-   Hip extension 4-  4-   Hip abduction  4 4-   Hip adduction      Hip internal rotation      Hip external rotation      Knee flexion      Knee extension      Ankle dorsiflexion  4 4-  Ankle plantarflexion      Ankle inversion      Ankle eversion       (Blank rows = not tested)   LUMBAR SPECIAL TESTS:  Straight leg raise test: Negative   FUNCTIONAL TESTS:  5 times sit to stand: 17.40 sec   GAIT: Distance walked: 41' Assistive device utilized: Single point cane Level of assistance: Modified independence Comments: pt uses SPC inside the house, uses w/c when outside the house   TODAY'S TREATMENT:  DATE: 11/16/21      PATIENT EDUCATION:  Education details: POC; HEP Person educated: Patient Education method: Consulting civil engineer, Demonstration, Corporate treasurer cues, Verbal cues, and Handouts Education comprehension: verbalized understanding, returned demonstration, verbal cues required, tactile cues required, and needs further education   HOME EXERCISE PROGRAM: Access Code: [email or web address] URL: [email or web address] Date: 11/16/2021 Prepared by: Isabelle Course  Exercises - Sidelying Hip Abduction  - 1 x daily - 7 x weekly - 3 sets - 5 reps - Supine Bridge  - 1 x daily - 7 x weekly - 3 sets - 5 reps - Straight Leg Raise with Arm Support  - 1 x daily - 7 x weekly - 3 sets - 5 reps - Gastroc Stretch on Wall  - 1 x daily - 7 x weekly - 1 sets - 3 reps - 20-30 seconds hold - Long Sitting Calf Stretch with Strap  - 1 x daily - 7 x weekly - 1 sets - 3 reps   ASSESSMENT:   CLINICAL IMPRESSION: Patient is a 61 y.o. female who was seen today for physical therapy evaluation and treatment for lumbar DDD. Pt presents with decreased strength and activity tolerance, impaired gait, increased pain and will benefit from skilled PT to address deficits and improve functional mobility   OBJECTIVE IMPAIRMENTS: decreased activity tolerance, decreased mobility, difficulty walking, decreased strength, impaired flexibility, and pain.    ACTIVITY LIMITATIONS: standing and locomotion level   PARTICIPATION LIMITATIONS: meal prep, cleaning, driving, shopping, and community activity   PERSONAL FACTORS: 1-2 comorbidities: fibromyalgia and chronic fatigue are also affecting patient's functional outcome.    REHAB POTENTIAL: Good   CLINICAL DECISION MAKING: Stable/uncomplicated   EVALUATION COMPLEXITY: Low     GOALS: Goals reviewed with patient? Yes   SHORT TERM GOALS: Target date: 12/14/2021   Pt will be independent with initial HEP Baseline: Goal status: INITIAL      LONG TERM GOALS: Target date: 01/11/2022   Pt will be independent with advanced HEP Baseline:  Goal status: INITIAL   2.  Pt will improve LE strength to 4+/5 to improve walking and standing tolerance Baseline:  Goal status: INITIAL   3.  Pt will walk x 5 minutes with Lt LE pain <= 2/10 Baseline:  Goal status: INITIAL   4.  Pt will improve 5x STS to <=15 seconds to demo improved strength Baseline:  Goal status: INITIAL  PLAN:   PT FREQUENCY: 2x/week   PT DURATION: 8 weeks    PLANNED INTERVENTIONS: Therapeutic exercises, Therapeutic activity, Neuromuscular re-education, Balance training, Gait training, Patient/Family education, Self Care, Joint mobilization, Aquatic Therapy, Dry Needling, Electrical stimulation, Cryotherapy, Moist heat, Taping, Ultrasound, Ionotophoresis 4mg /ml Dexamethasone, Manual therapy, and Re-evaluation.   PLAN FOR NEXT SESSION: review and progress exercise, core and LE strength and mobility     Isabelle Course, PT,DPT11/14/235:03 PM

## 2021-11-16 NOTE — Progress Notes (Signed)
   Established Patient Office Visit  Subjective   Patient ID: PIETRINA JAGODZINSKI, female    DOB: [birth year]  Age: 61 y.o. MRN: [id number]  No chief complaint on file.   HPI    ROS    Objective:     There were no vitals taken for this visit.   Physical Exam   No results found for any visits on 11/16/21.    The 10-year ASCVD risk score (Arnett DK, et al., 2019) is: 4.8%    Assessment & Plan:   Problem List Items Addressed This Visit   None   No follow-ups on file.    Rumaysa Sabatino, PT

## 2021-11-18 ENCOUNTER — Encounter: Payer: BC Managed Care – PPO | Admitting: Physical Therapy

## 2021-11-19 ENCOUNTER — Other Ambulatory Visit (HOSPITAL_COMMUNITY): Payer: Self-pay | Admitting: Psychiatry

## 2021-11-19 DIAGNOSIS — F332 Major depressive disorder, recurrent severe without psychotic features: Secondary | ICD-10-CM

## 2021-11-28 ENCOUNTER — Other Ambulatory Visit (HOSPITAL_COMMUNITY): Payer: Self-pay | Admitting: Psychiatry

## 2021-12-07 ENCOUNTER — Ambulatory Visit: Payer: BC Managed Care – PPO | Admitting: Physical Therapy

## 2021-12-14 ENCOUNTER — Encounter: Payer: BC Managed Care – PPO | Admitting: Physical Therapy

## 2021-12-18 ENCOUNTER — Other Ambulatory Visit (HOSPITAL_COMMUNITY): Payer: Self-pay | Admitting: Psychiatry

## 2021-12-18 DIAGNOSIS — F332 Major depressive disorder, recurrent severe without psychotic features: Secondary | ICD-10-CM

## 2021-12-21 ENCOUNTER — Encounter: Payer: BC Managed Care – PPO | Admitting: Physical Therapy

## 2021-12-28 ENCOUNTER — Encounter: Payer: BC Managed Care – PPO | Admitting: Physical Therapy

## 2022-01-10 ENCOUNTER — Other Ambulatory Visit: Payer: Self-pay | Admitting: Sports Medicine

## 2022-01-10 DIAGNOSIS — E785 Hyperlipidemia, unspecified: Secondary | ICD-10-CM

## 2022-01-24 DIAGNOSIS — Z79899 Other long term (current) drug therapy: Secondary | ICD-10-CM | POA: Diagnosis not present

## 2022-01-24 DIAGNOSIS — Z5181 Encounter for therapeutic drug level monitoring: Secondary | ICD-10-CM | POA: Diagnosis not present

## 2022-01-24 DIAGNOSIS — M47816 Spondylosis without myelopathy or radiculopathy, lumbar region: Secondary | ICD-10-CM | POA: Diagnosis not present

## 2022-02-11 ENCOUNTER — Other Ambulatory Visit (HOSPITAL_COMMUNITY): Payer: Self-pay | Admitting: Psychiatry

## 2022-02-11 DIAGNOSIS — F332 Major depressive disorder, recurrent severe without psychotic features: Secondary | ICD-10-CM

## 2022-02-23 ENCOUNTER — Other Ambulatory Visit (HOSPITAL_COMMUNITY): Payer: Self-pay | Admitting: Psychiatry

## 2022-03-14 ENCOUNTER — Other Ambulatory Visit (HOSPITAL_COMMUNITY): Payer: Self-pay | Admitting: Psychiatry

## 2022-03-14 DIAGNOSIS — F332 Major depressive disorder, recurrent severe without psychotic features: Secondary | ICD-10-CM

## 2022-04-05 ENCOUNTER — Other Ambulatory Visit: Payer: Self-pay | Admitting: Sports Medicine

## 2022-04-05 DIAGNOSIS — E785 Hyperlipidemia, unspecified: Secondary | ICD-10-CM

## 2022-04-27 DIAGNOSIS — M797 Fibromyalgia: Secondary | ICD-10-CM | POA: Diagnosis not present

## 2022-04-27 DIAGNOSIS — Z9889 Other specified postprocedural states: Secondary | ICD-10-CM | POA: Diagnosis not present

## 2022-04-27 DIAGNOSIS — G894 Chronic pain syndrome: Secondary | ICD-10-CM | POA: Diagnosis not present

## 2022-04-27 DIAGNOSIS — M47816 Spondylosis without myelopathy or radiculopathy, lumbar region: Secondary | ICD-10-CM | POA: Diagnosis not present

## 2022-04-28 ENCOUNTER — Other Ambulatory Visit: Payer: Self-pay | Admitting: Sports Medicine

## 2022-04-28 DIAGNOSIS — R0602 Shortness of breath: Secondary | ICD-10-CM

## 2022-05-06 ENCOUNTER — Ambulatory Visit (INDEPENDENT_AMBULATORY_CARE_PROVIDER_SITE_OTHER): Payer: BC Managed Care – PPO | Admitting: Sports Medicine

## 2022-05-06 ENCOUNTER — Encounter: Payer: Self-pay | Admitting: Sports Medicine

## 2022-05-06 VITALS — BP 113/70 | HR 85

## 2022-05-06 DIAGNOSIS — E785 Hyperlipidemia, unspecified: Secondary | ICD-10-CM | POA: Diagnosis not present

## 2022-05-06 DIAGNOSIS — Z1211 Encounter for screening for malignant neoplasm of colon: Secondary | ICD-10-CM

## 2022-05-06 DIAGNOSIS — N951 Menopausal and female climacteric states: Secondary | ICD-10-CM

## 2022-05-06 DIAGNOSIS — R0602 Shortness of breath: Secondary | ICD-10-CM | POA: Diagnosis not present

## 2022-05-06 DIAGNOSIS — Z0000 Encounter for general adult medical examination without abnormal findings: Secondary | ICD-10-CM | POA: Diagnosis not present

## 2022-05-06 DIAGNOSIS — Z1231 Encounter for screening mammogram for malignant neoplasm of breast: Secondary | ICD-10-CM

## 2022-05-06 MED ORDER — TRELEGY ELLIPTA 100-62.5-25 MCG/ACT IN AEPB: INHALATION_SPRAY | RESPIRATORY_TRACT | 11 refills | Status: DC

## 2022-05-06 NOTE — Progress Notes (Signed)
    Procedures performed today:    None.  Independent interpretation of notes and tests performed by another provider:   None.  Brief History, Exam, Impression, and Recommendations:    Annual physical exam Due for mammogram and colon cancer screening. Jennifer [name] has become very sedentary, we need to get her moving, she will start with 5 minutes 2-4 times a week and build to 30 minutes 3-5 times a week, I would like her to bring back a diary in the next 2 to 3 months.  Menopausal syndrome Continues to have menopausal vasomotor instability. She is taking gabapentin,, minimally efficacious, I have suggested she look into Veoz[place] although she can live with the symptoms we can just watch it. She will let me know.    ____________________________________________ Ihor Austin. Benjamin Stain, M.D., ABFM., CAQSM., AME. Primary Care and Sports Medicine [facility]  Adjunct Professor of Family Medicine  [employer]  Restaurant manager, fast food

## 2022-05-06 NOTE — Assessment & Plan Note (Addendum)
Continues to have menopausal vasomotor instability. She is taking gabapentin,, minimally efficacious, I have suggested she look into Veoz[place] although she can live with the symptoms we can just watch it. She will let me know.

## 2022-05-06 NOTE — Assessment & Plan Note (Signed)
Due for mammogram and colon cancer screening. Jennifer [name] has become very sedentary, we need to get her moving, she will start with 5 minutes 2-4 times a week and build to 30 minutes 3-5 times a week, I would like her to bring back a diary in the next 2 to 3 months.

## 2022-05-07 LAB — LIPID PANEL
Cholesterol: 159 mg/dL (ref <200)
HDL: 66 mg/dL (ref >=50)
LDL Cholesterol (Calc): 72 mg/dL (calc)
Non-HDL Cholesterol (Calc): 93 mg/dL (calc) (ref <130)
Total CHOL/HDL Ratio: 2.4 (calc) (ref <5.0)
Triglycerides: 129 mg/dL (ref <150)

## 2022-05-07 LAB — CBC
HCT: 46.9 % — ABNORMAL HIGH (ref 35.0–45.0)
Hemoglobin: 15.9 g/dL — ABNORMAL HIGH (ref 11.7–15.5)
MCH: 32.1 pg (ref 27.0–33.0)
MCHC: 33.9 g/dL (ref 32.0–36.0)
MCV: 94.6 fL (ref 80.0–100.0)
MPV: 9.8 fL (ref 7.5–12.5)
Platelets: 142 10*3/uL (ref 140–400)
RBC: 4.96 10*6/uL (ref 3.80–5.10)
RDW: 12.8 % (ref 11.0–15.0)
WBC: 4.8 10*3/uL (ref 3.8–10.8)

## 2022-05-07 LAB — TSH: TSH: 3.18 mIU/L (ref 0.40–4.50)

## 2022-05-07 LAB — COMPREHENSIVE METABOLIC PANEL
AG Ratio: 2 (calc) (ref 1.0–2.5)
ALT: 30 U/L — ABNORMAL HIGH (ref 6–29)
AST: 25 U/L (ref 10–35)
Albumin: 4.5 g/dL (ref 3.6–5.1)
Alkaline phosphatase (APISO): 61 U/L (ref 37–153)
BUN/Creatinine Ratio: 11 (calc) (ref 6–22)
BUN: 13 mg/dL (ref 7–25)
CO2: 30 mmol/L (ref 20–32)
Calcium: 9.2 mg/dL (ref 8.6–10.4)
Chloride: 103 mmol/L (ref 98–110)
Creat: 1.19 mg/dL — ABNORMAL HIGH (ref 0.50–1.05)
Globulin: 2.3 g/dL (calc) (ref 1.9–3.7)
Glucose, Bld: 167 mg/dL — ABNORMAL HIGH (ref 65–99)
Potassium: 4.3 mmol/L (ref 3.5–5.3)
Sodium: 142 mmol/L (ref 135–146)
Total Bilirubin: 0.6 mg/dL (ref 0.2–1.2)
Total Protein: 6.8 g/dL (ref 6.1–8.1)

## 2022-05-07 LAB — HEMOGLOBIN A1C
Hgb A1c MFr Bld: 6.1 % of total Hgb — ABNORMAL HIGH (ref <5.7)
Mean Plasma Glucose: 128 mg/dL
eAG (mmol/L): 7.1 mmol/L

## 2022-05-10 ENCOUNTER — Other Ambulatory Visit (HOSPITAL_COMMUNITY): Payer: Self-pay | Admitting: Psychiatry

## 2022-05-10 DIAGNOSIS — F332 Major depressive disorder, recurrent severe without psychotic features: Secondary | ICD-10-CM

## 2022-05-30 ENCOUNTER — Other Ambulatory Visit (HOSPITAL_COMMUNITY): Payer: Self-pay | Admitting: Psychiatry

## 2022-06-02 NOTE — Telephone Encounter (Signed)
Ordered for a month at this time. Please contact the patient to have a follow up with DR. Gilmore Laroche.

## 2022-06-07 ENCOUNTER — Other Ambulatory Visit: Payer: Self-pay | Admitting: Sports Medicine

## 2022-06-07 DIAGNOSIS — M797 Fibromyalgia: Secondary | ICD-10-CM

## 2022-06-14 ENCOUNTER — Encounter: Payer: Self-pay | Admitting: Sports Medicine

## 2022-06-14 DIAGNOSIS — M797 Fibromyalgia: Secondary | ICD-10-CM

## 2022-06-15 MED ORDER — GABAPENTIN 800 MG PO TABS
800.0000 mg | ORAL_TABLET | Freq: Two times a day (BID) | ORAL | 3 refills | Status: AC
Start: 2022-06-15 — End: ?

## 2022-06-18 ENCOUNTER — Other Ambulatory Visit (HOSPITAL_COMMUNITY): Payer: Self-pay | Admitting: Psychiatry

## 2022-06-18 DIAGNOSIS — F332 Major depressive disorder, recurrent severe without psychotic features: Secondary | ICD-10-CM

## 2022-06-27 ENCOUNTER — Encounter (HOSPITAL_COMMUNITY): Payer: Self-pay | Admitting: Psychiatry

## 2022-06-27 ENCOUNTER — Telehealth (INDEPENDENT_AMBULATORY_CARE_PROVIDER_SITE_OTHER): Payer: BC Managed Care – PPO | Admitting: Psychiatry

## 2022-06-27 DIAGNOSIS — F332 Major depressive disorder, recurrent severe without psychotic features: Secondary | ICD-10-CM

## 2022-06-27 DIAGNOSIS — M797 Fibromyalgia: Secondary | ICD-10-CM | POA: Diagnosis not present

## 2022-06-27 DIAGNOSIS — F5102 Adjustment insomnia: Secondary | ICD-10-CM | POA: Diagnosis not present

## 2022-06-27 DIAGNOSIS — F411 Generalized anxiety disorder: Secondary | ICD-10-CM

## 2022-06-27 MED ORDER — PAROXETINE HCL 20 MG PO TABS
ORAL_TABLET | ORAL | 0 refills | Status: DC
Start: 2022-06-27 — End: 2022-11-01

## 2022-06-27 MED ORDER — LAMOTRIGINE 200 MG PO TABS: 200.0000 mg | ORAL_TABLET | Freq: Every day | ORAL | 2 refills | Status: DC

## 2022-06-27 NOTE — Progress Notes (Signed)
Patient ID: Jennifer [name], female   DOB: 10/02/1960, 62 y.o.   MRN: [id number]   [facility] Follow-up Outpatient Visit Telepsych visit Jennifer [name] [birth year]  Date:06/27/2022    Virtual Visit via Video Note  I connected with Jennifer [name] on 06/27/22 at 3:45 PM a video enabled telemedicine application and verified that I am speaking with the correct person using two identifiers.  Location: Patient: home Provider: home office   I discussed the limitations of evaluation and management by telemedicine and the availability of in person appointments. The patient expressed understanding and agreed to proceed.     I discussed the assessment and treatment plan with the patient. The patient was provided an opportunity to ask questions and all were answered. The patient agreed with the plan and demonstrated an understanding of the instructions.   The patient was advised to call back or seek an in-person evaluation if the symptoms worsen or if the condition fails to improve as anticipated.  I provided 15 minutes of non-face-to-face time during this encounter.     Chief complaint : depression follow up     HPI:    Jennifer [name] is a 62  year old female with a diagnosis of major Depressive Disorder. 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   Did better this spring, but summer heat flares up fibro myalgia and she stays in bed for some days to recover  Has a puppy has been helpful for support Husband also supportive Feels meds are ok with current doses    Depression: baseline  Modifying factor:husband Severity manageable      Review of Systems  Cardiovascular:  Negative for chest pain.  Musculoskeletal:  Positive for myalgias.  Psychiatric/Behavioral:  Negative for substance abuse and suicidal ideas.      Physical Exam  Vitals reviewed.  Constitutional: She appears well-developed and well-nourished. No distress.  Skin: She is not  diaphoretic.     Past Medical History: Reviewed.  Past Medical History   Diagnosis  Date     Discoid lupus      Thyroid disease      Perimenopausal      Fibromyalgia      CFS (chronic fatigue syndrome)      Sleep apnea      Allergies: Reviewed.  Allergies   Allergen  Reactions     Acetaminophen      Pt states she is unable to take this bc of Hashimoto Thyroid     Hydrocodone-Acetaminophen      REACTION: agitation     Oxycodone      Hallucinating, sweating, itching    Current Medications: Reviewed.  Current Outpatient Medications on File Prior to Visit  Medication Sig Dispense Refill   albuterol (PROVENTIL HFA) 108 (90 Base) MCG/ACT inhaler Inhale 1-2 puffs into the lungs every 4 (four) hours as needed for wheezing or shortness of breath. 1 Inhaler 3   albuterol (PROVENTIL) (2.5 MG/3ML) 0.083% nebulizer solution Take 3 mLs (2.5 mg total) by nebulization every 6 (six) hours as needed for wheezing or shortness of breath. 75 mL 6   atorvastatin (LIPITOR) 40 MG tablet TAKE 1 TABLET (40 MG TOTAL) BY MOUTH DAILY. NEED APPT IN [place]. 90 tablet 0   fluticasone (FLONASE) 50 MCG/ACT nasal spray One spray in each nostril twice a day, use left hand for right nostril, and right hand for left nostril. 48 g 3   Fluticasone-Umeclidin-Vilant (TRELEGY ELLIPTA) 100-62.5-25 MCG/ACT AEPB TAKE 1 PUFF BY MOUTH  EVERY DAY 60 each 11   gabapentin (NEURONTIN) 800 MG tablet Take 1 tablet (800 mg total) by mouth 2 (two) times daily. 180 tablet 3   HYDROcodone-acetaminophen (NORCO) 10-325 MG tablet Take 1 tablet by mouth 4 (four) times daily as needed.     ketotifen (ZADITOR) 0.025 % ophthalmic solution Place 1 drop into both eyes 2 (two) times daily. 5 mL 0   levothyroxine (SYNTHROID) 150 MCG tablet Take 1 tablet (150 mcg total) by mouth daily before breakfast. Needs labs. 90 tablet 3   nystatin (MYCOSTATIN) 100000 UNIT/ML suspension Swish and spit 5 mL 4 times daily if thrush develops 60 mL 11    ondansetron (ZOFRAN-ODT) 8 MG disintegrating tablet Take 1 tablet (8 mg total) by mouth every 8 (eight) hours as needed for nausea. 20 tablet 3   Phenazopyridine HCl (AZO TABS PO) Take 1 tablet by mouth daily.     traZODone (DESYREL) 100 MG tablet TAKE 2 TO 3 TABS AT NIGHT FOR SLEEP 270 tablet 0   No current facility-administered medications on file prior to visit.       Family History: Reviewed.  Family History   Problem  Relation  Age of Onset     Hypertension  Father      Heart disease  Sister  21      heart attack      Depression  Brother      Cancer  Mother  90      colon    Psychiatric Specialty Exam: Appearance : casual  Eye contact fair  Speech:  Clear and Coherent and Normal Rate  Volume:  Normal  Mood: fair  Affect:   Thought Process:  clear  Orientation:  Full (Time, Place, and Person)  Thought Content:  WDL  Suicidal Thoughts:  No  Homicidal Thoughts:  No  Memory: somewhat subdued  Judgement:  Good  Insight:  Fair  Psychomotor Activity:  decreased  Concentration:  Fair  Recall:  Poor  Akathisia:  No  Handed:  Right  Fund of knowledge-average to above average  Language-Intact  AIMS (if indicated):     Assets:  Manufacturing systems engineer Desire for Improvement Financial Resources/Insurance Housing Intimacy Leisure Time Physical Health Resilience Social Support Energy manager  Language intact   Fund of knowledge is good        Treatment Plan/Recommendations:   Prior documentation reviewed   Major depression; gets subdued due to fibro at times overll managing it fair, continue meds   No rash on lamictal    Discussed to look for positive things during the day to keep engaged Anxiety disorder: fluctuates, continue paxil, also on gaba  fibromyalgia;:flares up at times, paxil increase has helped at times it may fluctutate  Also takes gabapentin   Insomnia; fair on trazadone, work on sleep hygiene Fu  44m.  Thresa Ross, MD

## 2022-07-02 ENCOUNTER — Other Ambulatory Visit: Payer: Self-pay | Admitting: Sports Medicine

## 2022-07-02 DIAGNOSIS — E785 Hyperlipidemia, unspecified: Secondary | ICD-10-CM

## 2022-07-28 DIAGNOSIS — M5030 Other cervical disc degeneration, unspecified cervical region: Secondary | ICD-10-CM | POA: Diagnosis not present

## 2022-07-28 DIAGNOSIS — Z79899 Other long term (current) drug therapy: Secondary | ICD-10-CM | POA: Diagnosis not present

## 2022-07-28 DIAGNOSIS — R110 Nausea: Secondary | ICD-10-CM | POA: Diagnosis not present

## 2022-07-28 DIAGNOSIS — G894 Chronic pain syndrome: Secondary | ICD-10-CM | POA: Diagnosis not present

## 2022-07-28 DIAGNOSIS — Z5181 Encounter for therapeutic drug level monitoring: Secondary | ICD-10-CM | POA: Diagnosis not present

## 2022-07-28 DIAGNOSIS — M797 Fibromyalgia: Secondary | ICD-10-CM | POA: Diagnosis not present

## 2022-08-04 ENCOUNTER — Other Ambulatory Visit: Payer: Self-pay | Admitting: Sports Medicine

## 2022-08-04 DIAGNOSIS — E039 Hypothyroidism, unspecified: Secondary | ICD-10-CM

## 2022-08-09 ENCOUNTER — Ambulatory Visit: Payer: BC Managed Care – PPO | Admitting: Medical-Surgical

## 2022-09-11 ENCOUNTER — Other Ambulatory Visit (HOSPITAL_COMMUNITY): Payer: Self-pay | Admitting: Psychiatry

## 2022-09-11 DIAGNOSIS — F332 Major depressive disorder, recurrent severe without psychotic features: Secondary | ICD-10-CM

## 2022-09-20 ENCOUNTER — Telehealth (HOSPITAL_COMMUNITY): Payer: Self-pay | Admitting: *Deleted

## 2022-09-20 ENCOUNTER — Other Ambulatory Visit (HOSPITAL_COMMUNITY): Payer: Self-pay | Admitting: Psychiatry

## 2022-09-20 MED ORDER — LAMOTRIGINE 200 MG PO TABS: 200.0000 mg | ORAL_TABLET | Freq: Every day | ORAL | 2 refills | Status: DC

## 2022-09-20 NOTE — Addendum Note (Signed)
Addended by: Thresa Ross on: 09/20/2022 05:57 PM   Modules accepted: Orders

## 2022-09-20 NOTE — Telephone Encounter (Signed)
Patient Refill Request-- [id number] Jennifer [name],   - [address]   Disp Refills Start End   lamoTRIgine (LAMICTAL) 200 MG tablet       Sig - Route: Take 1 tablet (200 mg total) by mouth daily   Next appt 12/818/24 Last appt  06/27/22

## 2022-09-21 ENCOUNTER — Other Ambulatory Visit (HOSPITAL_COMMUNITY): Payer: Self-pay | Admitting: Psychiatry

## 2022-09-26 ENCOUNTER — Other Ambulatory Visit: Payer: Self-pay | Admitting: Sports Medicine

## 2022-09-26 DIAGNOSIS — E785 Hyperlipidemia, unspecified: Secondary | ICD-10-CM

## 2022-11-01 ENCOUNTER — Other Ambulatory Visit (HOSPITAL_COMMUNITY): Payer: Self-pay | Admitting: Psychiatry

## 2022-11-01 DIAGNOSIS — F332 Major depressive disorder, recurrent severe without psychotic features: Secondary | ICD-10-CM

## 2022-11-02 ENCOUNTER — Ambulatory Visit: Payer: BC Managed Care – PPO

## 2022-11-02 DIAGNOSIS — Z1231 Encounter for screening mammogram for malignant neoplasm of breast: Secondary | ICD-10-CM

## 2022-11-07 ENCOUNTER — Other Ambulatory Visit: Payer: Self-pay | Admitting: Sports Medicine

## 2022-11-07 DIAGNOSIS — M797 Fibromyalgia: Secondary | ICD-10-CM | POA: Diagnosis not present

## 2022-11-07 DIAGNOSIS — M5030 Other cervical disc degeneration, unspecified cervical region: Secondary | ICD-10-CM | POA: Diagnosis not present

## 2022-11-07 DIAGNOSIS — R928 Other abnormal and inconclusive findings on diagnostic imaging of breast: Secondary | ICD-10-CM

## 2022-11-07 DIAGNOSIS — G894 Chronic pain syndrome: Secondary | ICD-10-CM | POA: Diagnosis not present

## 2022-11-07 DIAGNOSIS — M47816 Spondylosis without myelopathy or radiculopathy, lumbar region: Secondary | ICD-10-CM | POA: Diagnosis not present

## 2022-11-24 ENCOUNTER — Ambulatory Visit
Admission: RE | Admit: 2022-11-24 | Discharge: 2022-11-24 | Disposition: A | Discharge status: Home / Self Care | Payer: BC Managed Care – PPO | Source: Ambulatory Visit | Attending: Sports Medicine | Admitting: Sports Medicine

## 2022-11-24 ENCOUNTER — Other Ambulatory Visit: Payer: Self-pay | Admitting: Sports Medicine

## 2022-11-24 DIAGNOSIS — R928 Other abnormal and inconclusive findings on diagnostic imaging of breast: Secondary | ICD-10-CM

## 2022-11-24 DIAGNOSIS — N6314 Unspecified lump in the right breast, lower inner quadrant: Secondary | ICD-10-CM | POA: Diagnosis not present

## 2022-11-24 DIAGNOSIS — N6310 Unspecified lump in the right breast, unspecified quadrant: Secondary | ICD-10-CM

## 2022-11-30 ENCOUNTER — Ambulatory Visit
Admission: RE | Admit: 2022-11-30 | Discharge: 2022-11-30 | Disposition: A | Discharge status: Home / Self Care | Payer: BC Managed Care – PPO | Source: Ambulatory Visit | Attending: Sports Medicine | Admitting: Sports Medicine

## 2022-11-30 DIAGNOSIS — N6310 Unspecified lump in the right breast, unspecified quadrant: Secondary | ICD-10-CM

## 2022-11-30 DIAGNOSIS — N6314 Unspecified lump in the right breast, lower inner quadrant: Secondary | ICD-10-CM | POA: Diagnosis not present

## 2022-12-21 ENCOUNTER — Encounter (HOSPITAL_COMMUNITY): Payer: Self-pay | Admitting: Psychiatry

## 2022-12-21 ENCOUNTER — Telehealth (HOSPITAL_COMMUNITY): Payer: BC Managed Care – PPO | Admitting: Psychiatry

## 2022-12-21 DIAGNOSIS — F411 Generalized anxiety disorder: Secondary | ICD-10-CM

## 2022-12-21 DIAGNOSIS — F5102 Adjustment insomnia: Secondary | ICD-10-CM | POA: Diagnosis not present

## 2022-12-21 DIAGNOSIS — M797 Fibromyalgia: Secondary | ICD-10-CM | POA: Diagnosis not present

## 2022-12-21 DIAGNOSIS — F332 Major depressive disorder, recurrent severe without psychotic features: Secondary | ICD-10-CM | POA: Diagnosis not present

## 2022-12-21 MED ORDER — PAROXETINE HCL 20 MG PO TABS: ORAL_TABLET | ORAL | 0 refills | Status: DC

## 2022-12-21 MED ORDER — TRAZODONE HCL 100 MG PO TABS: ORAL_TABLET | ORAL | 0 refills | Status: DC

## 2022-12-21 MED ORDER — LAMOTRIGINE 200 MG PO TABS: 200.0000 mg | ORAL_TABLET | Freq: Every day | ORAL | 2 refills | Status: DC

## 2022-12-21 NOTE — Progress Notes (Signed)
Patient ID: Jennifer [name], female   DOB: 10-04-1960, 62 y.o.   MRN: [id number]   [facility] Follow-up Outpatient Visit Telepsych visit Jennifer [name] [birth year]  Date:12/21/2022      Virtual Visit via Video Note  I connected with Jennifer [name] on 12/21/22 at  2:30 PM EST by a video enabled telemedicine application and verified that I am speaking with the correct person using two identifiers.  Location: Patient: home Provider: home office   I discussed the limitations of evaluation and management by telemedicine and the availability of in person appointments. The patient expressed understanding and agreed to proceed.     I discussed the assessment and treatment plan with the patient. The patient was provided an opportunity to ask questions and all were answered. The patient agreed with the plan and demonstrated an understanding of the instructions.   The patient was advised to call back or seek an in-person evaluation if the symptoms worsen or if the condition fails to improve as anticipated.  I provided 20 minutes of non-face-to-face time during this encounter.     Chief complaint : depression follow up     HPI:    Jennifer [name] is a 62  year old female with a diagnosis of major Depressive Disorder. 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    Has been doing baseline, fatigue or fibro flares up at times, stays in bed says gets fatigue  Otherwise mood is fair and depression is manageable except when feeling fatigue  Is on gaba from pcp     Depression: baseline  Modifying factor:husband, puppy Severity basline        Review of Systems  Cardiovascular:  Negative for chest pain.  Musculoskeletal:  Positive for myalgias.  Psychiatric/Behavioral:  Negative for substance abuse and suicidal ideas.      Physical Exam  Vitals reviewed.  Constitutional: She appears well-developed and well-nourished. No distress.  Skin: She is not  diaphoretic.     Past Medical History: Reviewed.  Past Medical History   Diagnosis  Date     Discoid lupus      Thyroid disease      Perimenopausal      Fibromyalgia      CFS (chronic fatigue syndrome)      Sleep apnea      Allergies: Reviewed.  Allergies   Allergen  Reactions     Acetaminophen      Pt states she is unable to take this bc of Hashimoto Thyroid     Hydrocodone-Acetaminophen      REACTION: agitation     Oxycodone      Hallucinating, sweating, itching    Current Medications: Reviewed.  Current Outpatient Medications on File Prior to Visit  Medication Sig Dispense Refill   albuterol (PROVENTIL HFA) 108 (90 Base) MCG/ACT inhaler Inhale 1-2 puffs into the lungs every 4 (four) hours as needed for wheezing or shortness of breath. 1 Inhaler 3   albuterol (PROVENTIL) (2.5 MG/3ML) 0.083% nebulizer solution Take 3 mLs (2.5 mg total) by nebulization every 6 (six) hours as needed for wheezing or shortness of breath. 75 mL 6   atorvastatin (LIPITOR) 40 MG tablet TAKE 1 TABLET (40 MG TOTAL) BY MOUTH DAILY. NEED APPT IN [place]. 90 tablet 0   fluticasone (FLONASE) 50 MCG/ACT nasal spray One spray in each nostril twice a day, use left hand for right nostril, and right hand for left nostril. 48 g 3   Fluticasone-Umeclidin-Vilant (TRELEGY ELLIPTA) 100-62.5-25 MCG/ACT  AEPB TAKE 1 PUFF BY MOUTH EVERY DAY 60 each 11   gabapentin (NEURONTIN) 800 MG tablet Take 1 tablet (800 mg total) by mouth 2 (two) times daily. 180 tablet 3   HYDROcodone-acetaminophen (NORCO) 10-325 MG tablet Take 1 tablet by mouth 4 (four) times daily as needed.     ketotifen (ZADITOR) 0.025 % ophthalmic solution Place 1 drop into both eyes 2 (two) times daily. 5 mL 0   levothyroxine (SYNTHROID) 150 MCG tablet TAKE 1 TABLET (150 MCG TOTAL) BY MOUTH DAILY BEFORE BREAKFAST. NEEDS LABS. 90 tablet 3   nystatin (MYCOSTATIN) 100000 UNIT/ML suspension Swish and spit 5 mL 4 times daily if thrush develops 60 mL 11    ondansetron (ZOFRAN-ODT) 8 MG disintegrating tablet Take 1 tablet (8 mg total) by mouth every 8 (eight) hours as needed for nausea. 20 tablet 3   Phenazopyridine HCl (AZO TABS PO) Take 1 tablet by mouth daily.     No current facility-administered medications on file prior to visit.       Family History: Reviewed.  Family History   Problem  Relation  Age of Onset     Hypertension  Father      Heart disease  Sister  58      heart attack      Depression  Brother      Cancer  Mother  45      colon    Psychiatric Specialty Exam: Appearance : casual  Eye contact fair  Speech:  Clear and Coherent and Normal Rate  Volume:  Normal  Mood: fair  Affect:   Thought Process:  clear  Orientation:  Full (Time, Place, and Person)  Thought Content:  WDL  Suicidal Thoughts:  No  Homicidal Thoughts:  No  Memory: somewhat subdued  Judgement:  Good  Insight:  Fair  Psychomotor Activity:  decreased  Concentration:  Fair  Recall:  Poor  Akathisia:  No  Handed:  Right  Fund of knowledge-average to above average  Language-Intact  AIMS (if indicated):     Assets:  Manufacturing systems engineer Desire for Improvement Financial Resources/Insurance Housing Intimacy Leisure Time Physical Health Resilience Social Support Energy manager  Language intact   Fund of knowledge is good        Treatment Plan/Recommendations:   Prior documentation reviewedl   Major depression;gets subdued at times but feels meds keep balance, continue lamictal, no rash  Also on gaba from pcp    Discussed to look for positive things during the day to keep engaged Anxiety disorder: fair, continue gaba also on paxil continue and work on distractions  fibromyalgia;: flares up at times, is on gaba . Continue paxil   Insomnia; manageable on trazadone, will continue   Fu 48m.  Thresa Ross, MD

## 2022-12-31 ENCOUNTER — Other Ambulatory Visit: Payer: Self-pay | Admitting: Sports Medicine

## 2022-12-31 DIAGNOSIS — E785 Hyperlipidemia, unspecified: Secondary | ICD-10-CM

## 2023-01-03 ENCOUNTER — Ambulatory Visit: Payer: BC Managed Care – PPO | Admitting: Sports Medicine

## 2023-01-08 ENCOUNTER — Other Ambulatory Visit (HOSPITAL_COMMUNITY): Payer: Self-pay | Admitting: Psychiatry

## 2023-01-10 ENCOUNTER — Telehealth: Payer: Self-pay

## 2023-01-10 NOTE — Telephone Encounter (Signed)
 Patient advised she has refills from the last prescription. I confirmed it with the pharmacy.

## 2023-01-10 NOTE — Telephone Encounter (Signed)
 Copied from CRM [id number]. Topic: Clinical - Prescription Issue >> Jan 09, 2023  2:39 PM Joesph PARAS wrote: Reason for CRM: gabapentin  (NEURONTIN ) 800 MG tablet - Patient is requesting a refill. Pharmacy has not responded to patient and Mychart will not allow her to refill. Patient seeking advice on how to more forward with getting prescription. Patient is out of medication as of 01/09/2023.

## 2023-01-27 DIAGNOSIS — Z5181 Encounter for therapeutic drug level monitoring: Secondary | ICD-10-CM | POA: Diagnosis not present

## 2023-01-27 DIAGNOSIS — M797 Fibromyalgia: Secondary | ICD-10-CM | POA: Diagnosis not present

## 2023-01-27 DIAGNOSIS — M5030 Other cervical disc degeneration, unspecified cervical region: Secondary | ICD-10-CM | POA: Diagnosis not present

## 2023-01-27 DIAGNOSIS — Z79899 Other long term (current) drug therapy: Secondary | ICD-10-CM | POA: Diagnosis not present

## 2023-01-27 DIAGNOSIS — M47816 Spondylosis without myelopathy or radiculopathy, lumbar region: Secondary | ICD-10-CM | POA: Diagnosis not present

## 2023-01-27 DIAGNOSIS — G894 Chronic pain syndrome: Secondary | ICD-10-CM | POA: Diagnosis not present

## 2023-01-30 ENCOUNTER — Ambulatory Visit (INDEPENDENT_AMBULATORY_CARE_PROVIDER_SITE_OTHER): Payer: BC Managed Care – PPO | Admitting: Sports Medicine

## 2023-01-30 VITALS — BP 132/68 | HR 89

## 2023-01-30 DIAGNOSIS — E785 Hyperlipidemia, unspecified: Secondary | ICD-10-CM | POA: Diagnosis not present

## 2023-01-30 DIAGNOSIS — E039 Hypothyroidism, unspecified: Secondary | ICD-10-CM

## 2023-01-30 DIAGNOSIS — D696 Thrombocytopenia, unspecified: Secondary | ICD-10-CM

## 2023-01-30 DIAGNOSIS — K582 Mixed irritable bowel syndrome: Secondary | ICD-10-CM

## 2023-01-30 DIAGNOSIS — Z0000 Encounter for general adult medical examination without abnormal findings: Secondary | ICD-10-CM | POA: Diagnosis not present

## 2023-01-30 MED ORDER — HYOSCYAMINE SULFATE ER 0.375 MG PO TB12: 0.3750 mg | ORAL_TABLET | Freq: Two times a day (BID) | ORAL | 0 refills | Status: DC

## 2023-01-30 NOTE — Progress Notes (Addendum)
    Procedures performed today:    None.  Independent interpretation of notes and tests performed by another provider:   None.  Brief History, Exam, Impression, and Recommendations:    Mixed irritable bowel syndrome Pleasant 64 year old female, many many months of abdominal discomfort, she endorses left lower quadrant pressure and pain after eating, this typically resolves after having a large bowel movement, sometimes diarrhea, often 4 times per day. No blood currently, no fevers, chills, has not eaten anything out of the ordinary. She does have a history of IBS, predominantly diarrheal, a CT scan was negative and relevant blood work was negative in the past (2016). I explained the anatomy and pathophysiology of mixed irritable bowel syndrome, she tells me she gets bouts of being constipated and bouts of having diarrhea. We will start a low FODMAP dietary plan, as she is having more of diarrhea predominance we will add a GI antispasmodic, Levbid , she will increase the fiber in her diet. I would like to see her back in about 6 weeks, she does understand we may have to tailor her treatment plan based on whether she is in a diarrhea predominant phase or a constipation predominant phase.  If she becomes more constipated she will stop the Levbid  and we can try Linzess .  Annual physical exam We will catch Loyd up on her screening measures at the follow-up, we did order mammogram and colon cancer screening at the last visit.  Hypothyroidism TSH elevated, increasing levothyroxine  to 175 mcg. Recheck in 6 weeks.  Thrombocytopenia (HCC) Newly noted thrombocytopenia with transaminitis, we will go and image her liver.  I spent 30 minutes of total time managing this patient today, this includes chart review, face to face, and non-face to face time.  ____________________________________________ [name], M.D., ABFM., CAQSM., AME. Primary Care and Sports Medicine [place]  [facility]  Adjunct Professor of [facility] Medicine  [employer]  Restaurant Manager, [employer]

## 2023-01-30 NOTE — Assessment & Plan Note (Signed)
We will catch Jennifer [name] up on her screening measures at the follow-up, we did order mammogram and colon cancer screening at the last visit.

## 2023-01-30 NOTE — Assessment & Plan Note (Signed)
Pleasant 63 year old female, many many months of abdominal discomfort, she endorses left lower quadrant pressure and pain after eating, this typically resolves after having a large bowel movement, sometimes diarrhea, often 4 times per day. No blood currently, no fevers, chills, has not eaten anything out of the ordinary. She does have a history of IBS, predominantly diarrheal, a CT scan was negative and relevant blood work was negative in the past (2016). I explained the anatomy and pathophysiology of mixed irritable bowel syndrome, she tells me she gets bouts of being constipated and bouts of having diarrhea. We will start a low FODMAP dietary plan, as she is having more of diarrhea predominance we will add a GI antispasmodic, Levbid, she will increase the fiber in her diet. I would like to see her back in about 6 weeks, she does understand we may have to tailor her treatment plan based on whether she is in a diarrhea predominant phase or a constipation predominant phase.  If she becomes more constipated she will stop the Levbid and we can try Linzess.

## 2023-01-31 ENCOUNTER — Encounter: Payer: Self-pay | Admitting: Sports Medicine

## 2023-01-31 DIAGNOSIS — D696 Thrombocytopenia, unspecified: Secondary | ICD-10-CM | POA: Insufficient documentation

## 2023-01-31 LAB — COMPREHENSIVE METABOLIC PANEL
ALT: 34 [IU]/L — ABNORMAL HIGH (ref 0–32)
AST: 32 [IU]/L (ref 0–40)
Albumin: 4.2 g/dL (ref 3.9–4.9)
Alkaline Phosphatase: 72 [IU]/L (ref 44–121)
BUN/Creatinine Ratio: 9 — ABNORMAL LOW (ref 12–28)
BUN: 9 mg/dL (ref 8–27)
Bilirubin Total: 0.4 mg/dL (ref 0.0–1.2)
CO2: 25 mmol/L (ref 20–29)
Calcium: 8.7 mg/dL (ref 8.7–10.3)
Chloride: 103 mmol/L (ref 96–106)
Creatinine, Ser: 1.03 mg/dL — ABNORMAL HIGH (ref 0.57–1.00)
Globulin, Total: 2.1 g/dL (ref 1.5–4.5)
Glucose: 165 mg/dL — ABNORMAL HIGH (ref 70–99)
Potassium: 4.5 mmol/L (ref 3.5–5.2)
Sodium: 143 mmol/L (ref 134–144)
Total Protein: 6.3 g/dL (ref 6.0–8.5)
eGFR: 60 mL/min/{1.73_m2} (ref >59)

## 2023-01-31 LAB — CBC
Hematocrit: 46.5 % (ref 34.0–46.6)
Hemoglobin: 15.9 g/dL (ref 11.1–15.9)
MCH: 32.9 pg (ref 26.6–33.0)
MCHC: 34.2 g/dL (ref 31.5–35.7)
MCV: 96 fL (ref 79–97)
Platelets: 134 10*3/uL — ABNORMAL LOW (ref 150–450)
RBC: 4.84 x10E6/uL (ref 3.77–5.28)
RDW: 12.3 % (ref 11.7–15.4)
WBC: 5.1 10*3/uL (ref 3.4–10.8)

## 2023-01-31 LAB — LIPID PANEL
Chol/HDL Ratio: 2.3 {ratio} (ref 0.0–4.4)
Cholesterol, Total: 160 mg/dL (ref 100–199)
HDL: 70 mg/dL (ref >39)
LDL Chol Calc (NIH): 73 mg/dL (ref 0–99)
Triglycerides: 92 mg/dL (ref 0–149)
VLDL Cholesterol Cal: 17 mg/dL (ref 5–40)

## 2023-01-31 LAB — HEMOGLOBIN A1C
Est. average glucose Bld gHb Est-mCnc: 131 mg/dL
Hgb A1c MFr Bld: 6.2 % — ABNORMAL HIGH (ref 4.8–5.6)

## 2023-01-31 LAB — TSH: TSH: 5.19 u[IU]/mL — ABNORMAL HIGH (ref 0.450–4.500)

## 2023-01-31 MED ORDER — LEVOTHYROXINE SODIUM 175 MCG PO TABS: 175.0000 ug | ORAL_TABLET | Freq: Every day | ORAL | 11 refills | Status: AC

## 2023-01-31 NOTE — Addendum Note (Signed)
Addended by: Monica Becton on: 01/31/2023 08:45 AM   Modules accepted: Orders

## 2023-01-31 NOTE — Assessment & Plan Note (Signed)
TSH elevated, increasing levothyroxine to 175 mcg. Recheck in 6 weeks.

## 2023-01-31 NOTE — Assessment & Plan Note (Signed)
Newly noted thrombocytopenia with transaminitis, we will go and image her liver.

## 2023-02-20 ENCOUNTER — Encounter (INDEPENDENT_AMBULATORY_CARE_PROVIDER_SITE_OTHER): Payer: Self-pay | Admitting: Sports Medicine

## 2023-02-20 DIAGNOSIS — J411 Mucopurulent chronic bronchitis: Secondary | ICD-10-CM | POA: Diagnosis not present

## 2023-02-21 NOTE — Addendum Note (Signed)
Addended by: Monica Becton on: 02/21/2023 09:50 AM   Modules accepted: Level of Service

## 2023-02-22 ENCOUNTER — Other Ambulatory Visit: Payer: Self-pay | Admitting: Sports Medicine

## 2023-02-22 DIAGNOSIS — K582 Mixed irritable bowel syndrome: Secondary | ICD-10-CM

## 2023-03-17 ENCOUNTER — Other Ambulatory Visit (HOSPITAL_COMMUNITY): Payer: Self-pay | Admitting: Psychiatry

## 2023-03-17 DIAGNOSIS — F332 Major depressive disorder, recurrent severe without psychotic features: Secondary | ICD-10-CM

## 2023-03-20 ENCOUNTER — Encounter: Payer: Self-pay | Admitting: Sports Medicine

## 2023-03-20 ENCOUNTER — Telehealth (HOSPITAL_COMMUNITY): Payer: Self-pay | Admitting: *Deleted

## 2023-03-20 ENCOUNTER — Other Ambulatory Visit (HOSPITAL_COMMUNITY): Payer: Self-pay | Admitting: *Deleted

## 2023-03-20 DIAGNOSIS — F332 Major depressive disorder, recurrent severe without psychotic features: Secondary | ICD-10-CM

## 2023-03-20 MED ORDER — TIOTROPIUM BROMIDE MONOHYDRATE 18 MCG IN CAPS
18.0000 ug | ORAL_CAPSULE | Freq: Every day | RESPIRATORY_TRACT | 12 refills | Status: DC
Start: 2023-03-20 — End: 2023-11-13

## 2023-03-20 MED ORDER — FLUTICASONE PROPIONATE HFA 110 MCG/ACT IN AERO: 1.0000 | INHALATION_SPRAY | Freq: Two times a day (BID) | RESPIRATORY_TRACT | 12 refills | Status: DC

## 2023-03-20 MED ORDER — PAROXETINE HCL 20 MG PO TABS: ORAL_TABLET | ORAL | 0 refills | Status: DC

## 2023-03-20 NOTE — Telephone Encounter (Signed)
 Rx [facility]FILL [facility]QUEST [id number] Lorenza Evangelist, [place] - [address]   PARoxetine (PAXIL) 20 MG tablet  DATE LAST FILL 01/27/23  Next appt ??? Last appt  12/21/22

## 2023-03-20 NOTE — Assessment & Plan Note (Signed)
 Previous history: Recurrent episodes of bronchitis, she has had normal spirometry several times, persistent cough, improved with Trelegy. She does need occasional courses of prednisone. Pulse ox remains in the lower 90s, we will do a walking test at the follow-up. Historically imaging has been unrevealing. We may consider a chest CT at the follow-up visit as well. I do suspect the majority is related to body habitus.  Better with Trelegy however Trelegy is becoming too expensive with her current new Medicare plan, we will try Spiriva and fluticasone a separate inhalers.

## 2023-03-20 NOTE — Telephone Encounter (Signed)
 PROVIDER AUTHORIZED REFILL PARoxetine (PAXIL) 20 MG tablet  SENT CO SIGN

## 2023-03-20 NOTE — Telephone Encounter (Signed)

## 2023-03-22 ENCOUNTER — Telehealth (HOSPITAL_COMMUNITY): Payer: Self-pay | Admitting: *Deleted

## 2023-03-22 NOTE — Telephone Encounter (Signed)
 PATIENT [facility]LL [facility]QUEST [id number] Jennifer [name], [place] - [address]   lamoTRIgine (LAMICTAL) 200 MG tablet   Next appt 04/24/23 Last appt  12/21/22

## 2023-03-23 MED ORDER — LAMOTRIGINE 200 MG PO TABS: 200.0000 mg | ORAL_TABLET | Freq: Every day | ORAL | 0 refills | Status: DC

## 2023-03-23 NOTE — Addendum Note (Signed)
 Addended by: Thresa Ross on: 03/23/2023 11:00 AM   Modules accepted: Orders

## 2023-04-04 ENCOUNTER — Other Ambulatory Visit: Payer: Self-pay | Admitting: Sports Medicine

## 2023-04-04 DIAGNOSIS — E785 Hyperlipidemia, unspecified: Secondary | ICD-10-CM

## 2023-04-24 ENCOUNTER — Encounter (HOSPITAL_COMMUNITY): Payer: Self-pay | Admitting: Psychiatry

## 2023-04-24 ENCOUNTER — Telehealth (HOSPITAL_COMMUNITY): Admitting: Psychiatry

## 2023-04-24 DIAGNOSIS — F329 Major depressive disorder, single episode, unspecified: Secondary | ICD-10-CM

## 2023-04-24 DIAGNOSIS — F332 Major depressive disorder, recurrent severe without psychotic features: Secondary | ICD-10-CM

## 2023-04-24 DIAGNOSIS — G4700 Insomnia, unspecified: Secondary | ICD-10-CM | POA: Diagnosis not present

## 2023-04-24 DIAGNOSIS — M797 Fibromyalgia: Secondary | ICD-10-CM

## 2023-04-24 DIAGNOSIS — F419 Anxiety disorder, unspecified: Secondary | ICD-10-CM

## 2023-04-24 DIAGNOSIS — F411 Generalized anxiety disorder: Secondary | ICD-10-CM

## 2023-04-24 MED ORDER — TRAZODONE HCL 100 MG PO TABS: ORAL_TABLET | ORAL | 0 refills | Status: DC

## 2023-04-24 MED ORDER — LAMOTRIGINE 200 MG PO TABS: 200.0000 mg | ORAL_TABLET | Freq: Every day | ORAL | 0 refills | Status: DC

## 2023-04-24 MED ORDER — PAROXETINE HCL 20 MG PO TABS: ORAL_TABLET | ORAL | 0 refills | Status: DC

## 2023-04-24 NOTE — Progress Notes (Signed)
 Patient ID: Jennifer [name], female   DOB: Mar 05, 1960, 63 y.o.   MRN: [id number]   [facility] Follow-up Outpatient Visit Telepsych visit MARSHAE AZAM 09-04-1960  Date:04/24/2023    Virtual Visit via Video Note  I connected with Jennifer [name] on 04/24/23 at  4:00 PM EDT by a video enabled telemedicine application and verified that I am speaking with the correct person using two identifiers.  Location: Patient: home Provider: home office   I discussed the limitations of evaluation and management by telemedicine and the availability of in person appointments. The patient expressed understanding and agreed to proceed.      I discussed the assessment and treatment plan with the patient. The patient was provided an opportunity to ask questions and all were answered. The patient agreed with the plan and demonstrated an understanding of the instructions.   The patient was advised to call back or seek an in-person evaluation if the symptoms worsen or if the condition fails to improve as anticipated.  I provided 15 minutes of non-face-to-face time during this encounter.    Chief complaint : depression follow up     HPI:    Jennifer [name] is a 63  year old female with a diagnosis of major Depressive Disorder. 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    On eval endorses fatigue but mental health described as manageable and feels meds keep balance of depression,anxiety   No rash on lamictal  On gaba for fibro   Depression: baseline  Modifying factor:husband, puppy Severity baseline        Review of Systems  Cardiovascular:  Negative for chest pain.  Musculoskeletal:  Positive for myalgias.  Psychiatric/Behavioral:  Negative for substance abuse and suicidal ideas.      Physical Exam  Vitals reviewed.  Constitutional: She appears well-developed and well-nourished. No distress.  Skin: She is not diaphoretic.     Past Medical History:  Reviewed.  Past Medical History   Diagnosis  Date     Discoid lupus      Thyroid  disease      Perimenopausal      Fibromyalgia      CFS (chronic fatigue syndrome)      Sleep apnea      Allergies: Reviewed.  Allergies   Allergen  Reactions     Acetaminophen      Pt states she is unable to take this bc of Hashimoto Thyroid      Hydrocodone-Acetaminophen      REACTION: agitation     Oxycodone      Hallucinating, sweating, itching    Current Medications: Reviewed.  Current Outpatient Medications on File Prior to Visit  Medication Sig Dispense Refill   albuterol  (PROVENTIL  HFA) 108 (90 Base) MCG/ACT inhaler Inhale 1-2 puffs into the lungs every 4 (four) hours as needed for wheezing or shortness of breath. 1 Inhaler 3   albuterol  (PROVENTIL ) (2.5 MG/3ML) 0.083% nebulizer solution Take 3 mLs (2.5 mg total) by nebulization every 6 (six) hours as needed for wheezing or shortness of breath. 75 mL 6   atorvastatin  (LIPITOR) 40 MG tablet Take 1 tablet (40 mg total) by mouth daily. NEEDS APPOINTMENT FOR FURTHER REFILLS. 90 tablet 0   fluticasone  (FLONASE ) 50 MCG/ACT nasal spray One spray in each nostril twice a day, use left hand for right nostril, and right hand for left nostril. 48 g 3   fluticasone  (FLOVENT  HFA) 110 MCG/ACT inhaler Inhale 1 puff into the lungs 2 (two) times daily. 1  each 12   Fluticasone -Umeclidin-Vilant (TRELEGY ELLIPTA ) 100-62.5-25 MCG/ACT AEPB TAKE 1 PUFF BY MOUTH EVERY DAY 60 each 11   gabapentin  (NEURONTIN ) 800 MG tablet Take 1 tablet (800 mg total) by mouth 2 (two) times daily. 180 tablet 3   HYDROcodone-acetaminophen (NORCO) 10-325 MG tablet Take 1 tablet by mouth 4 (four) times daily as needed.     hyoscyamine  (LEVBID) 0.375 MG 12 hr tablet TAKE 1 TABLET (0.375 MG TOTAL) BY MOUTH 2 (TWO) TIMES DAILY. 180 tablet 1   ketotifen  (ZADITOR ) 0.025 % ophthalmic solution Place 1 drop into both eyes 2 (two) times daily. 5 mL 0   levothyroxine  (SYNTHROID ) 175 MCG tablet Take 1  tablet (175 mcg total) by mouth daily before breakfast. Needs labs. 30 tablet 11   nystatin  (MYCOSTATIN ) 100000 UNIT/ML suspension Swish and spit 5 mL 4 times daily if thrush develops 60 mL 11   ondansetron  (ZOFRAN -ODT) 8 MG disintegrating tablet Take 1 tablet (8 mg total) by mouth every 8 (eight) hours as needed for nausea. 20 tablet 3   Phenazopyridine HCl (AZO TABS PO) Take 1 tablet by mouth daily.     tiotropium (SPIRIVA ) 18 MCG inhalation capsule Place 1 capsule (18 mcg total) into inhaler and inhale daily. 30 capsule 12   No current facility-administered medications on file prior to visit.       Family History: Reviewed.  Family History   Problem  Relation  Age of Onset     Hypertension  Father      Heart disease  Sister  66      heart attack      Depression  Brother      Cancer  Mother  17      colon    Psychiatric Specialty Exam: Appearance : casual  Eye contact fair  Speech:  Clear and Coherent and Normal Rate  Volume:  Normal  Mood: fair  Affect:   Thought Process:  clear  Orientation:  Full (Time, Place, and Person)  Thought Content:  WDL  Suicidal Thoughts:  No  Homicidal Thoughts:  No  Memory: somewhat subdued  Judgement:  Good  Insight:  Fair  Psychomotor Activity:  decreased  Concentration:  Fair  Recall:  Poor  Akathisia:  No  Handed:  Right  Fund of knowledge-average to above average  Language-Intact  AIMS (if indicated):     Assets:  Manufacturing systems engineer Desire for Improvement Financial Resources/Insurance Housing Intimacy Leisure Time Physical Health Resilience Social Support Energy manager  Language intact   Fund of knowledge is good        Treatment Plan/Recommendations:   Prior documentation reviewed   Major depression; manageable continue meds including lamictal     Discussed to look for positive things during the day to keep engaged Anxiety disorder: fair, discussed to add  activities, husband is supportive continue paxil    fibromyalgia;: flares up at times, is on gaba . Continue paxil    Insomnia; manageable on trazadone, will continue and continue sleep hygiene  Fu 4- 6 m.  [name], MD

## 2023-04-26 NOTE — Telephone Encounter (Signed)
 Copied from CRM [id number]. Topic: General - Other >> Apr 26, 2023  2:16 PM Brittney F wrote: Reason for CRM:   Patient is calling in reference to needing a letter excusing her from jury duty due to her current health condition; Patient informed the agent that this letter has been written up before.   Patient is scheduled to go to jury duty on May 22,2025.  Please call  patient to coordinate letter mailing or pick up.   Callback Number: [phone]

## 2023-05-07 ENCOUNTER — Encounter (INDEPENDENT_AMBULATORY_CARE_PROVIDER_SITE_OTHER): Payer: Self-pay | Admitting: Sports Medicine

## 2023-05-07 DIAGNOSIS — M5030 Other cervical disc degeneration, unspecified cervical region: Secondary | ICD-10-CM | POA: Diagnosis not present

## 2023-05-08 NOTE — Telephone Encounter (Signed)
 Spoke to patient and was told the jury duty excuse ( requesting to be permanent excuse letter ) is for  [employer].

## 2023-05-08 NOTE — Telephone Encounter (Signed)

## 2023-05-08 NOTE — Telephone Encounter (Signed)
 Letter written, patient can download

## 2023-05-08 NOTE — Telephone Encounter (Signed)
 Last read by [name] at 1:52PM on 05/08/2023.  Last read by Delbert Feathers at 1:51PM on 05/08/2023.

## 2023-05-31 ENCOUNTER — Other Ambulatory Visit: Payer: Self-pay | Admitting: Sports Medicine

## 2023-05-31 DIAGNOSIS — R0602 Shortness of breath: Secondary | ICD-10-CM

## 2023-06-13 DIAGNOSIS — M545 Low back pain, unspecified: Secondary | ICD-10-CM | POA: Diagnosis not present

## 2023-06-13 DIAGNOSIS — M5030 Other cervical disc degeneration, unspecified cervical region: Secondary | ICD-10-CM | POA: Diagnosis not present

## 2023-06-13 DIAGNOSIS — R110 Nausea: Secondary | ICD-10-CM | POA: Diagnosis not present

## 2023-06-13 DIAGNOSIS — G894 Chronic pain syndrome: Secondary | ICD-10-CM | POA: Diagnosis not present

## 2023-06-13 DIAGNOSIS — M797 Fibromyalgia: Secondary | ICD-10-CM | POA: Diagnosis not present

## 2023-06-13 DIAGNOSIS — M47816 Spondylosis without myelopathy or radiculopathy, lumbar region: Secondary | ICD-10-CM | POA: Diagnosis not present

## 2023-06-29 ENCOUNTER — Other Ambulatory Visit: Payer: Self-pay | Admitting: Sports Medicine

## 2023-06-29 DIAGNOSIS — E785 Hyperlipidemia, unspecified: Secondary | ICD-10-CM

## 2023-06-30 DIAGNOSIS — M47816 Spondylosis without myelopathy or radiculopathy, lumbar region: Secondary | ICD-10-CM | POA: Diagnosis not present

## 2023-06-30 DIAGNOSIS — M545 Low back pain, unspecified: Secondary | ICD-10-CM | POA: Diagnosis not present

## 2023-06-30 DIAGNOSIS — G894 Chronic pain syndrome: Secondary | ICD-10-CM | POA: Diagnosis not present

## 2023-06-30 DIAGNOSIS — M5030 Other cervical disc degeneration, unspecified cervical region: Secondary | ICD-10-CM | POA: Diagnosis not present

## 2023-06-30 DIAGNOSIS — Z9889 Other specified postprocedural states: Secondary | ICD-10-CM | POA: Diagnosis not present

## 2023-06-30 DIAGNOSIS — M797 Fibromyalgia: Secondary | ICD-10-CM | POA: Diagnosis not present

## 2023-07-13 DIAGNOSIS — M797 Fibromyalgia: Secondary | ICD-10-CM | POA: Diagnosis not present

## 2023-07-13 DIAGNOSIS — Z5181 Encounter for therapeutic drug level monitoring: Secondary | ICD-10-CM | POA: Diagnosis not present

## 2023-07-13 DIAGNOSIS — M5030 Other cervical disc degeneration, unspecified cervical region: Secondary | ICD-10-CM | POA: Diagnosis not present

## 2023-07-13 DIAGNOSIS — M545 Low back pain, unspecified: Secondary | ICD-10-CM | POA: Diagnosis not present

## 2023-07-13 DIAGNOSIS — R110 Nausea: Secondary | ICD-10-CM | POA: Diagnosis not present

## 2023-07-13 DIAGNOSIS — Z79899 Other long term (current) drug therapy: Secondary | ICD-10-CM | POA: Diagnosis not present

## 2023-07-13 DIAGNOSIS — Z9889 Other specified postprocedural states: Secondary | ICD-10-CM | POA: Diagnosis not present

## 2023-07-13 DIAGNOSIS — M47816 Spondylosis without myelopathy or radiculopathy, lumbar region: Secondary | ICD-10-CM | POA: Diagnosis not present

## 2023-07-13 DIAGNOSIS — G894 Chronic pain syndrome: Secondary | ICD-10-CM | POA: Diagnosis not present

## 2023-07-24 ENCOUNTER — Telehealth (HOSPITAL_COMMUNITY): Payer: Self-pay

## 2023-07-24 MED ORDER — LAMOTRIGINE 200 MG PO TABS: 200.0000 mg | ORAL_TABLET | Freq: Every day | ORAL | 0 refills | Status: DC

## 2023-07-24 NOTE — Telephone Encounter (Signed)
 Medication refill - [facility] [place] [facility] new Trazodone  90 day order which was last e-scribed on 04/24/23 and patient returns next on 10/23/23.

## 2023-07-26 ENCOUNTER — Telehealth (HOSPITAL_COMMUNITY): Payer: Self-pay

## 2023-07-26 DIAGNOSIS — F332 Major depressive disorder, recurrent severe without psychotic features: Secondary | ICD-10-CM

## 2023-07-26 MED ORDER — TRAZODONE HCL 100 MG PO TABS: ORAL_TABLET | ORAL | 0 refills | Status: DC

## 2023-07-26 NOTE — Telephone Encounter (Signed)
 Medication refill - [facility] requesting a new 90 day Trazodone  order, last provided 04/24/23. Patient's next appointment set for 10/23/23.

## 2023-08-16 DIAGNOSIS — M47816 Spondylosis without myelopathy or radiculopathy, lumbar region: Secondary | ICD-10-CM | POA: Diagnosis not present

## 2023-08-25 ENCOUNTER — Ambulatory Visit (INDEPENDENT_AMBULATORY_CARE_PROVIDER_SITE_OTHER): Admitting: Sports Medicine

## 2023-08-25 VITALS — BP 137/78 | HR 72 | Ht 65.0 in | Wt 231.0 lb

## 2023-08-25 DIAGNOSIS — K582 Mixed irritable bowel syndrome: Secondary | ICD-10-CM

## 2023-08-25 MED ORDER — HYOSCYAMINE SULFATE ER 0.375 MG PO TB12: 0.3750 mg | ORAL_TABLET | Freq: Two times a day (BID) | ORAL | 1 refills | Status: AC

## 2023-08-25 NOTE — Addendum Note (Signed)
 Addended by: CURTIS DEBBY PARAS on: 08/25/2023 03:55 PM   Modules accepted: Orders

## 2023-08-25 NOTE — Progress Notes (Signed)
    Procedures performed today:    None.  Independent interpretation of notes and tests performed by another provider:   None.  Brief History, Exam, Impression, and Recommendations:    Mixed irritable bowel syndrome 63 year old female, millimeter months of abdominal discomfort, left lower quadrant pressure, pain after eating then typically resolves after a large bowel movement, sometimes diarrhea often 4 times a day, no hematochezia, melena, hematemesis. CT scan historically has been negative, relevant blood work negative in 2016. About 6 months ago we discussed low FODMAP diet, Levbid . I do not think she is really done much of the above. At this point it is reasonable to gastroenterology to weigh in, we cannot entirely rule out a colitis type picture without direct visualization, we will refill Levbid  so she can continue it, I would like gastroenterology to weigh in, we will get some stool studies and cultures. Return to see me after her GI visit.  I spent 30 minutes of total time managing this patient today, this includes chart review, face to face, and non-face to face time.  ____________________________________________ [name], M.D., ABFM., CAQSM., AME. Primary Care and Sports Medicine [facility]  Adjunct Professor of [facility] Medicine  [employer]  Restaurant manager, fast food

## 2023-08-25 NOTE — Assessment & Plan Note (Signed)
 63 year old female, millimeter months of abdominal discomfort, left lower quadrant pressure, pain after eating then typically resolves after a large bowel movement, sometimes diarrhea often 4 times a day, no hematochezia, melena, hematemesis. CT scan historically has been negative, relevant blood work negative in 2016. About 6 months ago we discussed low FODMAP diet, Levbid . I do not think she is really done much of the above. At this point it is reasonable to gastroenterology to weigh in, we cannot entirely rule out a colitis type picture without direct visualization, we will refill Levbid  so she can continue it, I would like gastroenterology to weigh in, we will get some stool studies and cultures. Return to see me after her GI visit.

## 2023-08-26 LAB — CBC WITH DIFFERENTIAL/PLATELET
Basophils Absolute: 0.1 x10E3/uL (ref 0.0–0.2)
Basos: 2 %
EOS (ABSOLUTE): 0.1 x10E3/uL (ref 0.0–0.4)
Eos: 2 %
Hematocrit: 45.4 % (ref 34.0–46.6)
Hemoglobin: 15.5 g/dL (ref 11.1–15.9)
Immature Grans (Abs): 0 x10E3/uL (ref 0.0–0.1)
Immature Granulocytes: 0 %
Lymphocytes Absolute: 1.6 x10E3/uL (ref 0.7–3.1)
Lymphs: 29 %
MCH: 32 pg (ref 26.6–33.0)
MCHC: 34.1 g/dL (ref 31.5–35.7)
MCV: 94 fL (ref 79–97)
Monocytes Absolute: 0.4 x10E3/uL (ref 0.1–0.9)
Monocytes: 8 %
Neutrophils Absolute: 3.2 x10E3/uL (ref 1.4–7.0)
Neutrophils: 59 %
Platelets: 128 x10E3/uL — ABNORMAL LOW (ref 150–450)
RBC: 4.85 x10E6/uL (ref 3.77–5.28)
RDW: 13.1 % (ref 11.7–15.4)
WBC: 5.5 x10E3/uL (ref 3.4–10.8)

## 2023-08-26 LAB — COMPREHENSIVE METABOLIC PANEL WITH GFR
ALT: 30 IU/L (ref 0–32)
AST: 28 IU/L (ref 0–40)
Albumin: 4.2 g/dL (ref 3.9–4.9)
Alkaline Phosphatase: 72 IU/L (ref 44–121)
BUN/Creatinine Ratio: 11 — ABNORMAL LOW (ref 12–28)
BUN: 10 mg/dL (ref 8–27)
Bilirubin Total: 0.6 mg/dL (ref 0.0–1.2)
CO2: 25 mmol/L (ref 20–29)
Calcium: 8.9 mg/dL (ref 8.7–10.3)
Chloride: 105 mmol/L (ref 96–106)
Creatinine, Ser: 0.88 mg/dL (ref 0.57–1.00)
Globulin, Total: 1.9 g/dL (ref 1.5–4.5)
Glucose: 105 mg/dL — ABNORMAL HIGH (ref 70–99)
Potassium: 4.4 mmol/L (ref 3.5–5.2)
Sodium: 144 mmol/L (ref 134–144)
Total Protein: 6.1 g/dL (ref 6.0–8.5)
eGFR: 73 mL/min/1.73 (ref >59)

## 2023-08-26 LAB — LIPASE: Lipase: 28 U/L (ref 14–72)

## 2023-08-26 LAB — SEDIMENTATION RATE: Sed Rate: 7 mm/h (ref 0–40)

## 2023-08-26 LAB — TSH: TSH: 0.36 u[IU]/mL — ABNORMAL LOW (ref 0.450–4.500)

## 2023-08-28 DIAGNOSIS — M5030 Other cervical disc degeneration, unspecified cervical region: Secondary | ICD-10-CM | POA: Diagnosis not present

## 2023-08-28 DIAGNOSIS — M47816 Spondylosis without myelopathy or radiculopathy, lumbar region: Secondary | ICD-10-CM | POA: Diagnosis not present

## 2023-08-28 DIAGNOSIS — G894 Chronic pain syndrome: Secondary | ICD-10-CM | POA: Diagnosis not present

## 2023-08-28 DIAGNOSIS — M797 Fibromyalgia: Secondary | ICD-10-CM | POA: Diagnosis not present

## 2023-08-29 LAB — CELIAC DISEASE PANEL
Endomysial IgA: NEGATIVE
Immunoglobulin A, (IgA) QN, Serum: 181 mg/dL (ref 87–352)
t-Transglutaminase (tTG) IgA: <2 U/mL (ref 0–3)

## 2023-08-30 ENCOUNTER — Ambulatory Visit: Payer: Self-pay | Admitting: Family Medicine

## 2023-08-30 ENCOUNTER — Telehealth: Payer: Self-pay

## 2023-08-30 DIAGNOSIS — D696 Thrombocytopenia, unspecified: Secondary | ICD-10-CM

## 2023-08-30 NOTE — Telephone Encounter (Signed)
 In regards to your thyroid  hormone it looks like you are currently taking 175 mcg daily.  I would recommend taking half of a tab on Sundays and a whole tab the other 6 days a week.  Repeat this pattern each week and then we can recheck your level in 6 weeks.  If you do not like splitting tabs let me know I can always send over a different prescription for you to take on Sundays if needed.

## 2023-08-30 NOTE — Telephone Encounter (Signed)
 Forwarding message to Dr. Alvan covering Dr. Curtis please review patient lab results. She is requesting dosage change to thyroid  medication based on results.  Please send me a message once the results have been reviewed and I will contact the patient with this information and assist her in scheduling with a new provider.

## 2023-08-30 NOTE — Telephone Encounter (Signed)
 Copied from CRM #[id number]. Topic: Clinical - Medical Advice >> Aug 30, 2023  2:10 PM Miquel SAILOR wrote: Reason for CRM: levothyroxine  (SYNTHROID ) 175 MCG tablet [[id number]]-[id number] request change dosage medication.   Also  lab results to go over from 08/22   Request to go over who will take Dr. ONEIDA place and needs to go over what she is going thru due to health issue. Needs call back   [phone]

## 2023-08-30 NOTE — Telephone Encounter (Signed)
 See the result note....   Low platelet count can happen from decreased production to increase destruction or clumping of the sample.  Medicine can caus this too.  Any new medications? How long on the lamictal ? Any fevers?

## 2023-08-30 NOTE — Progress Notes (Signed)
 HI Chauncey, I am covering for Dr. ONEIDA.  Your platelets are still a little low similar to 7 months ago it looks like your baseline was technically in the normal range if we go back a year ago.  Has this happen before with your platelets?  Kidney function actually looks great this time.  Liver function also normal.  Your TSH is a little overly suppressed.  So that means you are actually taking too much thyroid  hormone.  So we do need to make an adjustment there.  He did do a panel to check you for celiac disease and that did come back negative which is great news.  Inflammatory markers are normal.  No sign of acute pancreatitis.  In regards to your thyroid  hormone it looks like you are currently taking 175 mcg daily.  I would recommend taking half of a tab on Sundays and a whole tab the other 6 days a week.  Repeat this pattern each week and then we can recheck your level in 6 weeks.  If you do not like splitting tabs let me know I can always send over a different prescription for you to take on Sundays if needed.

## 2023-08-30 NOTE — Telephone Encounter (Signed)
 Patient informed and will take 1/2 levothyroxine  175mcg on Sundays and full tablet all other days.   She is wanting to know if there is any concerns over her platelet levels as well in recent blood work? Spoke with patient. She is frustrated over need to seek a new provider after 16 years coming to our office.  She will only see an MD -due to her health concerns and age- I  recommended that she contact [facility] to schedule with Dr. Colette. She is going to research her and will reach out to schedule if she decides she will.

## 2023-09-05 ENCOUNTER — Telehealth (HOSPITAL_COMMUNITY): Payer: Self-pay | Admitting: *Deleted

## 2023-09-05 ENCOUNTER — Encounter: Payer: Self-pay | Admitting: Sports Medicine

## 2023-09-05 DIAGNOSIS — F332 Major depressive disorder, recurrent severe without psychotic features: Secondary | ICD-10-CM

## 2023-09-05 MED ORDER — PAROXETINE HCL 20 MG PO TABS: ORAL_TABLET | ORAL | 0 refills | Status: DC

## 2023-09-05 NOTE — Telephone Encounter (Signed)
 [facility] [place] [facility] for patient PARoxetine  (PAXIL ) 20 MG tablet. Medication was last filled 04-24-2023 with 180 tablets and 0 refills. Patient last appointment was 04/24/2023 and her follow up appt is 10-23-2023.

## 2023-09-05 NOTE — Addendum Note (Signed)
 Addended by: Jivan Symanski on: 09/05/2023 12:46 PM   Modules accepted: Orders

## 2023-09-06 NOTE — Telephone Encounter (Signed)
 Let's plan to recheck platelets in about 2-3 weeks.

## 2023-09-08 NOTE — Addendum Note (Signed)
 Addended by: Javarious Elsayed P on: 09/08/2023 10:22 AM   Modules accepted: Orders

## 2023-09-08 NOTE — Telephone Encounter (Signed)
 Patient informed and will come next week for platelet levels to be rechecked.

## 2023-10-18 ENCOUNTER — Telehealth: Payer: Self-pay

## 2023-10-18 NOTE — Telephone Encounter (Signed)
 Jennifer [name] states she has a referral to GI for a colonoscopy. I gave her the information. She will call to schedule.

## 2023-10-23 ENCOUNTER — Telehealth (HOSPITAL_COMMUNITY): Admitting: Psychiatry

## 2023-10-23 ENCOUNTER — Encounter (HOSPITAL_COMMUNITY): Payer: Self-pay

## 2023-11-13 ENCOUNTER — Encounter: Payer: Self-pay | Admitting: Urgent Care

## 2023-11-13 ENCOUNTER — Ambulatory Visit (INDEPENDENT_AMBULATORY_CARE_PROVIDER_SITE_OTHER): Admitting: Urgent Care

## 2023-11-13 VITALS — BP 143/84 | HR 78 | Ht 65.0 in | Wt 238.0 lb

## 2023-11-13 DIAGNOSIS — G2581 Restless legs syndrome: Secondary | ICD-10-CM | POA: Diagnosis not present

## 2023-11-13 DIAGNOSIS — L603 Nail dystrophy: Secondary | ICD-10-CM | POA: Diagnosis not present

## 2023-11-13 DIAGNOSIS — D696 Thrombocytopenia, unspecified: Secondary | ICD-10-CM

## 2023-11-13 DIAGNOSIS — Z23 Encounter for immunization: Secondary | ICD-10-CM | POA: Diagnosis not present

## 2023-11-13 DIAGNOSIS — E039 Hypothyroidism, unspecified: Secondary | ICD-10-CM

## 2023-11-13 DIAGNOSIS — R1013 Epigastric pain: Secondary | ICD-10-CM | POA: Diagnosis not present

## 2023-11-13 DIAGNOSIS — K582 Mixed irritable bowel syndrome: Secondary | ICD-10-CM

## 2023-11-13 DIAGNOSIS — M797 Fibromyalgia: Secondary | ICD-10-CM

## 2023-11-13 NOTE — Patient Instructions (Signed)
 [facility] [address] [place] [address] [phone]  Call the phone number above to schedule your colonoscopy.

## 2023-11-13 NOTE — Progress Notes (Unsigned)
 Established Patient Office Visit  Subjective:  Patient ID: Jennifer [name], female    DOB: 11-01-1960  Age: 63 y.o. MRN: [id number]  Chief Complaint  Patient presents with   transfer of care    HPI  Patient Active Problem List   Diagnosis Date Noted   Thrombocytopenia 01/31/2023   Chest pain 05/21/2021   Overactive bladder 03/08/2018   Perennial allergic rhinitis 02/05/2016   Restless leg syndrome 03/16/2015   Benign paroxysmal positional vertigo 03/16/2015   Mixed irritable bowel syndrome 01/16/2014   DDD (degenerative disc disease), cervical 09/11/2012   Menopausal syndrome 06/12/2012   Hyperlipidemia with target low density lipoprotein (LDL) cholesterol less than 100 mg/dL 97/95/7985   Annual physical exam 02/06/2012   Lumbar degenerative disc disease 01/25/2012   Bronchitis, mucopurulent recurrent (HCC) 03/17/2011   Discoid lupus    Hypothyroidism    Sleep apnea    Major depressive disorder, recurrent episode, severe (HCC) 03/10/2011   Female bladder prolapse 04/17/2006   Fibromyalgia with chronic fatigue 04/06/2006   Past Medical History:  Diagnosis Date   Alcohol abuse    Asthmatic bronchitis 2013   CFS (chronic fatigue syndrome)    Discoid lupus    Fibromyalgia    GERD (gastroesophageal reflux disease) 2013   Hyperlipidemia    Postartificial menopausal syndrome    Sleep apnea    Thyroid  disease    Past Surgical History:  Procedure Laterality Date   ABDOMINAL HYSTERECTOMY  09/04/2007   TAH with bladder sling   LUMBAR LAMINECTOMY  09/28/2017   L4-L5   NASAL SEPTUM SURGERY     orto surgery plate in leg     TUBAL LIGATION     bilateral   uvuloplasty     Social History   Tobacco Use   Smoking status: Former    Current packs/day: 0.30    Average packs/day: 0.3 packs/day for 36.0 years (10.8 ttl pk-yrs)    Types: Cigarettes   Smokeless tobacco: Never   Tobacco comments:    Started smoking at age 1.  Vaping Use   Vaping status: Never Used   Substance Use Topics   Alcohol use: No    Comment: quit 01-2006/prior alcoholic-in AA   Drug use: No      ROS: as noted in HPI  Objective:     BP (!) 143/84   Pulse 78   Ht 5' 5 (1.651 m)   Wt 238 lb (108 kg)   SpO2 91%   BMI 39.61 kg/m  BP Readings from Last 3 Encounters:  11/13/23 (!) 143/84  08/25/23 137/78  01/30/23 132/68   Wt Readings from Last 3 Encounters:  11/13/23 238 lb (108 kg)  08/25/23 231 lb (104.8 kg)  05/21/21 237 lb (107.5 kg)      Physical Exam   No results found for any visits on 11/13/23.  Last CBC Lab Results  Component Value Date   WBC 5.5 08/25/2023   HGB 15.5 08/25/2023   HCT 45.4 08/25/2023   MCV 94 08/25/2023   MCH 32.0 08/25/2023   RDW 13.1 08/25/2023   PLT 128 (L) 08/25/2023   Last metabolic panel Lab Results  Component Value Date   GLUCOSE 105 (H) 08/25/2023   NA 144 08/25/2023   K 4.4 08/25/2023   CL 105 08/25/2023   CO2 25 08/25/2023   BUN 10 08/25/2023   CREATININE 0.88 08/25/2023   EGFR 73 08/25/2023   CALCIUM  8.9 08/25/2023   PROT 6.1 08/25/2023   ALBUMIN 4.2  08/25/2023   LABGLOB 1.9 08/25/2023   BILITOT 0.6 08/25/2023   ALKPHOS 72 08/25/2023   AST 28 08/25/2023   ALT 30 08/25/2023   ANIONGAP 9 05/08/2014   Last lipids Lab Results  Component Value Date   CHOL 160 01/30/2023   HDL 70 01/30/2023   LDLCALC 73 01/30/2023   LDLDIRECT 183 (H) 02/06/2012   TRIG 92 01/30/2023   CHOLHDL 2.3 01/30/2023   Last hemoglobin A1c Lab Results  Component Value Date   HGBA1C 6.2 (H) 01/30/2023   Last thyroid  functions Lab Results  Component Value Date   TSH 0.360 (L) 08/25/2023   Last vitamin D  Lab Results  Component Value Date   VD25OH 34 12/22/2015   Last vitamin B12 and Folate Lab Results  Component Value Date   VITAMINB12 509 02/15/2021   FOLATE 16.1 07/15/2014      The 10-year ASCVD risk score (Arnett DK, et al., 2019) is: 5.5%  Assessment & Plan:  There are no diagnoses linked to this  encounter.   No follow-ups on file.   Benton LITTIE Gave, PA

## 2023-11-14 ENCOUNTER — Ambulatory Visit: Payer: Self-pay | Admitting: Urgent Care

## 2023-11-15 ENCOUNTER — Encounter (HOSPITAL_COMMUNITY): Payer: Self-pay | Admitting: Psychiatry

## 2023-11-15 ENCOUNTER — Telehealth (INDEPENDENT_AMBULATORY_CARE_PROVIDER_SITE_OTHER): Admitting: Psychiatry

## 2023-11-15 DIAGNOSIS — F5102 Adjustment insomnia: Secondary | ICD-10-CM

## 2023-11-15 DIAGNOSIS — F332 Major depressive disorder, recurrent severe without psychotic features: Secondary | ICD-10-CM | POA: Diagnosis not present

## 2023-11-15 DIAGNOSIS — M797 Fibromyalgia: Secondary | ICD-10-CM | POA: Diagnosis not present

## 2023-11-15 DIAGNOSIS — F411 Generalized anxiety disorder: Secondary | ICD-10-CM

## 2023-11-15 LAB — IRON,TIBC AND FERRITIN PANEL
Ferritin: 265 ng/mL — ABNORMAL HIGH (ref 15–150)
Iron Saturation: 37 % (ref 15–55)
Iron: 108 ug/dL (ref 27–139)
Total Iron Binding Capacity: 295 ug/dL (ref 250–450)
UIBC: 187 ug/dL (ref 118–369)

## 2023-11-15 LAB — TSH+FREE T4
Free T4: 1.28 ng/dL (ref 0.82–1.77)
TSH: 1.48 u[IU]/mL (ref 0.450–4.500)

## 2023-11-15 LAB — CBC WITH DIFFERENTIAL/PLATELET
Basophils Absolute: 0.1 x10E3/uL (ref 0.0–0.2)
Basos: 2 %
EOS (ABSOLUTE): 0.1 x10E3/uL (ref 0.0–0.4)
Eos: 1 %
Hematocrit: 45.5 % (ref 34.0–46.6)
Hemoglobin: 15.7 g/dL (ref 11.1–15.9)
Immature Grans (Abs): 0 x10E3/uL (ref 0.0–0.1)
Immature Granulocytes: 0 %
Lymphocytes Absolute: 1.8 x10E3/uL (ref 0.7–3.1)
Lymphs: 35 %
MCH: 32.8 pg (ref 26.6–33.0)
MCHC: 34.5 g/dL (ref 31.5–35.7)
MCV: 95 fL (ref 79–97)
Monocytes Absolute: 0.4 x10E3/uL (ref 0.1–0.9)
Monocytes: 7 %
Neutrophils Absolute: 2.9 x10E3/uL (ref 1.4–7.0)
Neutrophils: 55 %
Platelets: 150 x10E3/uL (ref 150–450)
RBC: 4.79 x10E6/uL (ref 3.77–5.28)
RDW: 13.3 % (ref 11.7–15.4)
WBC: 5.2 x10E3/uL (ref 3.4–10.8)

## 2023-11-15 LAB — H. PYLORI BREATH TEST

## 2023-11-15 LAB — B12 AND FOLATE PANEL
Folate: 9.5 ng/mL (ref >3.0)
Vitamin B-12: 471 pg/mL (ref 232–1245)

## 2023-11-15 MED ORDER — PAROXETINE HCL 20 MG PO TABS
ORAL_TABLET | ORAL | 0 refills | Status: AC
Start: 2023-11-15 — End: ?

## 2023-11-15 MED ORDER — LAMOTRIGINE 200 MG PO TABS: 200.0000 mg | ORAL_TABLET | Freq: Every day | ORAL | 0 refills | Status: AC

## 2023-11-15 MED ORDER — TRAZODONE HCL 100 MG PO TABS: ORAL_TABLET | ORAL | 0 refills | Status: AC

## 2023-11-15 NOTE — Progress Notes (Signed)
 Patient ID: Jennifer [name], female   DOB: [birth year], 63 y.o.   MRN: [id number]   [facility] Follow-up Outpatient Visit Telepsych visit Jennifer [name] 11/22/1960  Date:11/15/23   Virtual Visit via Video Note  I connected with Jennifer [name] on 11/15/23 at 12:30 PM EST by a video enabled telemedicine application and verified that I am speaking with the correct person using two identifiers.  Location: Patient: home Provider: home office   I discussed the limitations of evaluation and management by telemedicine and the availability of in person appointments. The patient expressed understanding and agreed to proceed.     I discussed the assessment and treatment plan with the patient. The patient was provided an opportunity to ask questions and all were answered. The patient agreed with the plan and demonstrated an understanding of the instructions.   The patient was advised to call back or seek an in-person evaluation if the symptoms worsen or if the condition fails to improve as anticipated.  I provided 18 minutes of non-face-to-face time during this encounter.        Chief complaint : depression follow up     HPI:    [name] is a 63  year old female with a diagnosis of major Depressive Disorder. 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   On evaluation today patient doing reasonable she still has fatigue because of her fibromyalgia and at times difficulty maintaining her sleep but other than that when family visits her it makes her more energized she is tolerating medications and describes her depression to be manageable there is no rash reported on Lamictal  she is also on gabapentin  for fibromyalgia  Takes trazodone  for sleep   Depression: baseline  Modifying factor:husband, puppy Severity baseline        Review of Systems  Cardiovascular:  Negative for chest pain.  Musculoskeletal:  Positive for myalgias.  Psychiatric/Behavioral:   Negative for substance abuse and suicidal ideas.      Physical Exam  Vitals reviewed.  Constitutional: She appears well-developed and well-nourished. No distress.  Skin: She is not diaphoretic.     Past Medical History: Reviewed.  Past Medical History   Diagnosis  Date     Discoid lupus      Thyroid  disease      Perimenopausal      Fibromyalgia      CFS (chronic fatigue syndrome)      Sleep apnea      Allergies: Reviewed.  Allergies   Allergen  Reactions     Acetaminophen      Pt states she is unable to take this bc of Hashimoto Thyroid      Hydrocodone-Acetaminophen      REACTION: agitation     Oxycodone      Hallucinating, sweating, itching    Current Medications: Reviewed.  Current Outpatient Medications on File Prior to Visit  Medication Sig Dispense Refill   albuterol  (PROVENTIL  HFA) 108 (90 Base) MCG/ACT inhaler Inhale 1-2 puffs into the lungs every 4 (four) hours as needed for wheezing or shortness of breath. 1 Inhaler 3   albuterol  (PROVENTIL ) (2.5 MG/3ML) 0.083% nebulizer solution Take 3 mLs (2.5 mg total) by nebulization every 6 (six) hours as needed for wheezing or shortness of breath. 75 mL 6   atorvastatin  (LIPITOR) 40 MG tablet TAKE 1 TABLET (40 MG TOTAL) BY MOUTH DAILY. NEEDS APPOINTMENT FOR FURTHER REFILLS. 90 tablet 1   Fluticasone -Umeclidin-Vilant (TRELEGY ELLIPTA ) 100-62.5-25 MCG/ACT AEPB INHALE 1 PUFF BY MOUTH  EVERY DAY 60 each 11   gabapentin  (NEURONTIN ) 800 MG tablet Take 1 tablet (800 mg total) by mouth 2 (two) times daily. 180 tablet 3   HYDROcodone-acetaminophen (NORCO) 10-325 MG tablet Take 1 tablet by mouth 4 (four) times daily as needed.     hyoscyamine  (LEVBID ) 0.375 MG 12 hr tablet Take 1 tablet (0.375 mg total) by mouth 2 (two) times daily. 180 tablet 1   ketotifen  (ZADITOR ) 0.025 % ophthalmic solution Place 1 drop into both eyes 2 (two) times daily. 5 mL 0   levothyroxine  (SYNTHROID ) 175 MCG tablet Take 1 tablet (175 mcg total) by mouth daily  before breakfast. Needs labs. 30 tablet 11   No current facility-administered medications on file prior to visit.       Family History: Reviewed.  Family History   Problem  Relation  Age of Onset     Hypertension  Father      Heart disease  Sister  37      heart attack      Depression  Brother      Cancer  Mother  43      colon    Psychiatric Specialty Exam: Appearance : casual  Eye contact fair  Speech:  Clear and Coherent and Normal Rate  Volume:  Normal  Mood: fair  Affect:   Thought Process:  clear  Orientation:  Full (Time, Place, and Person)  Thought Content:  WDL  Suicidal Thoughts:  No  Homicidal Thoughts:  No  Memory: somewhat subdued  Judgement:  Good  Insight:  Fair  Psychomotor Activity:  decreased  Concentration:  Fair  Recall:  Poor  Akathisia:  No  Handed:  Right  Fund of knowledge-average to above average  Language-Intact  AIMS (if indicated):     Assets:  Manufacturing Systems Engineer Desire for Improvement Financial Resources/Insurance Housing Intimacy Leisure Time Physical Health Resilience Social Support Energy Manager  Language intact   Fund of knowledge is good        Treatment Plan/Recommendations:  Prior documentation reviewed   Major depression; manageable continue medication moving Lamictal   Discussed to look for positive things during the day to keep engaged Anxiety disorder: Fluctuates but overall managing it has been supportive continue Paxil   fibromyalgia;: Manageable continue Paxil  she is also on gabapentin  at times for fibromyalgia flares up  insomnia; manageable with trazodone  continue sleep hygiene as well and reviewed   Medication reviewed questions answered and refill sent if due  Fu 4- 6 m.  Jackey Flight, MD

## 2023-12-25 ENCOUNTER — Other Ambulatory Visit: Payer: Self-pay

## 2023-12-25 DIAGNOSIS — E785 Hyperlipidemia, unspecified: Secondary | ICD-10-CM

## 2023-12-25 MED ORDER — ATORVASTATIN CALCIUM 40 MG PO TABS: 40.0000 mg | ORAL_TABLET | Freq: Every day | ORAL | 1 refills | Status: AC

## 2024-05-15 ENCOUNTER — Telehealth (HOSPITAL_COMMUNITY): Admitting: Psychiatry
